# Patient Record
Sex: Female | Born: 1940
Health system: Southern US, Community
[De-identification: ages and names within clinical notes are randomized; demographics above are authoritative.]

## PROBLEM LIST (undated history)

## (undated) ENCOUNTER — Emergency Department (HOSPITAL_BASED_OUTPATIENT_CLINIC_OR_DEPARTMENT_OTHER): Admission: EM | Payer: Medicare Other | Source: Home / Self Care

## (undated) DIAGNOSIS — F419 Anxiety disorder, unspecified: Secondary | ICD-10-CM

## (undated) DIAGNOSIS — F329 Major depressive disorder, single episode, unspecified: Secondary | ICD-10-CM

## (undated) DIAGNOSIS — E785 Hyperlipidemia, unspecified: Secondary | ICD-10-CM

## (undated) DIAGNOSIS — R238 Other skin changes: Secondary | ICD-10-CM

## (undated) DIAGNOSIS — D510 Vitamin B12 deficiency anemia due to intrinsic factor deficiency: Secondary | ICD-10-CM

## (undated) DIAGNOSIS — F32A Depression, unspecified: Secondary | ICD-10-CM

## (undated) DIAGNOSIS — R011 Cardiac murmur, unspecified: Secondary | ICD-10-CM

## (undated) DIAGNOSIS — M8580 Other specified disorders of bone density and structure, unspecified site: Secondary | ICD-10-CM

## (undated) DIAGNOSIS — H409 Unspecified glaucoma: Secondary | ICD-10-CM

## (undated) DIAGNOSIS — L578 Other skin changes due to chronic exposure to nonionizing radiation: Secondary | ICD-10-CM

## (undated) DIAGNOSIS — C439 Malignant melanoma of skin, unspecified: Secondary | ICD-10-CM

## (undated) DIAGNOSIS — H18519 Endothelial corneal dystrophy, unspecified eye: Secondary | ICD-10-CM

## (undated) DIAGNOSIS — IMO0001 Reserved for inherently not codable concepts without codable children: Secondary | ICD-10-CM

## (undated) DIAGNOSIS — L301 Dyshidrosis [pompholyx]: Secondary | ICD-10-CM

## (undated) HISTORY — DX: Other skin changes due to chronic exposure to nonionizing radiation: L57.8

## (undated) HISTORY — PX: MASTECTOMY: SHX3

## (undated) HISTORY — DX: Other skin changes: R23.8

## (undated) HISTORY — PX: CORNEAL TRANSPLANT: SHX108

## (undated) HISTORY — DX: Malignant melanoma of skin, unspecified: C43.9

## (undated) HISTORY — DX: Anxiety disorder, unspecified: F41.9

## (undated) HISTORY — DX: Dyshidrosis (pompholyx): L30.1

## (undated) HISTORY — DX: Other specified disorders of bone density and structure, unspecified site: M85.80

## (undated) HISTORY — DX: Depression, unspecified: F32.A

## (undated) HISTORY — DX: Reserved for inherently not codable concepts without codable children: IMO0001

## (undated) HISTORY — DX: Hyperlipidemia, unspecified: E78.5

## (undated) HISTORY — DX: Endothelial corneal dystrophy, unspecified eye: H18.519

## (undated) HISTORY — DX: Vitamin B12 deficiency anemia due to intrinsic factor deficiency: D51.0

## (undated) HISTORY — DX: Cardiac murmur, unspecified: R01.1

## (undated) HISTORY — PX: PLACEMENT OF BREAST IMPLANTS: SHX6334

## (undated) HISTORY — DX: Unspecified glaucoma: H40.9

---
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
## 1997-10-24 ENCOUNTER — Other Ambulatory Visit: Admission: RE | Admit: 1997-10-24 | Discharge: 1997-10-24 | Payer: Self-pay | Admitting: Obstetrics and Gynecology

## 1999-07-02 ENCOUNTER — Other Ambulatory Visit: Admission: RE | Admit: 1999-07-02 | Discharge: 1999-07-02 | Payer: Self-pay | Admitting: Obstetrics and Gynecology

## 2000-08-15 ENCOUNTER — Other Ambulatory Visit: Admission: RE | Admit: 2000-08-15 | Discharge: 2000-08-15 | Payer: Self-pay | Admitting: Obstetrics and Gynecology

## 2005-01-11 ENCOUNTER — Ambulatory Visit: Payer: Self-pay | Admitting: Family Medicine

## 2005-01-14 ENCOUNTER — Ambulatory Visit: Payer: Self-pay | Admitting: Family Medicine

## 2006-01-05 ENCOUNTER — Ambulatory Visit: Payer: Self-pay | Admitting: Family Medicine

## 2006-01-05 ENCOUNTER — Encounter: Admission: RE | Admit: 2006-01-05 | Discharge: 2006-01-05 | Payer: Self-pay | Admitting: Family Medicine

## 2006-01-19 ENCOUNTER — Ambulatory Visit: Payer: Self-pay | Admitting: Family Medicine

## 2006-01-19 LAB — CONVERTED CEMR LAB
ALT: 12 units/L (ref 0–40)
AST: 21 units/L (ref 0–37)
Albumin: 4.2 g/dL (ref 3.5–5.2)
Alkaline Phosphatase: 58 units/L (ref 39–117)
BUN: 15 mg/dL (ref 6–23)
Basophils Absolute: 0 10*3/uL (ref 0.0–0.1)
Basophils Relative: 0.4 % (ref 0.0–1.0)
CO2: 30 meq/L (ref 19–32)
Calcium: 9.7 mg/dL (ref 8.4–10.5)
Chloride: 108 meq/L (ref 96–112)
Chol/HDL Ratio, serum: 3.6
Cholesterol: 220 mg/dL (ref 0–200)
Creatinine, Ser: 0.9 mg/dL (ref 0.4–1.2)
Eosinophil percent: 3.5 % (ref 0.0–5.0)
GFR calc non Af Amer: 67 mL/min
Glomerular Filtration Rate, Af Am: 81 mL/min/{1.73_m2}
Glucose, Bld: 96 mg/dL (ref 70–99)
HCT: 42.9 % (ref 36.0–46.0)
HDL: 60.7 mg/dL (ref >39.0)
Hemoglobin: 14.1 g/dL (ref 12.0–15.0)
LDL DIRECT: 140.3 mg/dL
Lymphocytes Relative: 29.5 % (ref 12.0–46.0)
MCHC: 32.9 g/dL (ref 30.0–36.0)
MCV: 87.3 fL (ref 78.0–100.0)
Monocytes Absolute: 0.5 10*3/uL (ref 0.2–0.7)
Monocytes Relative: 8.5 % (ref 3.0–11.0)
Neutro Abs: 3.6 10*3/uL (ref 1.4–7.7)
Neutrophils Relative %: 58.1 % (ref 43.0–77.0)
Platelets: 263 10*3/uL (ref 150–400)
Potassium: 4.8 meq/L (ref 3.5–5.1)
RBC: 4.92 M/uL (ref 3.87–5.11)
RDW: 12.7 % (ref 11.5–14.6)
Sodium: 143 meq/L (ref 135–145)
TSH: 1.08 microintl units/mL (ref 0.35–5.50)
Total Bilirubin: 0.7 mg/dL (ref 0.3–1.2)
Total Protein: 7 g/dL (ref 6.0–8.3)
Triglyceride fasting, serum: 113 mg/dL (ref 0–149)
VLDL: 23 mg/dL (ref 0–40)
WBC: 6.1 10*3/uL (ref 4.5–10.5)

## 2006-01-26 ENCOUNTER — Ambulatory Visit: Payer: Self-pay | Admitting: Family Medicine

## 2006-04-15 ENCOUNTER — Emergency Department (HOSPITAL_COMMUNITY): Admission: EM | Admit: 2006-04-15 | Discharge: 2006-04-15 | Payer: Self-pay | Admitting: Emergency Medicine

## 2006-04-25 ENCOUNTER — Ambulatory Visit: Payer: Self-pay | Admitting: Family Medicine

## 2006-05-10 ENCOUNTER — Ambulatory Visit: Payer: Self-pay | Admitting: Internal Medicine

## 2006-05-16 ENCOUNTER — Encounter: Payer: Self-pay | Admitting: Family Medicine

## 2006-05-20 ENCOUNTER — Ambulatory Visit: Payer: Self-pay | Admitting: Family Medicine

## 2006-05-24 ENCOUNTER — Encounter: Payer: Self-pay | Admitting: Family Medicine

## 2006-10-24 ENCOUNTER — Telehealth (INDEPENDENT_AMBULATORY_CARE_PROVIDER_SITE_OTHER): Payer: Self-pay | Admitting: *Deleted

## 2007-05-03 ENCOUNTER — Ambulatory Visit: Payer: Self-pay | Admitting: Family Medicine

## 2007-05-03 DIAGNOSIS — L301 Dyshidrosis [pompholyx]: Secondary | ICD-10-CM

## 2007-05-03 DIAGNOSIS — M949 Disorder of cartilage, unspecified: Secondary | ICD-10-CM

## 2007-05-03 DIAGNOSIS — L578 Other skin changes due to chronic exposure to nonionizing radiation: Secondary | ICD-10-CM | POA: Insufficient documentation

## 2007-05-03 DIAGNOSIS — M899 Disorder of bone, unspecified: Secondary | ICD-10-CM | POA: Insufficient documentation

## 2007-05-03 HISTORY — DX: Dyshidrosis (pompholyx): L30.1

## 2007-05-03 LAB — CONVERTED CEMR LAB
ALT: 12 units/L (ref 0–35)
AST: 23 units/L (ref 0–37)
Albumin: 4.4 g/dL (ref 3.5–5.2)
Alkaline Phosphatase: 65 units/L (ref 39–117)
BUN: 14 mg/dL (ref 6–23)
Basophils Absolute: 0.1 10*3/uL (ref 0.0–0.1)
Basophils Relative: 1.2 % — ABNORMAL HIGH (ref 0.0–1.0)
Bilirubin Urine: NEGATIVE
Bilirubin, Direct: 0.1 mg/dL (ref 0.0–0.3)
Blood in Urine, dipstick: NEGATIVE
CO2: 31 meq/L (ref 19–32)
Calcium: 9.6 mg/dL (ref 8.4–10.5)
Chloride: 109 meq/L (ref 96–112)
Cholesterol: 247 mg/dL (ref 0–200)
Creatinine, Ser: 0.8 mg/dL (ref 0.4–1.2)
Direct LDL: 161.7 mg/dL
Eosinophils Absolute: 0.2 10*3/uL (ref 0.0–0.6)
Eosinophils Relative: 3.5 % (ref 0.0–5.0)
GFR calc Af Amer: 93 mL/min
GFR calc non Af Amer: 77 mL/min
Glucose, Bld: 87 mg/dL (ref 70–99)
Glucose, Urine, Semiquant: NEGATIVE
HCT: 41.6 % (ref 36.0–46.0)
HDL: 62.6 mg/dL (ref >39.0)
Hemoglobin: 13.7 g/dL (ref 12.0–15.0)
Ketones, urine, test strip: NEGATIVE
Lymphocytes Relative: 27.8 % (ref 12.0–46.0)
MCHC: 33 g/dL (ref 30.0–36.0)
MCV: 86.4 fL (ref 78.0–100.0)
Monocytes Absolute: 0.4 10*3/uL (ref 0.2–0.7)
Monocytes Relative: 8.5 % (ref 3.0–11.0)
Neutro Abs: 2.8 10*3/uL (ref 1.4–7.7)
Neutrophils Relative %: 59 % (ref 43.0–77.0)
Nitrite: NEGATIVE
Platelets: 260 10*3/uL (ref 150–400)
Potassium: 5.7 meq/L — ABNORMAL HIGH (ref 3.5–5.1)
Protein, U semiquant: NEGATIVE
RBC: 4.81 M/uL (ref 3.87–5.11)
RDW: 12.8 % (ref 11.5–14.6)
Sodium: 144 meq/L (ref 135–145)
Specific Gravity, Urine: 1.02
TSH: 1.65 microintl units/mL (ref 0.35–5.50)
Total Bilirubin: 0.7 mg/dL (ref 0.3–1.2)
Total CHOL/HDL Ratio: 3.9
Total Protein: 6.9 g/dL (ref 6.0–8.3)
Triglycerides: 95 mg/dL (ref 0–149)
Urobilinogen, UA: 0.2
VLDL: 19 mg/dL (ref 0–40)
WBC Urine, dipstick: NEGATIVE
WBC: 4.9 10*3/uL (ref 4.5–10.5)
pH: 7

## 2007-05-04 ENCOUNTER — Encounter: Payer: Self-pay | Admitting: Family Medicine

## 2007-05-04 ENCOUNTER — Ambulatory Visit: Payer: Self-pay | Admitting: Internal Medicine

## 2007-06-07 ENCOUNTER — Encounter: Payer: Self-pay | Admitting: Family Medicine

## 2007-06-12 ENCOUNTER — Ambulatory Visit: Payer: Self-pay | Admitting: Internal Medicine

## 2007-06-25 ENCOUNTER — Telehealth: Payer: Self-pay | Admitting: *Deleted

## 2007-06-26 ENCOUNTER — Ambulatory Visit: Payer: Self-pay | Admitting: Internal Medicine

## 2007-08-16 ENCOUNTER — Telehealth: Payer: Self-pay | Admitting: Internal Medicine

## 2007-08-16 ENCOUNTER — Ambulatory Visit: Payer: Self-pay | Admitting: Internal Medicine

## 2007-08-16 DIAGNOSIS — M79609 Pain in unspecified limb: Secondary | ICD-10-CM | POA: Insufficient documentation

## 2007-08-19 LAB — HM COLONOSCOPY: HM Colonoscopy: NORMAL

## 2008-03-12 ENCOUNTER — Telehealth: Payer: Self-pay | Admitting: Internal Medicine

## 2008-03-20 ENCOUNTER — Telehealth: Payer: Self-pay | Admitting: Internal Medicine

## 2008-05-06 ENCOUNTER — Ambulatory Visit: Payer: Self-pay | Admitting: Family Medicine

## 2008-05-27 ENCOUNTER — Ambulatory Visit: Payer: Self-pay | Admitting: Family Medicine

## 2008-05-27 DIAGNOSIS — H6120 Impacted cerumen, unspecified ear: Secondary | ICD-10-CM | POA: Insufficient documentation

## 2008-08-07 ENCOUNTER — Encounter: Payer: Self-pay | Admitting: Family Medicine

## 2009-08-18 LAB — HM MAMMOGRAPHY: HM Mammogram: NORMAL

## 2009-08-27 ENCOUNTER — Encounter: Payer: Self-pay | Admitting: Family Medicine

## 2009-09-23 ENCOUNTER — Ambulatory Visit: Payer: Self-pay | Admitting: Family Medicine

## 2009-09-23 ENCOUNTER — Other Ambulatory Visit: Admission: RE | Admit: 2009-09-23 | Discharge: 2009-09-23 | Payer: Self-pay | Admitting: Family Medicine

## 2009-09-23 LAB — CONVERTED CEMR LAB
Bilirubin Urine: NEGATIVE
Blood in Urine, dipstick: NEGATIVE
Glucose, Urine, Semiquant: NEGATIVE
Ketones, urine, test strip: NEGATIVE
Nitrite: NEGATIVE
Pap Smear: NEGATIVE
Protein, U semiquant: NEGATIVE
Specific Gravity, Urine: 1.02
Urobilinogen, UA: 0.2
WBC Urine, dipstick: NEGATIVE
pH: 6.5

## 2009-09-29 LAB — CONVERTED CEMR LAB
ALT: 15 units/L (ref 0–35)
AST: 23 units/L (ref 0–37)
Albumin: 4.3 g/dL (ref 3.5–5.2)
Alkaline Phosphatase: 66 units/L (ref 39–117)
BUN: 19 mg/dL (ref 6–23)
Basophils Absolute: 0 10*3/uL (ref 0.0–0.1)
Basophils Relative: 0.6 % (ref 0.0–3.0)
Bilirubin, Direct: 0.1 mg/dL (ref 0.0–0.3)
CO2: 29 meq/L (ref 19–32)
Calcium: 9.2 mg/dL (ref 8.4–10.5)
Chloride: 104 meq/L (ref 96–112)
Cholesterol: 239 mg/dL — ABNORMAL HIGH (ref 0–200)
Creatinine, Ser: 0.7 mg/dL (ref 0.4–1.2)
Direct LDL: 159.9 mg/dL
Eosinophils Absolute: 0.1 10*3/uL (ref 0.0–0.7)
Eosinophils Relative: 2.3 % (ref 0.0–5.0)
GFR calc non Af Amer: 93.01 mL/min (ref >60)
Glucose, Bld: 85 mg/dL (ref 70–99)
HCT: 41.1 % (ref 36.0–46.0)
HDL: 67.7 mg/dL (ref >39.00)
Hemoglobin: 13.8 g/dL (ref 12.0–15.0)
Lymphocytes Relative: 22.8 % (ref 12.0–46.0)
Lymphs Abs: 1.2 10*3/uL (ref 0.7–4.0)
MCHC: 33.5 g/dL (ref 30.0–36.0)
MCV: 86.1 fL (ref 78.0–100.0)
Monocytes Absolute: 0.5 10*3/uL (ref 0.1–1.0)
Monocytes Relative: 9.4 % (ref 3.0–12.0)
Neutro Abs: 3.4 10*3/uL (ref 1.4–7.7)
Neutrophils Relative %: 64.9 % (ref 43.0–77.0)
Platelets: 251 10*3/uL (ref 150.0–400.0)
Potassium: 4.2 meq/L (ref 3.5–5.1)
RBC: 4.77 M/uL (ref 3.87–5.11)
RDW: 13.8 % (ref 11.5–14.6)
Sodium: 143 meq/L (ref 135–145)
TSH: 1.67 microintl units/mL (ref 0.35–5.50)
Total Bilirubin: 0.5 mg/dL (ref 0.3–1.2)
Total CHOL/HDL Ratio: 4
Total Protein: 6.9 g/dL (ref 6.0–8.3)
Triglycerides: 89 mg/dL (ref 0.0–149.0)
VLDL: 17.8 mg/dL (ref 0.0–40.0)
WBC: 5.3 10*3/uL (ref 4.5–10.5)

## 2009-10-19 LAB — HM PAP SMEAR

## 2009-11-28 ENCOUNTER — Encounter: Payer: Self-pay | Admitting: Family Medicine

## 2009-12-23 ENCOUNTER — Ambulatory Visit: Payer: Self-pay | Admitting: Family Medicine

## 2009-12-26 DIAGNOSIS — L820 Inflamed seborrheic keratosis: Secondary | ICD-10-CM | POA: Insufficient documentation

## 2010-03-08 LAB — CONVERTED CEMR LAB
ALT: 22 units/L (ref 0–35)
AST: 27 units/L (ref 0–37)
Albumin: 4.1 g/dL (ref 3.5–5.2)
Alkaline Phosphatase: 73 units/L (ref 39–117)
BUN: 17 mg/dL (ref 6–23)
Basophils Absolute: 0 10*3/uL (ref 0.0–0.1)
Basophils Relative: 0.4 % (ref 0.0–3.0)
Bilirubin, Direct: 0.1 mg/dL (ref 0.0–0.3)
CO2: 30 meq/L (ref 19–32)
Calcium: 10 mg/dL (ref 8.4–10.5)
Chloride: 107 meq/L (ref 96–112)
Cholesterol: 229 mg/dL — ABNORMAL HIGH (ref 0–200)
Creatinine, Ser: 0.6 mg/dL (ref 0.4–1.2)
Direct LDL: 157.2 mg/dL
Eosinophils Absolute: 0.1 10*3/uL (ref 0.0–0.7)
Eosinophils Relative: 2.4 % (ref 0.0–5.0)
GFR calc non Af Amer: 106.08 mL/min (ref >60)
Glucose, Bld: 80 mg/dL (ref 70–99)
HCT: 40.9 % (ref 36.0–46.0)
HDL: 52 mg/dL (ref >39.00)
Hemoglobin: 14.2 g/dL (ref 12.0–15.0)
Lymphocytes Relative: 27.4 % (ref 12.0–46.0)
Lymphs Abs: 1.3 10*3/uL (ref 0.7–4.0)
MCHC: 34.6 g/dL (ref 30.0–36.0)
MCV: 85.2 fL (ref 78.0–100.0)
Monocytes Absolute: 0.3 10*3/uL (ref 0.1–1.0)
Monocytes Relative: 7.2 % (ref 3.0–12.0)
Neutro Abs: 3 10*3/uL (ref 1.4–7.7)
Neutrophils Relative %: 62.6 % (ref 43.0–77.0)
Platelets: 243 10*3/uL (ref 150.0–400.0)
Potassium: 4.5 meq/L (ref 3.5–5.1)
RBC: 4.8 M/uL (ref 3.87–5.11)
RDW: 12.2 % (ref 11.5–14.6)
Sodium: 144 meq/L (ref 135–145)
TSH: 1.08 microintl units/mL (ref 0.35–5.50)
Total Bilirubin: 0.6 mg/dL (ref 0.3–1.2)
Total CHOL/HDL Ratio: 4
Total Protein: 7.2 g/dL (ref 6.0–8.3)
Triglycerides: 92 mg/dL (ref 0.0–149.0)
VLDL: 18.4 mg/dL (ref 0.0–40.0)
WBC: 4.7 10*3/uL (ref 4.5–10.5)

## 2010-03-12 NOTE — Assessment & Plan Note (Signed)
Summary: mole removal on chest/cjr   Vital Signs:  Patient profile:   70 year old female Menstrual status:  postmenopausal BP sitting:   130 / 90  (left arm) Cuff size:   regular  Vitals Entered By: Alfred Levins, [name] (December 23, 2009 2:30 PM)  Procedure Note Last Tetanus: Historical (02/09/2000)  Mole Biopsy/Removal: Indication: rule out cancer Consent signed: yes  Procedure # 1: elliptical incision with 2 mm margin    Size (in cm): 0.7 x 0.7    Region: anterior    Location: chest-right    Instrument used: #15 blade    Anesthesia: 1% lidocaine w/epinephrine    Closure: cautery  Cleaned and prepped with: alcohol Wound dressing: bandaid  CC: remove mole between breast   CC:  remove mole between breast.  History of Present Illness: Carmen [name] is a 70 year old female, who comes in today for removal of a lesion on her anterior chest wall.  She said the skin cancers removed in the past  Allergies: 1)  ! Sulfa   Complete Medication List: 1)  Calcitrate 950 Mg Tabs (Calcium citrate) 2)  Adult Aspirin Low Strength 81 Mg Tbdp (Aspirin) 3)  Multivitamins Tabs (Multiple vitamin) .... Take one tab by mouth once daily 4)  Lumigan 0.03 % Soln (Bimatoprost) .... Use as directed 5)  Triamcinolone Acetonide 0.1 % Oint (Triamcinolone acetonide) .... Apply two times a day as needed 6)  Premarin 0.625 Mg/gm Crea (Estrogens, conjugated) .... Apply  2 x week  Other Orders: Shave Skin Lesion 0.6-1.0 cm/trunk/arm/leg (11301)   Orders Added: 1)  Shave Skin Lesion 0.6-1.0 cm/trunk/arm/leg [11301]

## 2010-03-12 NOTE — Consult Note (Signed)
Summary: [facility]  [facility]   Imported By: Maryln Gottron 07/25/2009 14:46:49  _____________________________________________________________________  External Attachment:    Type:   Image     Comment:   External Document

## 2010-03-12 NOTE — Miscellaneous (Signed)
Summary: mammogram update  Clinical Lists Changes  Observations: Added new observation of MAMMOGRAM: normal (08/13/2009 8:41)      Preventive Care Screening  Mammogram:    Date:  08/13/2009    Results:  normal
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
## 2010-03-12 NOTE — Miscellaneous (Signed)
Summary: flu vaccine  Clinical Lists Changes  Observations: Added new observation of FLU VAX: Historical (11/15/2009 11:00)      Immunization History:  Influenza Immunization History:    Influenza:  historical (11/15/2009)

## 2010-09-28 ENCOUNTER — Encounter: Payer: Self-pay | Admitting: Family Medicine

## 2010-10-19 ENCOUNTER — Other Ambulatory Visit: Payer: Self-pay | Admitting: Dermatology

## 2010-10-20 ENCOUNTER — Encounter: Payer: Self-pay | Admitting: Family Medicine

## 2010-10-20 ENCOUNTER — Ambulatory Visit (INDEPENDENT_AMBULATORY_CARE_PROVIDER_SITE_OTHER): Payer: Medicare Other | Admitting: Family Medicine

## 2010-10-20 VITALS — BP 124/84 | Temp 98.1°F | Ht 61.75 in | Wt 126.0 lb

## 2010-10-20 DIAGNOSIS — Z0000 Encounter for general adult medical examination without abnormal findings: Secondary | ICD-10-CM

## 2010-10-20 DIAGNOSIS — L578 Other skin changes due to chronic exposure to nonionizing radiation: Secondary | ICD-10-CM

## 2010-10-20 DIAGNOSIS — M899 Disorder of bone, unspecified: Secondary | ICD-10-CM

## 2010-10-20 DIAGNOSIS — Z23 Encounter for immunization: Secondary | ICD-10-CM

## 2010-10-20 DIAGNOSIS — Z136 Encounter for screening for cardiovascular disorders: Secondary | ICD-10-CM

## 2010-10-20 DIAGNOSIS — M949 Disorder of cartilage, unspecified: Secondary | ICD-10-CM

## 2010-10-20 DIAGNOSIS — H409 Unspecified glaucoma: Secondary | ICD-10-CM

## 2010-10-20 LAB — POCT URINALYSIS DIPSTICK
Bilirubin, UA: NEGATIVE
Glucose, UA: NEGATIVE
Ketones, UA: NEGATIVE
Nitrite, UA: POSITIVE
Protein, UA: NEGATIVE
Spec Grav, UA: 1.02
Urobilinogen, UA: 0.2
pH, UA: 7

## 2010-10-20 LAB — CBC WITH DIFFERENTIAL/PLATELET
Basophils Absolute: 0 10*3/uL (ref 0.0–0.1)
Basophils Relative: 0.9 % (ref 0.0–3.0)
Eosinophils Absolute: 0.1 10*3/uL (ref 0.0–0.7)
Eosinophils Relative: 2.5 % (ref 0.0–5.0)
HCT: 42.8 % (ref 36.0–46.0)
Hemoglobin: 14 g/dL (ref 12.0–15.0)
Lymphocytes Relative: 25.9 % (ref 12.0–46.0)
Lymphs Abs: 1.4 10*3/uL (ref 0.7–4.0)
MCHC: 32.8 g/dL (ref 30.0–36.0)
MCV: 86 fl (ref 78.0–100.0)
Monocytes Absolute: 0.5 10*3/uL (ref 0.1–1.0)
Monocytes Relative: 8.2 % (ref 3.0–12.0)
Neutro Abs: 3.5 10*3/uL (ref 1.4–7.7)
Neutrophils Relative %: 62.5 % (ref 43.0–77.0)
Platelets: 253 10*3/uL (ref 150.0–400.0)
RBC: 4.98 Mil/uL (ref 3.87–5.11)
RDW: 13.8 % (ref 11.5–14.6)
WBC: 5.6 10*3/uL (ref 4.5–10.5)

## 2010-10-20 LAB — TSH: TSH: 1.41 u[IU]/mL (ref 0.35–5.50)

## 2010-10-20 MED ORDER — ESTROGENS, CONJUGATED 0.625 MG/GM VA CREA: 0.6250 g | TOPICAL_CREAM | Freq: Every day | VAGINAL | Status: DC

## 2010-10-20 NOTE — Progress Notes (Signed)
Subjective:    Patient ID: Carmen [name], female    DOB: Apr 30, 1940, 70 y.o.   MRN: [id number]  HPIBecky is a 70 year old, married female, nonsmoker retired Engineer, civil (consulting).  G3, P3, who comes in today for a Medicare wellness examination because of the history of chronic sun damage, postmenopausal vaginal dryness, eczema, and a new diagnosis of glaucoma.  She takes an 81-mg baby aspirin daily, calcium, vitamin D.  Her last bone density was 2000 and 10 at which time she had osteopenia.  She got off the Fosamax at that point for about 3 years.  She did take Fosamax for 3 years plus.  Prior  She was using the Premarin vaginal cream, but stopped taking it because of the concern about breast cancer.  She's had bilateral mastectomies with implants.  When she was in her 9s.  Because of positive family history of breast cancer, and all the women in her family.  Uses Kenalog cream p.r.n. For eczema.  Recently started on drops by her ophthalmologist for glaucoma.  She gets routine eye care, hearing normal, regular dental care, BSE monthly, recent mammogram normal, colonoscopy, normal, tetanus, 2002, booster today, flu shot today, information given on shingles, Pneumovax 2009,  Activities of daily living, normal.  She walks on a daily basis.  Cognitive function, normal.  On how safety reviewed.  No issues identified, no guns in the house, she does have a healthcare power of attorney and living will.  Recent skin exam two lesions removed by dermatologist path is pending.  We also discussed Pap smears every 3 years addition, she has been asymptomatic, and no history of any cervical or uterine or ovarian problems.    Review of Systems  Constitutional: Negative.   HENT: Negative.   Eyes: Negative.   Respiratory: Negative.   Cardiovascular: Negative.   Gastrointestinal: Negative.   Genitourinary: Negative.   Musculoskeletal: Negative.   Neurological: Negative.   Hematological: Negative.     Psychiatric/Behavioral: Negative.        Objective:   Physical Exam  Constitutional: She appears well-developed and well-nourished.  HENT:  Head: Normocephalic and atraumatic.  Right Ear: External ear normal.  Left Ear: External ear normal.  Nose: Nose normal.  Mouth/Throat: Oropharynx is clear and moist.  Eyes: EOM are normal. Pupils are equal, round, and reactive to light.  Neck: Normal range of motion. Neck supple. No thyromegaly present.  Cardiovascular: Normal rate, regular rhythm, normal heart sounds and intact distal pulses.  Exam reveals no gallop and no friction rub.   No murmur heard. Pulmonary/Chest: Effort normal and breath sounds normal.  Abdominal: Soft. Bowel sounds are normal. She exhibits no distension and no mass. There is no tenderness. There is no rebound.  Genitourinary: Vagina normal and uterus normal. Guaiac negative stool. No vaginal discharge found.       Bilateral breast exam shows the scars at the sixth clock positions bilaterally from previous implants implants are palpable.  No palpable masses  Musculoskeletal: Normal range of motion.  Lymphadenopathy:    She has no cervical adenopathy.  Neurological: She is alert. She has normal reflexes. No cranial nerve deficit. She exhibits normal muscle tone. Coordination normal.  Skin: Skin is warm and dry.       Total body skin exam shows no abnormal appearing lesions.  Psychiatric: She has a normal mood and affect. Her behavior is normal. Judgment and thought content normal.          Assessment & Plan:  Healthy female.  History of osteopenia.  Repeat bone density.  History of glaucoma followed by ophthalmologist.  Vaginal dryness uses Premarin vaginal cream.  History of eczema.  Triamcinolone cream p.r.n.  Return in one year, sooner if any problems

## 2010-10-21 LAB — LIPID PANEL
Cholesterol: 244 mg/dL — ABNORMAL HIGH (ref 0–200)
HDL: 72.9 mg/dL (ref >39.00)
Total CHOL/HDL Ratio: 3
Triglycerides: 57 mg/dL (ref 0.0–149.0)
VLDL: 11.4 mg/dL (ref 0.0–40.0)

## 2010-10-21 LAB — BASIC METABOLIC PANEL
BUN: 21 mg/dL (ref 6–23)
CO2: 26 mEq/L (ref 19–32)
Calcium: 9.1 mg/dL (ref 8.4–10.5)
Chloride: 106 mEq/L (ref 96–112)
Creatinine, Ser: 0.8 mg/dL (ref 0.4–1.2)
GFR: 81.4 mL/min (ref >60.00)
Glucose, Bld: 104 mg/dL — ABNORMAL HIGH (ref 70–99)
Potassium: 4.4 mEq/L (ref 3.5–5.1)
Sodium: 142 mEq/L (ref 135–145)

## 2010-10-21 LAB — HEPATIC FUNCTION PANEL
ALT: 12 U/L (ref 0–35)
AST: 23 U/L (ref 0–37)
Albumin: 4.5 g/dL (ref 3.5–5.2)
Alkaline Phosphatase: 70 U/L (ref 39–117)
Bilirubin, Direct: 0 mg/dL (ref 0.0–0.3)
Total Bilirubin: 0.6 mg/dL (ref 0.3–1.2)
Total Protein: 7.4 g/dL (ref 6.0–8.3)

## 2010-10-21 LAB — LDL CHOLESTEROL, DIRECT: Direct LDL: 170.4 mg/dL

## 2010-10-22 LAB — VITAMIN D 1,25 DIHYDROXY
Vitamin D 1, 25 (OH)2 Total: 63 pg/mL (ref 18–72)
Vitamin D2 1, 25 (OH)2: <8 pg/mL
Vitamin D3 1, 25 (OH)2: 63 pg/mL

## 2010-12-14 ENCOUNTER — Ambulatory Visit (INDEPENDENT_AMBULATORY_CARE_PROVIDER_SITE_OTHER)
Admission: RE | Admit: 2010-12-14 | Discharge: 2010-12-14 | Disposition: A | Discharge status: Home / Self Care | Payer: Medicare Other | Source: Ambulatory Visit | Attending: Family Medicine | Admitting: Family Medicine

## 2010-12-14 DIAGNOSIS — M899 Disorder of bone, unspecified: Secondary | ICD-10-CM

## 2010-12-14 DIAGNOSIS — M949 Disorder of cartilage, unspecified: Secondary | ICD-10-CM

## 2010-12-22 ENCOUNTER — Encounter: Payer: Self-pay | Admitting: Family Medicine

## 2011-03-09 DIAGNOSIS — Z09 Encounter for follow-up examination after completed treatment for conditions other than malignant neoplasm: Secondary | ICD-10-CM | POA: Diagnosis not present

## 2011-03-09 DIAGNOSIS — N63 Unspecified lump in unspecified breast: Secondary | ICD-10-CM | POA: Diagnosis not present

## 2011-03-09 DIAGNOSIS — Z803 Family history of malignant neoplasm of breast: Secondary | ICD-10-CM | POA: Diagnosis not present

## 2011-03-10 DIAGNOSIS — H18509 Unspecified hereditary corneal dystrophies, unspecified eye: Secondary | ICD-10-CM | POA: Diagnosis not present

## 2011-03-10 DIAGNOSIS — H4010X0 Unspecified open-angle glaucoma, stage unspecified: Secondary | ICD-10-CM | POA: Diagnosis not present

## 2011-08-09 ENCOUNTER — Other Ambulatory Visit: Payer: Self-pay | Admitting: Dermatology

## 2011-08-09 DIAGNOSIS — L821 Other seborrheic keratosis: Secondary | ICD-10-CM | POA: Diagnosis not present

## 2011-08-09 DIAGNOSIS — L570 Actinic keratosis: Secondary | ICD-10-CM | POA: Diagnosis not present

## 2011-08-09 DIAGNOSIS — C44721 Squamous cell carcinoma of skin of unspecified lower limb, including hip: Secondary | ICD-10-CM | POA: Diagnosis not present

## 2011-08-09 DIAGNOSIS — L578 Other skin changes due to chronic exposure to nonionizing radiation: Secondary | ICD-10-CM | POA: Diagnosis not present

## 2011-08-09 DIAGNOSIS — D0470 Carcinoma in situ of skin of unspecified lower limb, including hip: Secondary | ICD-10-CM | POA: Diagnosis not present

## 2011-08-09 DIAGNOSIS — D239 Other benign neoplasm of skin, unspecified: Secondary | ICD-10-CM | POA: Diagnosis not present

## 2011-08-09 DIAGNOSIS — D485 Neoplasm of uncertain behavior of skin: Secondary | ICD-10-CM | POA: Diagnosis not present

## 2011-09-13 DIAGNOSIS — Z09 Encounter for follow-up examination after completed treatment for conditions other than malignant neoplasm: Secondary | ICD-10-CM | POA: Diagnosis not present

## 2011-09-13 DIAGNOSIS — N6019 Diffuse cystic mastopathy of unspecified breast: Secondary | ICD-10-CM | POA: Diagnosis not present

## 2011-09-14 DIAGNOSIS — H52209 Unspecified astigmatism, unspecified eye: Secondary | ICD-10-CM | POA: Diagnosis not present

## 2011-09-14 DIAGNOSIS — H4011X0 Primary open-angle glaucoma, stage unspecified: Secondary | ICD-10-CM | POA: Diagnosis not present

## 2011-09-14 DIAGNOSIS — H18519 Endothelial corneal dystrophy, unspecified eye: Secondary | ICD-10-CM | POA: Diagnosis not present

## 2011-09-14 DIAGNOSIS — H409 Unspecified glaucoma: Secondary | ICD-10-CM | POA: Diagnosis not present

## 2011-10-25 ENCOUNTER — Ambulatory Visit (INDEPENDENT_AMBULATORY_CARE_PROVIDER_SITE_OTHER): Payer: Medicare Other | Admitting: Family Medicine

## 2011-10-25 ENCOUNTER — Encounter: Payer: Self-pay | Admitting: Family Medicine

## 2011-10-25 ENCOUNTER — Other Ambulatory Visit (HOSPITAL_COMMUNITY)
Admission: RE | Admit: 2011-10-25 | Discharge: 2011-10-25 | Disposition: A | Discharge status: Home / Self Care | Payer: Medicare Other | Source: Ambulatory Visit | Attending: Family Medicine | Admitting: Family Medicine

## 2011-10-25 ENCOUNTER — Other Ambulatory Visit: Payer: Self-pay | Admitting: Dermatology

## 2011-10-25 VITALS — BP 120/80 | Temp 98.1°F | Ht 61.75 in | Wt 130.0 lb

## 2011-10-25 DIAGNOSIS — Z23 Encounter for immunization: Secondary | ICD-10-CM | POA: Diagnosis not present

## 2011-10-25 DIAGNOSIS — Z0000 Encounter for general adult medical examination without abnormal findings: Secondary | ICD-10-CM | POA: Diagnosis not present

## 2011-10-25 DIAGNOSIS — L570 Actinic keratosis: Secondary | ICD-10-CM | POA: Diagnosis not present

## 2011-10-25 DIAGNOSIS — D0470 Carcinoma in situ of skin of unspecified lower limb, including hip: Secondary | ICD-10-CM | POA: Diagnosis not present

## 2011-10-25 DIAGNOSIS — L905 Scar conditions and fibrosis of skin: Secondary | ICD-10-CM | POA: Diagnosis not present

## 2011-10-25 DIAGNOSIS — D485 Neoplasm of uncertain behavior of skin: Secondary | ICD-10-CM | POA: Diagnosis not present

## 2011-10-25 DIAGNOSIS — L578 Other skin changes due to chronic exposure to nonionizing radiation: Secondary | ICD-10-CM

## 2011-10-25 DIAGNOSIS — L821 Other seborrheic keratosis: Secondary | ICD-10-CM | POA: Diagnosis not present

## 2011-10-25 DIAGNOSIS — H409 Unspecified glaucoma: Secondary | ICD-10-CM

## 2011-10-25 DIAGNOSIS — L819 Disorder of pigmentation, unspecified: Secondary | ICD-10-CM | POA: Diagnosis not present

## 2011-10-25 DIAGNOSIS — L301 Dyshidrosis [pompholyx]: Secondary | ICD-10-CM | POA: Diagnosis not present

## 2011-10-25 DIAGNOSIS — Z85828 Personal history of other malignant neoplasm of skin: Secondary | ICD-10-CM | POA: Diagnosis not present

## 2011-10-25 DIAGNOSIS — C4359 Malignant melanoma of other part of trunk: Secondary | ICD-10-CM | POA: Diagnosis not present

## 2011-10-25 DIAGNOSIS — D239 Other benign neoplasm of skin, unspecified: Secondary | ICD-10-CM | POA: Diagnosis not present

## 2011-10-25 DIAGNOSIS — Z124 Encounter for screening for malignant neoplasm of cervix: Secondary | ICD-10-CM | POA: Insufficient documentation

## 2011-10-25 LAB — BASIC METABOLIC PANEL
BUN: 22 mg/dL (ref 6–23)
CO2: 27 mEq/L (ref 19–32)
Calcium: 9.1 mg/dL (ref 8.4–10.5)
Chloride: 105 mEq/L (ref 96–112)
Creatinine, Ser: 0.8 mg/dL (ref 0.4–1.2)
GFR: 76.43 mL/min (ref >60.00)
Glucose, Bld: 96 mg/dL (ref 70–99)
Potassium: 4.3 mEq/L (ref 3.5–5.1)
Sodium: 140 mEq/L (ref 135–145)

## 2011-10-25 LAB — HEPATIC FUNCTION PANEL
ALT: 17 U/L (ref 0–35)
AST: 23 U/L (ref 0–37)
Albumin: 4.3 g/dL (ref 3.5–5.2)
Alkaline Phosphatase: 71 U/L (ref 39–117)
Bilirubin, Direct: 0 mg/dL (ref 0.0–0.3)
Total Bilirubin: 0.7 mg/dL (ref 0.3–1.2)
Total Protein: 7.3 g/dL (ref 6.0–8.3)

## 2011-10-25 LAB — POCT URINALYSIS DIPSTICK
Bilirubin, UA: NEGATIVE
Blood, UA: NEGATIVE
Glucose, UA: NEGATIVE
Ketones, UA: NEGATIVE
Nitrite, UA: POSITIVE
Protein, UA: NEGATIVE
Spec Grav, UA: 1.015
Urobilinogen, UA: 0.2
pH, UA: 7

## 2011-10-25 MED ORDER — TRIAMCINOLONE ACETONIDE 0.1 % EX CREA: 1.0000 "application " | TOPICAL_CREAM | Freq: Two times a day (BID) | CUTANEOUS | Status: DC

## 2011-10-25 NOTE — Patient Instructions (Addendum)
Continue your good health habits  Continue the SPF 50+ screenings  Return in one year sooner if any problems  I would recommend that you and your daughter and  go for genetic counseling because of the family history of breast cancer

## 2011-10-25 NOTE — Progress Notes (Signed)
  Subjective:    Patient ID: Carmen [name], female    DOB: 07/11/1940, 71 y.o.   MRN: [id number]  HPI Kriste Basque is a 71 year old married female nonsmoker retired Engineer, civil (consulting) who comes in today for a Medicare wellness examination  She has a history of mild glaucoma for which she uses eyedrops from her ophthalmologist  Is uses a steroid cream when necessary for some dyshidrotic eczema  And she gets a thorough skin exam by her dermatologist yearly because of chronic sun damage.  She gets routine eye care, dental care, BSE monthly, and you mammography, recent colonoscopy normal, tetanus 2012, Pneumovax 2009, seasonal flu shot today, information given on shingles cognitive function normal she walks on a regular basis home health safety reviewed no issues identified, no guns in the house, she does have a health care power of attorney and living well  She had a bilateral mastectomy with implants years ago because all the women in her family have breast cancer. We discussed her 2 daughters in terms of risk and the bracca gene . I recommend that she and her daughter go for genetic counseling   Review of Systems  Constitutional: Negative.   HENT: Negative.   Eyes: Negative.   Respiratory: Negative.   Cardiovascular: Negative.   Gastrointestinal: Negative.   Genitourinary: Negative.   Musculoskeletal: Negative.   Neurological: Negative.   Hematological: Negative.   Psychiatric/Behavioral: Negative.        Objective:   Physical Exam  Constitutional: She appears well-developed and well-nourished.  HENT:  Head: Normocephalic and atraumatic.  Right Ear: External ear normal.  Left Ear: External ear normal.  Nose: Nose normal.  Mouth/Throat: Oropharynx is clear and moist.  Eyes: EOM are normal. Pupils are equal, round, and reactive to light.  Neck: Normal range of motion. Neck supple. No thyromegaly present.  Cardiovascular: Normal rate, regular rhythm, normal heart sounds and intact distal pulses.   Exam reveals no gallop and no friction rub.   No murmur heard. Pulmonary/Chest: Effort normal and breath sounds normal.  Abdominal: Soft. Bowel sounds are normal. She exhibits no distension and no mass. There is no tenderness. There is no rebound.  Genitourinary: Vagina normal and uterus normal. Guaiac negative stool. No vaginal discharge found.       Bilateral breast exam shows scars from previous mastectomy and implants no palpable masses except the implants  Musculoskeletal: Normal range of motion.  Lymphadenopathy:    She has no cervical adenopathy.  Neurological: She is alert. She has normal reflexes. No cranial nerve deficit. She exhibits normal muscle tone. Coordination normal.  Skin: Skin is warm and dry.  Psychiatric: She has a normal mood and affect. Her behavior is normal. Judgment and thought content normal.          Assessment & Plan:  Healthy female  Chronic sun damage continue sunscreens and regular dermatologic followup  Glaucoma continue eyedrops  Status post mastectomy and implants because of family history of breast cancer recommend genetic counseling for she and her daughter

## 2011-11-03 ENCOUNTER — Telehealth: Payer: Self-pay | Admitting: Family Medicine

## 2011-11-03 NOTE — Telephone Encounter (Signed)
Pt would like pap and blood work results

## 2011-11-15 ENCOUNTER — Other Ambulatory Visit: Payer: Self-pay | Admitting: Dermatology

## 2011-11-15 DIAGNOSIS — D485 Neoplasm of uncertain behavior of skin: Secondary | ICD-10-CM | POA: Diagnosis not present

## 2011-11-15 DIAGNOSIS — C4359 Malignant melanoma of other part of trunk: Secondary | ICD-10-CM | POA: Diagnosis not present

## 2012-02-18 DIAGNOSIS — L259 Unspecified contact dermatitis, unspecified cause: Secondary | ICD-10-CM | POA: Diagnosis not present

## 2012-02-18 DIAGNOSIS — Z85828 Personal history of other malignant neoplasm of skin: Secondary | ICD-10-CM | POA: Diagnosis not present

## 2012-02-18 DIAGNOSIS — D235 Other benign neoplasm of skin of trunk: Secondary | ICD-10-CM | POA: Diagnosis not present

## 2012-02-18 DIAGNOSIS — L821 Other seborrheic keratosis: Secondary | ICD-10-CM | POA: Diagnosis not present

## 2012-02-18 DIAGNOSIS — L909 Atrophic disorder of skin, unspecified: Secondary | ICD-10-CM | POA: Diagnosis not present

## 2012-02-18 DIAGNOSIS — L919 Hypertrophic disorder of the skin, unspecified: Secondary | ICD-10-CM | POA: Diagnosis not present

## 2012-02-18 DIAGNOSIS — D239 Other benign neoplasm of skin, unspecified: Secondary | ICD-10-CM | POA: Diagnosis not present

## 2012-02-18 DIAGNOSIS — D1801 Hemangioma of skin and subcutaneous tissue: Secondary | ICD-10-CM | POA: Diagnosis not present

## 2012-02-18 DIAGNOSIS — L819 Disorder of pigmentation, unspecified: Secondary | ICD-10-CM | POA: Diagnosis not present

## 2012-03-14 DIAGNOSIS — H4011X0 Primary open-angle glaucoma, stage unspecified: Secondary | ICD-10-CM | POA: Diagnosis not present

## 2012-03-14 DIAGNOSIS — H18519 Endothelial corneal dystrophy, unspecified eye: Secondary | ICD-10-CM | POA: Diagnosis not present

## 2012-03-14 DIAGNOSIS — H409 Unspecified glaucoma: Secondary | ICD-10-CM | POA: Diagnosis not present

## 2012-05-11 ENCOUNTER — Other Ambulatory Visit: Payer: Self-pay | Admitting: Dermatology

## 2012-05-11 DIAGNOSIS — Z85828 Personal history of other malignant neoplasm of skin: Secondary | ICD-10-CM | POA: Diagnosis not present

## 2012-05-11 DIAGNOSIS — C4441 Basal cell carcinoma of skin of scalp and neck: Secondary | ICD-10-CM | POA: Diagnosis not present

## 2012-05-11 DIAGNOSIS — L819 Disorder of pigmentation, unspecified: Secondary | ICD-10-CM | POA: Diagnosis not present

## 2012-05-11 DIAGNOSIS — L821 Other seborrheic keratosis: Secondary | ICD-10-CM | POA: Diagnosis not present

## 2012-05-11 DIAGNOSIS — D235 Other benign neoplasm of skin of trunk: Secondary | ICD-10-CM | POA: Diagnosis not present

## 2012-06-14 ENCOUNTER — Other Ambulatory Visit: Payer: Self-pay | Admitting: Dermatology

## 2012-06-14 DIAGNOSIS — L821 Other seborrheic keratosis: Secondary | ICD-10-CM | POA: Diagnosis not present

## 2012-06-14 DIAGNOSIS — Z85828 Personal history of other malignant neoplasm of skin: Secondary | ICD-10-CM | POA: Diagnosis not present

## 2012-06-14 DIAGNOSIS — L819 Disorder of pigmentation, unspecified: Secondary | ICD-10-CM | POA: Diagnosis not present

## 2012-06-14 DIAGNOSIS — C4359 Malignant melanoma of other part of trunk: Secondary | ICD-10-CM | POA: Diagnosis not present

## 2012-06-14 DIAGNOSIS — L723 Sebaceous cyst: Secondary | ICD-10-CM | POA: Diagnosis not present

## 2012-06-14 DIAGNOSIS — D239 Other benign neoplasm of skin, unspecified: Secondary | ICD-10-CM | POA: Diagnosis not present

## 2012-06-14 DIAGNOSIS — D485 Neoplasm of uncertain behavior of skin: Secondary | ICD-10-CM | POA: Diagnosis not present

## 2012-06-14 DIAGNOSIS — Z85820 Personal history of malignant melanoma of skin: Secondary | ICD-10-CM | POA: Diagnosis not present

## 2012-06-14 DIAGNOSIS — D1801 Hemangioma of skin and subcutaneous tissue: Secondary | ICD-10-CM | POA: Diagnosis not present

## 2012-07-04 ENCOUNTER — Other Ambulatory Visit: Payer: Self-pay | Admitting: Dermatology

## 2012-07-04 DIAGNOSIS — D485 Neoplasm of uncertain behavior of skin: Secondary | ICD-10-CM | POA: Diagnosis not present

## 2012-07-04 DIAGNOSIS — C4359 Malignant melanoma of other part of trunk: Secondary | ICD-10-CM | POA: Diagnosis not present

## 2012-09-13 DIAGNOSIS — Z853 Personal history of malignant neoplasm of breast: Secondary | ICD-10-CM | POA: Diagnosis not present

## 2012-09-15 ENCOUNTER — Other Ambulatory Visit: Payer: Self-pay | Admitting: Dermatology

## 2012-09-15 DIAGNOSIS — L821 Other seborrheic keratosis: Secondary | ICD-10-CM | POA: Diagnosis not present

## 2012-09-15 DIAGNOSIS — D485 Neoplasm of uncertain behavior of skin: Secondary | ICD-10-CM | POA: Diagnosis not present

## 2012-09-15 DIAGNOSIS — Z85820 Personal history of malignant melanoma of skin: Secondary | ICD-10-CM | POA: Diagnosis not present

## 2012-09-15 DIAGNOSIS — L570 Actinic keratosis: Secondary | ICD-10-CM | POA: Diagnosis not present

## 2012-09-15 DIAGNOSIS — L723 Sebaceous cyst: Secondary | ICD-10-CM | POA: Diagnosis not present

## 2012-09-15 DIAGNOSIS — C44519 Basal cell carcinoma of skin of other part of trunk: Secondary | ICD-10-CM | POA: Diagnosis not present

## 2012-09-15 DIAGNOSIS — Z85828 Personal history of other malignant neoplasm of skin: Secondary | ICD-10-CM | POA: Diagnosis not present

## 2012-09-15 DIAGNOSIS — D239 Other benign neoplasm of skin, unspecified: Secondary | ICD-10-CM | POA: Diagnosis not present

## 2012-10-25 ENCOUNTER — Encounter: Payer: Medicare Other | Admitting: Family Medicine

## 2012-11-15 DIAGNOSIS — H18519 Endothelial corneal dystrophy, unspecified eye: Secondary | ICD-10-CM | POA: Diagnosis not present

## 2012-11-15 DIAGNOSIS — H409 Unspecified glaucoma: Secondary | ICD-10-CM | POA: Diagnosis not present

## 2012-11-15 DIAGNOSIS — H4011X0 Primary open-angle glaucoma, stage unspecified: Secondary | ICD-10-CM | POA: Diagnosis not present

## 2012-11-15 DIAGNOSIS — H2510 Age-related nuclear cataract, unspecified eye: Secondary | ICD-10-CM | POA: Diagnosis not present

## 2012-11-30 DIAGNOSIS — Z23 Encounter for immunization: Secondary | ICD-10-CM | POA: Diagnosis not present

## 2012-12-12 DIAGNOSIS — L988 Other specified disorders of the skin and subcutaneous tissue: Secondary | ICD-10-CM | POA: Insufficient documentation

## 2012-12-21 DIAGNOSIS — L570 Actinic keratosis: Secondary | ICD-10-CM | POA: Diagnosis not present

## 2012-12-21 DIAGNOSIS — D239 Other benign neoplasm of skin, unspecified: Secondary | ICD-10-CM | POA: Diagnosis not present

## 2012-12-21 DIAGNOSIS — Z85820 Personal history of malignant melanoma of skin: Secondary | ICD-10-CM | POA: Diagnosis not present

## 2012-12-21 DIAGNOSIS — L723 Sebaceous cyst: Secondary | ICD-10-CM | POA: Diagnosis not present

## 2012-12-21 DIAGNOSIS — Z85828 Personal history of other malignant neoplasm of skin: Secondary | ICD-10-CM | POA: Diagnosis not present

## 2012-12-21 DIAGNOSIS — L819 Disorder of pigmentation, unspecified: Secondary | ICD-10-CM | POA: Diagnosis not present

## 2012-12-28 ENCOUNTER — Encounter: Payer: Medicare Other | Admitting: Family Medicine

## 2013-03-08 ENCOUNTER — Ambulatory Visit (INDEPENDENT_AMBULATORY_CARE_PROVIDER_SITE_OTHER): Payer: Medicare Other | Admitting: Family Medicine

## 2013-03-08 ENCOUNTER — Encounter: Payer: Self-pay | Admitting: Family Medicine

## 2013-03-08 VITALS — BP 120/80 | Temp 98.5°F | Ht 63.5 in | Wt 130.0 lb

## 2013-03-08 DIAGNOSIS — L578 Other skin changes due to chronic exposure to nonionizing radiation: Secondary | ICD-10-CM | POA: Diagnosis not present

## 2013-03-08 DIAGNOSIS — H6120 Impacted cerumen, unspecified ear: Secondary | ICD-10-CM

## 2013-03-08 DIAGNOSIS — R079 Chest pain, unspecified: Secondary | ICD-10-CM

## 2013-03-08 DIAGNOSIS — Z23 Encounter for immunization: Secondary | ICD-10-CM | POA: Diagnosis not present

## 2013-03-08 DIAGNOSIS — R0789 Other chest pain: Secondary | ICD-10-CM

## 2013-03-08 DIAGNOSIS — H409 Unspecified glaucoma: Secondary | ICD-10-CM

## 2013-03-08 DIAGNOSIS — Z0000 Encounter for general adult medical examination without abnormal findings: Secondary | ICD-10-CM | POA: Diagnosis not present

## 2013-03-08 DIAGNOSIS — R071 Chest pain on breathing: Secondary | ICD-10-CM

## 2013-03-08 LAB — POCT URINALYSIS DIPSTICK
Bilirubin, UA: NEGATIVE
Blood, UA: NEGATIVE
Glucose, UA: NEGATIVE
Ketones, UA: NEGATIVE
Nitrite, UA: POSITIVE
Protein, UA: NEGATIVE
Spec Grav, UA: 1.02
Urobilinogen, UA: 0.2
pH, UA: 7

## 2013-03-08 LAB — CBC WITH DIFFERENTIAL/PLATELET
Basophils Absolute: 0 10*3/uL (ref 0.0–0.1)
Basophils Relative: 0.5 % (ref 0.0–3.0)
Eosinophils Absolute: 0.1 10*3/uL (ref 0.0–0.7)
Eosinophils Relative: 2.8 % (ref 0.0–5.0)
HCT: 44.7 % (ref 36.0–46.0)
Hemoglobin: 14.4 g/dL (ref 12.0–15.0)
Lymphocytes Relative: 30.8 % (ref 12.0–46.0)
Lymphs Abs: 1.6 10*3/uL (ref 0.7–4.0)
MCHC: 32.2 g/dL (ref 30.0–36.0)
MCV: 86.9 fl (ref 78.0–100.0)
Monocytes Absolute: 0.4 10*3/uL (ref 0.1–1.0)
Monocytes Relative: 7.8 % (ref 3.0–12.0)
Neutro Abs: 3 10*3/uL (ref 1.4–7.7)
Neutrophils Relative %: 58.1 % (ref 43.0–77.0)
Platelets: 255 10*3/uL (ref 150.0–400.0)
RBC: 5.15 Mil/uL — ABNORMAL HIGH (ref 3.87–5.11)
RDW: 13.3 % (ref 11.5–14.6)
WBC: 5.1 10*3/uL (ref 4.5–10.5)

## 2013-03-08 LAB — LDL CHOLESTEROL, DIRECT: Direct LDL: 165.1 mg/dL

## 2013-03-08 LAB — BASIC METABOLIC PANEL
BUN: 16 mg/dL (ref 6–23)
CO2: 27 mEq/L (ref 19–32)
Calcium: 9.1 mg/dL (ref 8.4–10.5)
Chloride: 106 mEq/L (ref 96–112)
Creatinine, Ser: 0.8 mg/dL (ref 0.4–1.2)
GFR: 70.93 mL/min (ref >60.00)
Glucose, Bld: 85 mg/dL (ref 70–99)
Potassium: 3.7 mEq/L (ref 3.5–5.1)
Sodium: 140 mEq/L (ref 135–145)

## 2013-03-08 LAB — LIPID PANEL
Cholesterol: 245 mg/dL — ABNORMAL HIGH (ref 0–200)
HDL: 67.8 mg/dL (ref >39.00)
Total CHOL/HDL Ratio: 4
Triglycerides: 77 mg/dL (ref 0.0–149.0)
VLDL: 15.4 mg/dL (ref 0.0–40.0)

## 2013-03-08 LAB — TSH: TSH: 0.69 u[IU]/mL (ref 0.35–5.50)

## 2013-03-08 NOTE — Progress Notes (Signed)
Pre visit review using our clinic review tool, if applicable. No additional management support is needed unless otherwise documented below in the visit note. 

## 2013-03-08 NOTE — Patient Instructions (Signed)
Continue current medications  I will set you up a time to see Dr. Loralie Champagne for cardiac evaluation. Your symptoms are most consistent with what we call chest wall pain however I want to get a cardiology consult to be sure

## 2013-03-08 NOTE — Progress Notes (Signed)
   Subjective:    Patient ID: Carmen [name], female    DOB: [birth year], 73 y.o.   MRN: [id number]  HPI Carmen [name] is a 73 year old married female nonsmoker who comes in today for a Medicare wellness examination  She's always been in excellent health he said no chronic health problems except glaucoma and skin cancer. She sees her ophthalmologist every 6 months and is on eyedrops daily.  She's had 2 melanomas and some recent squamous cell carcinomas removed by her dermatologist  She states about 3 days ago she was walking and had a burning sensation in her left upper anterior chest wall. She described it as a 5 on a scale of 1-10. It did not radiate she did not get short of breath or just what. Over that day she had 6 episodes over 2 hour period of time. Some of which were related to exertion summer which were not. She's had no history of any cardiac or pulmonary problems in the past. Her blood pressure blood sugar lipids have all been normal and she is a nonsmoker. One brother in good health mother had angina but she was a smoker.  Cognitive function normal she walks on a regular basis home health safety reviewed no issues identified, no guns in the house, she does have a health care power of attorney and living well  Vaccinations updated   Review of Systems  Constitutional: Negative.   HENT: Negative.   Eyes: Negative.   Respiratory: Negative.   Cardiovascular: Positive for chest pain.  Gastrointestinal: Negative.   Genitourinary: Negative.   Musculoskeletal: Negative.   Neurological: Negative.   Psychiatric/Behavioral: Negative.        Objective:   Physical Exam  Nursing note and vitals reviewed. Constitutional: She is oriented to person, place, and time. She appears well-developed and well-nourished.  HENT:  Head: Normocephalic and atraumatic.  Right Ear: External ear normal.  Left Ear: External ear normal.  Nose: Nose normal.  Mouth/Throat: Oropharynx is clear and moist.    Eyes: EOM are normal. Pupils are equal, round, and reactive to light.  Neck: Normal range of motion. Neck supple. No thyromegaly present.  Cardiovascular: Normal rate, regular rhythm, normal heart sounds and intact distal pulses.  Exam reveals no gallop and no friction rub.   No murmur heard. No carotid aortic bruits peripheral pulses 2+ and symmetrical  Pulmonary/Chest: Effort normal and breath sounds normal.  Abdominal: Soft. Bowel sounds are normal. She exhibits no distension and no mass. There is no tenderness. There is no rebound.  Genitourinary:  Bilateral breast exam normal bilateral implants recent mammogram normal  Musculoskeletal: Normal range of motion.  Lymphadenopathy:    She has no cervical adenopathy.  Neurological: She is alert and oriented to person, place, and time. She has normal reflexes. No cranial nerve deficit. She exhibits normal muscle tone. Coordination normal.  Skin: Skin is warm and dry.  Total body skin exam she has light skin and light eyes and to instruct her back from previous melanoma removal scars left lower extremity from recent squamous cell carcinoma removal  Psychiatric: She has a normal mood and affect. Her behavior is normal. Judgment and thought content normal.          Assessment & Plan:  Healthy female  Glaucoma continue eyedrops and followed by ophthalmology  History of melanoma in squamous cell carcinoma continue followup by dermatology  Atypical chest pain plan cardiac consult will for stress test

## 2013-03-10 ENCOUNTER — Emergency Department (HOSPITAL_COMMUNITY)
Admission: EM | Admit: 2013-03-10 | Discharge: 2013-03-11 | Disposition: A | Discharge status: Home / Self Care | Payer: Medicare Other | Attending: Emergency Medicine | Admitting: Emergency Medicine

## 2013-03-10 ENCOUNTER — Emergency Department (HOSPITAL_COMMUNITY): Payer: Medicare Other

## 2013-03-10 ENCOUNTER — Encounter (HOSPITAL_COMMUNITY): Payer: Self-pay | Admitting: Emergency Medicine

## 2013-03-10 DIAGNOSIS — Z8739 Personal history of other diseases of the musculoskeletal system and connective tissue: Secondary | ICD-10-CM | POA: Insufficient documentation

## 2013-03-10 DIAGNOSIS — R079 Chest pain, unspecified: Secondary | ICD-10-CM

## 2013-03-10 DIAGNOSIS — Z872 Personal history of diseases of the skin and subcutaneous tissue: Secondary | ICD-10-CM | POA: Diagnosis not present

## 2013-03-10 DIAGNOSIS — Z8669 Personal history of other diseases of the nervous system and sense organs: Secondary | ICD-10-CM | POA: Diagnosis not present

## 2013-03-10 DIAGNOSIS — IMO0002 Reserved for concepts with insufficient information to code with codable children: Secondary | ICD-10-CM | POA: Insufficient documentation

## 2013-03-10 DIAGNOSIS — R072 Precordial pain: Secondary | ICD-10-CM | POA: Insufficient documentation

## 2013-03-10 DIAGNOSIS — R002 Palpitations: Secondary | ICD-10-CM | POA: Diagnosis not present

## 2013-03-10 DIAGNOSIS — R011 Cardiac murmur, unspecified: Secondary | ICD-10-CM | POA: Insufficient documentation

## 2013-03-10 LAB — POCT I-STAT TROPONIN I
Troponin i, poc: 0 ng/mL (ref 0.00–0.08)
Troponin i, poc: 0.01 ng/mL (ref 0.00–0.08)

## 2013-03-10 LAB — CBC
HCT: 44.3 % (ref 36.0–46.0)
Hemoglobin: 14.9 g/dL (ref 12.0–15.0)
MCH: 29 pg (ref 26.0–34.0)
MCHC: 33.6 g/dL (ref 30.0–36.0)
MCV: 86.4 fL (ref 78.0–100.0)
Platelets: 239 10*3/uL (ref 150–400)
RBC: 5.13 MIL/uL — ABNORMAL HIGH (ref 3.87–5.11)
RDW: 13 % (ref 11.5–15.5)
WBC: 6.6 10*3/uL (ref 4.0–10.5)

## 2013-03-10 LAB — BASIC METABOLIC PANEL
BUN: 21 mg/dL (ref 6–23)
CO2: 24 mEq/L (ref 19–32)
Calcium: 9.7 mg/dL (ref 8.4–10.5)
Chloride: 103 mEq/L (ref 96–112)
Creatinine, Ser: 0.82 mg/dL (ref 0.50–1.10)
GFR calc Af Amer: 81 mL/min — ABNORMAL LOW (ref >90)
GFR calc non Af Amer: 70 mL/min — ABNORMAL LOW (ref >90)
Glucose, Bld: 87 mg/dL (ref 70–99)
Potassium: 4.1 mEq/L (ref 3.7–5.3)
Sodium: 144 mEq/L (ref 137–147)

## 2013-03-10 MED ORDER — ASPIRIN EC 325 MG PO TBEC
325.0000 mg | DELAYED_RELEASE_TABLET | Freq: Once | ORAL | Status: AC
Start: 2013-03-10 — End: 2013-03-10
  Administered 2013-03-10: 325 mg via ORAL
  Filled 2013-03-10: qty 1

## 2013-03-10 NOTE — ED Notes (Signed)
Patient states that today she felt discomfort in the back of her neck, checked her HR and felt that it was irregular, and also felt lightheaded

## 2013-03-10 NOTE — ED Notes (Addendum)
Pt reports she began to feel lightheaded this afternoon and she felt her PR was irregular. Then she began to have a tightness and discomfort in the back of her neck. Reports the discomfort has persisted since onset. She reports similar episodes 2 and 4 days ago. A&Ox4, breathing easily

## 2013-03-10 NOTE — ED Provider Notes (Signed)
I saw and evaluated the patient, reviewed the resident's note and I agree with the findings and plan.  EKG Interpretation    Date/Time:  Saturday March 10 2013 18:06:45 EST Ventricular Rate:  94 PR Interval:  156 QRS Duration: 84 QT Interval:  372 QTC Calculation: 465 R Axis:   56 Text Interpretation:  Normal sinus rhythm Septal infarct , age undetermined Abnormal ECG No old for comparison Confirmed by Deshone Lyssy  DO, Mykle Pascua ([id number]) on 03/10/2013 7:55:59 PM            Pt is a 73 y.o. female with no significant past medical history who presents emergency department with an episode of palpitations, jaw discomfort and lightheadedness that started at 5:30 PM while at rest and lasted for approximately 1 hour. She states that 4 days ago she did have an episode of chest pain that spontaneously resolved after several minutes. She denies any chest pain tonight. No shortness of breath, nausea or vomiting. She states she did feel like she was going to pass out. 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 She does not have a history of arrhythmia. She is on anticoagulation. Currently she is asymptomatic. On exam, patient is hemodynamically stable, in normal sinus rhythm, normal heart and lung sounds, abdomen soft nontender. Patient has no risk factors for ACS or pulmonary embolus other than age. She denies a history of heavy alcohol use. Patient's initial labs are unremarkable including troponin. We'll repeat a second troponin 6 hours after the onset of symptoms. Have offered admission but patient reports she would like to be discharged home. She states she was seen by her primary care physician Dr. Sherren Mocha at 5 and annual checkup several days ago and is scheduled to have a stress test as an outpatient.   Repeat troponin is negative. She has been hemodynamically stable without arrhythmia on the monitor. She would prefer discharge home. She has an outpatient stress test scheduled next  week. We'll have her discuss with her PCP as well as she may need an outpatient echocardiogram. Have given strict return precautions.  [place], DO 03/11/13 0207

## 2013-03-10 NOTE — ED Provider Notes (Signed)
CSN: [id number]     Arrival date & time 03/10/13  1803 History   First MD Initiated Contact with Patient 03/10/13 1823     Chief Complaint  Patient presents with  . Chest Pain   (Consider location/radiation/quality/duration/timing/severity/associated sxs/prior Treatment) Patient is a 73 y.o. female presenting with chest pain. The history is provided by the patient and the spouse.  Chest Pain Pain location:  Substernal area Pain quality: aching and dull   Radiates to: left and right neck. Pain radiates to the back: no   Pain severity:  Mild Onset quality:  Gradual Duration:  4 days Timing:  Intermittent Progression:  Waxing and waning Chronicity:  Recurrent Context: not breathing, no intercourse and not raising an arm   Relieved by:  Nothing Worsened by:  Nothing tried Ineffective treatments:  None tried Associated symptoms: palpitations   Associated symptoms: no abdominal pain, no diaphoresis, no dizziness, no fatigue, no fever, no headache, no lower extremity edema, no nausea, no shortness of breath and not vomiting   Risk factors: no birth control, no coronary artery disease, no diabetes mellitus, no high cholesterol, no hypertension, not obese and no prior DVT/PE     73 year old female with the chief complaint of chest discomfort. Patient said she felt was the first time about 4 days ago. Feels like it's at the center of her chest and radiates up to the bilateral sides of her neck. Pain mostly stays up in her neck. Patient denies any shortness of breath patient denies any cough fever or chills. Patient denies any abdominal pain patient denies any association with food.  Patient denies any trauma. Patient denies any worsening or relieving factors. Patient was seen by her PCP 2 days ago for similar pain. 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 Patient having some PVCs. Patient denies any history of hypertension, diabetes, smoking, hyperlipidemia, family  history. Patient exercises 3 times a week. Patient has not noted any chest discomfort while exercising doing cardiovascular workout.  Patient's PCP recommended doing an outpatient stress test.  Past Medical History  Diagnosis Date  . GLAUCOMA NOS 09/23/2009  . CERUMEN IMPACTION 05/27/2008  . DERMATITIS DUE TO SOLAR RADIATION 05/03/2007  . Inflamed seborrheic keratosis 12/26/2009  . Dyshidrosis 05/03/2007  . FOOT PAIN, LEFT 08/16/2007  . OSTEOPENIA 05/03/2007  . Tinnitus   . Heart murmur   . Bleb     X 4   Past Surgical History  Procedure Laterality Date  . Breast surgery      mastectomy  . Breast surgery      implants   Family History  Problem Relation Age of Onset  . Cancer Other     breast  . Stroke Other     stroke  . Heart disease Other    History  Substance Use Topics  . Smoking status: Never Smoker   . Smokeless tobacco: Not on file  . Alcohol Use:    OB History   Grav Para Term Preterm Abortions TAB SAB Ect Mult Living                 Review of Systems  Constitutional: Negative for fever, chills, diaphoresis and fatigue.  HENT: Negative for congestion and rhinorrhea.   Eyes: Negative for redness and visual disturbance.  Respiratory: Negative for shortness of breath and wheezing.   Cardiovascular: Positive for chest pain and palpitations.  Gastrointestinal: Negative for nausea, vomiting and abdominal pain.  Genitourinary: Negative for dysuria  and urgency.  Musculoskeletal: Negative for arthralgias and myalgias.  Skin: Negative for pallor and wound.  Neurological: Negative for dizziness and headaches.    Allergies  Sulfonamide derivatives  Home Medications   Current Outpatient Rx  Name  Route  Sig  Dispense  Refill  . LUMIGAN 0.01 % SOLN               . Multiple Vitamins-Minerals (MULTIVITAL PO)   Oral   Take by mouth.           . triamcinolone cream (KENALOG) 0.1 %   Topical   Apply 1 application topically 2 (two) times daily.   30 g   3     BP 126/79  Pulse 81  Temp(Src) 98.2 F (36.8 C) (Oral)  Resp 20  Ht 5\' 3"  (1.6 m)  Wt 128 lb 14.4 oz (58.469 kg)  BMI 22.84 kg/m2  SpO2 97% Physical Exam  Nursing note and vitals reviewed. Constitutional: She is oriented to person, place, and time. She appears well-developed and well-nourished. No distress.  HENT:  Head: Normocephalic and atraumatic.  Her to palpation about PE attachment of the sternocleidomastoid bilaterally to the occiput  Eyes: EOM are normal. Pupils are equal, round, and reactive to light.  Neck: Normal range of motion. Neck supple.  Cardiovascular: Normal rate and regular rhythm.  Exam reveals no gallop and no friction rub.   No murmur heard. Pulmonary/Chest: Effort normal. She has no wheezes. She has no rales.  Abdominal: Soft. She exhibits no distension. There is no tenderness. There is no rebound and no guarding.  Musculoskeletal: She exhibits no edema and no tenderness.  Neurological: She is alert and oriented to person, place, and time.  Skin: Skin is warm and dry. She is not diaphoretic.  Psychiatric: She has a normal mood and affect. Her behavior is normal.    ED Course  Procedures (including critical care time) Labs Review Labs Reviewed  CBC - Abnormal; Notable for the following:    RBC 5.13 (*)    All other components within normal limits  BASIC METABOLIC PANEL - Abnormal; Notable for the following:    GFR calc non Af Amer 70 (*)    GFR calc Af Amer 81 (*)    All other components within normal limits  POCT I-STAT TROPONIN I  POCT I-STAT TROPONIN I   Imaging Review Dg Chest 2 View  03/10/2013   CLINICAL DATA:  Posterior neck pain, chest pain  EXAM: CHEST  2 VIEW  COMPARISON:  DG RIBS UNILATERAL W/CHEST*R* dated 04/15/2006; DG CHEST 2 VIEW dated 01/05/2006  FINDINGS: The heart size and mediastinal contours are within normal limits. Both lungs are clear. The visualized skeletal structures are unremarkable.  IMPRESSION: No active cardiopulmonary  disease.   Electronically Signed   By: Kathreen Devoid   On: 03/10/2013 19:41    EKG Interpretation    Date/Time:  Saturday March 10 2013 18:06:45 EST Ventricular Rate:  94 PR Interval:  156 QRS Duration: 84 QT Interval:  372 QTC Calculation: 465 R Axis:   56 Text Interpretation:  Normal sinus rhythm Septal infarct , age undetermined Abnormal ECG No old for comparison Confirmed by WARD  DO, KRISTEN ([id number]) on 03/10/2013 7:55:59 PM            MDM   1. Chest pain, unspecified      73 year old female very low risk for ACS. Doubt PE by history and physical exam, we'll do serial troponins here.  First  troponin negative patient with no chest pain.   Repeat 3 hour troponin also negative patient still endorsing the chest pain appears well nontoxic. We'll discharge the patient home she will follow up with her PCP. Visual return for sudden worsening chest pain.  11:58 PM:  I have discussed the diagnosis/risks/treatment options with the patient and family and believe the pt to be eligible for discharge home to follow-up with PCP. We also discussed returning to the ED immediately if new or worsening sx occur. We discussed the sx which are most concerning (e.g., worsening chest pain, syncope) that necessitate immediate return. Medications administered to the patient during their visit and any new prescriptions provided to the patient are listed below.  Medications given during this visit Medications  aspirin EC tablet 325 mg (325 mg Oral Given 03/10/13 1946)    New Prescriptions   No medications on file     Deno Etienne, MD 03/10/13 2358

## 2013-03-10 NOTE — ED Notes (Signed)
Dr. Floyd at bedside. 

## 2013-03-10 NOTE — Discharge Instructions (Signed)
Follow up with your PCP, return for sudden worsening pain.   Chest Pain (Nonspecific) It is often hard to give a specific diagnosis for the cause of chest pain. There is always a chance that your pain could be related to something serious, such as a heart attack or a blood clot in the lungs. You need to follow up with your caregiver for further evaluation. CAUSES   Heartburn.  Pneumonia or bronchitis.  Anxiety or stress.  Inflammation around your heart (pericarditis) or lung (pleuritis or pleurisy).  A blood clot in the lung.  A collapsed lung (pneumothorax). It can develop suddenly on its own (spontaneous pneumothorax) or from injury (trauma) to the chest.  Shingles infection (herpes zoster virus). The chest wall is composed of bones, muscles, and cartilage. Any of these can be the source of the pain.  The bones can be bruised by injury.  The muscles or cartilage can be strained by coughing or overwork.  The cartilage can be affected by inflammation and become sore (costochondritis). DIAGNOSIS  Lab tests or other studies, such as X-rays, electrocardiography, stress testing, or cardiac imaging, may be needed to find the cause of your pain.  TREATMENT   Treatment depends on what may be causing your chest pain. Treatment may include:  Acid blockers for heartburn.  Anti-inflammatory medicine.  Pain medicine for inflammatory conditions.  Antibiotics if an infection is present.  You may be advised to change lifestyle habits. This includes stopping smoking and avoiding alcohol, caffeine, and chocolate.  You may be advised to keep your head raised (elevated) when sleeping. This reduces the chance of acid going backward from your stomach into your esophagus.  Most of the time, nonspecific chest pain will improve within 2 to 3 days with rest and mild pain medicine. HOME CARE INSTRUCTIONS   If antibiotics were prescribed, take your antibiotics as directed. Finish them even if you  start to feel better.  For the next few days, avoid physical activities that bring on chest pain. Continue physical activities as directed.  Do not smoke.  Avoid drinking alcohol.  Only take over-the-counter or prescription medicine for pain, discomfort, or fever as directed by your caregiver.  Follow your caregiver's suggestions for further testing if your chest pain does not go away.  Keep any follow-up appointments you made. If you do not go to an appointment, you could develop lasting (chronic) problems with pain. If there is any problem keeping an appointment, you must call to reschedule. SEEK MEDICAL CARE IF:   You think you are having problems from the medicine you are taking. Read your medicine instructions carefully.  Your chest pain does not go away, even after treatment.  You develop a rash with blisters on your chest. SEEK IMMEDIATE MEDICAL CARE IF:   You have increased chest pain or pain that spreads to your arm, neck, jaw, back, or abdomen.  You develop shortness of breath, an increasing cough, or you are coughing up blood.  You have severe back or abdominal pain, feel nauseous, or vomit.  You develop severe weakness, fainting, or chills.  You have a fever. THIS IS AN EMERGENCY. Do not wait to see if the pain will go away. Get medical help at once. Call your local emergency services (911 in [place] Do not drive yourself to the hospital. MAKE SURE YOU:   Understand these instructions.  Will watch your condition.  Will get help right away if you are not doing well or get worse. Document Released:  11/04/2004 Document Revised: 04/19/2011 Document Reviewed: 08/31/2007 [facility] Patient Information 2014 [place].

## 2013-03-11 NOTE — ED Notes (Signed)
Pt ambulatory at discharge, A&Ox4, verbalizing no complaints at this time and thanking staff for her care.

## 2013-03-13 ENCOUNTER — Telehealth: Payer: Self-pay | Admitting: *Deleted

## 2013-03-13 NOTE — Telephone Encounter (Signed)
Patient was seen in the ED over the weekend, and wants to know if seeing a cardiologist is still the best avenue to take. She states that they completed cardiac "test" and they where fine. Please advise.

## 2013-03-14 NOTE — Telephone Encounter (Signed)
Patient is aware that she should go to Cardiology as instructed

## 2013-03-20 ENCOUNTER — Ambulatory Visit (INDEPENDENT_AMBULATORY_CARE_PROVIDER_SITE_OTHER): Payer: Medicare Other | Admitting: Cardiology

## 2013-03-20 ENCOUNTER — Encounter: Payer: Self-pay | Admitting: *Deleted

## 2013-03-20 ENCOUNTER — Encounter: Payer: Self-pay | Admitting: Cardiology

## 2013-03-20 VITALS — BP 116/64 | HR 87 | Ht 63.0 in | Wt 128.0 lb

## 2013-03-20 DIAGNOSIS — E785 Hyperlipidemia, unspecified: Secondary | ICD-10-CM | POA: Diagnosis not present

## 2013-03-20 DIAGNOSIS — R079 Chest pain, unspecified: Secondary | ICD-10-CM | POA: Diagnosis not present

## 2013-03-20 NOTE — Patient Instructions (Signed)
Your physician has requested that you have en exercise stress myoview. For further information please visit [email or web address]. Please follow instruction sheet, as given.  Your physician recommends that you schedule a follow-up appointment as needed with Dr McLean.      

## 2013-03-21 DIAGNOSIS — E785 Hyperlipidemia, unspecified: Secondary | ICD-10-CM | POA: Insufficient documentation

## 2013-03-21 DIAGNOSIS — E782 Mixed hyperlipidemia: Secondary | ICD-10-CM | POA: Insufficient documentation

## 2013-03-21 NOTE — Progress Notes (Signed)
Patient ID: Carmen [name], female   DOB: Aug 19, 1940, 73 y.o.   MRN: [id number] PCP: Dr. Sherren Mocha  73 yo with histoy rof hyperlipidemia presents for evaluation of chest pain.  Back in 1/15, patient had 5-6 episodes of a burning in her chest while walking outside.  Episodes would last a few seconds then resolve.  She saw her PCP soon after this for a physical, and it was recommended that she have a stress test.  2 days later, she was getting dressed to go to a party and developed mild lightheadedness + burning in her chest.  She went to the ER. She was seen there and was sent home after troponin was negative x 2 and ECG was unremarkable.  Since then, she has had no further symptoms.  She had had no chest pain before these 2 episodes either.  She works out 2-3 times a week and denies chest pain or dyspnea.  No tachypalpitations.  No prior cardiac history.  She is not a smoker.  No diabetes or HTN.    ECG: NSR, Qs in V1 and V2.   Labs (1/15): K 4.1, creatinine 0.82, LDL 165, HDL 68  PMH: 1. Hyperlipidemia 2. Bilateral mastectomies for family history of breast cancer.   SH: Married, nonsmoker, lives in [place]  FH: Father with TIAs.  No history of CAD.   ROS: All systems reviewed and negative except as per HPI.   Current Outpatient Prescriptions  Medication Sig Dispense Refill  . LUMIGAN 0.01 % SOLN       . Multiple Vitamins-Minerals (MULTIVITAL PO) Take by mouth.        . triamcinolone cream (KENALOG) 0.1 % Apply 1 application topically 2 (two) times daily.  30 g  3   No current facility-administered medications for this visit.    BP 116/64  Pulse 87  Ht 5\' 3"  (1.6 m)  Wt 58.06 kg (128 lb)  BMI 22.68 kg/m2 General: NAD Neck: No JVD, no thyromegaly or thyroid nodule.  Lungs: Clear to auscultation bilaterally with normal respiratory effort. CV: Nondisplaced PMI.  Heart regular S1/S2, no S3/S4, no murmur.  No peripheral edema.  No carotid bruit.  Normal pedal pulses.  Abdomen: Soft,  nontender, no hepatosplenomegaly, no distention.  Skin: Intact without lesions or rashes.  Neurologic: Alert and oriented x 3.  Psych: Normal affect. Extremities: No clubbing or cyanosis.  HEENT: Normal.   Assessment/Plan: 1. Chest pain: Atypical chest pain.  Only risk factors are age and hyperlipidemia.  I will arrange for ETT-Cardiolite to risk stratify.  2. Hyperlipidemia: LDL is high.  If she has any evidence for CAD on stress test, she should start statin.   Loralie Champagne 03/21/2013

## 2013-03-22 ENCOUNTER — Ambulatory Visit (INDEPENDENT_AMBULATORY_CARE_PROVIDER_SITE_OTHER): Payer: Medicare Other | Admitting: Family Medicine

## 2013-03-22 ENCOUNTER — Encounter: Payer: Self-pay | Admitting: Family Medicine

## 2013-03-22 VITALS — BP 120/80

## 2013-03-22 DIAGNOSIS — H6120 Impacted cerumen, unspecified ear: Secondary | ICD-10-CM

## 2013-03-22 NOTE — Progress Notes (Signed)
Pre visit review using our clinic review tool, if applicable. No additional management support is needed unless otherwise documented below in the visit note. 

## 2013-03-22 NOTE — Progress Notes (Signed)
   Subjective:    Patient ID: Carmen [name], female    DOB: [birth year], 73 y.o.   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 I think you were leave her fear  Review of Systems    negative Objective:   Physical Exam  Bilateral cerumen impactions relieve by suction and irrigation      Assessment & Plan:  Ear wax removed no charge

## 2013-03-22 NOTE — Patient Instructions (Signed)
Return when necessary 

## 2013-03-23 ENCOUNTER — Other Ambulatory Visit: Payer: Self-pay | Admitting: Dermatology

## 2013-03-23 DIAGNOSIS — L259 Unspecified contact dermatitis, unspecified cause: Secondary | ICD-10-CM | POA: Diagnosis not present

## 2013-03-23 DIAGNOSIS — Z85820 Personal history of malignant melanoma of skin: Secondary | ICD-10-CM | POA: Diagnosis not present

## 2013-03-23 DIAGNOSIS — L819 Disorder of pigmentation, unspecified: Secondary | ICD-10-CM | POA: Diagnosis not present

## 2013-03-23 DIAGNOSIS — D485 Neoplasm of uncertain behavior of skin: Secondary | ICD-10-CM | POA: Diagnosis not present

## 2013-03-23 DIAGNOSIS — D239 Other benign neoplasm of skin, unspecified: Secondary | ICD-10-CM | POA: Diagnosis not present

## 2013-03-23 DIAGNOSIS — L821 Other seborrheic keratosis: Secondary | ICD-10-CM | POA: Diagnosis not present

## 2013-03-23 DIAGNOSIS — Z85828 Personal history of other malignant neoplasm of skin: Secondary | ICD-10-CM | POA: Diagnosis not present

## 2013-03-23 DIAGNOSIS — L723 Sebaceous cyst: Secondary | ICD-10-CM | POA: Diagnosis not present

## 2013-04-04 ENCOUNTER — Encounter (HOSPITAL_COMMUNITY): Payer: Medicare Other

## 2013-04-06 ENCOUNTER — Encounter: Payer: Self-pay | Admitting: Cardiovascular Disease

## 2013-04-06 ENCOUNTER — Ambulatory Visit (HOSPITAL_COMMUNITY): Payer: Medicare Other | Attending: Cardiovascular Disease | Admitting: Radiology

## 2013-04-06 VITALS — BP 141/75 | Ht 63.0 in | Wt 126.0 lb

## 2013-04-06 DIAGNOSIS — R079 Chest pain, unspecified: Secondary | ICD-10-CM | POA: Diagnosis not present

## 2013-04-06 DIAGNOSIS — E785 Hyperlipidemia, unspecified: Secondary | ICD-10-CM | POA: Insufficient documentation

## 2013-04-06 DIAGNOSIS — R002 Palpitations: Secondary | ICD-10-CM | POA: Insufficient documentation

## 2013-04-06 MED ORDER — TECHNETIUM TC 99M SESTAMIBI GENERIC - CARDIOLITE
30.0000 | Freq: Once | INTRAVENOUS | Status: AC | PRN
  Administered 2013-04-06: 30 via INTRAVENOUS

## 2013-04-06 MED ORDER — TECHNETIUM TC 99M SESTAMIBI GENERIC - CARDIOLITE
10.0000 | Freq: Once | INTRAVENOUS | Status: AC | PRN
  Administered 2013-04-06: 10 via INTRAVENOUS

## 2013-04-06 NOTE — Progress Notes (Addendum)
[facility] [address] [place], [place] [address] [phone]    Cardiology Nuclear Med Study  Carmen [name] is a 73 y.o. female     MRN : [id number]     DOB: Jan 14, 1941  Procedure Date: 04/06/2013  Nuclear Med Background Indication for Stress Test:  Evaluation for Ischemia; Post hospital 2/15-chest pain with neg. Enzymes/EKG History:  no prior cardiac history or testing Cardiac Risk Factors: Lipids  Symptoms:  Chest Pain and Palpitations   Nuclear Pre-Procedure Caffeine/Decaff Intake:  None NPO After: 7:00pm   Lungs:  clear O2 Sat: 95% on room air. IV 0.9% NS with Angio Cath:  22g  IV Site: R Hand  IV Started by:  Annye Rusk, CNMT  Chest Size (in):  36 Cup Size: C  Height: 5\' 3"  (1.6 m)  Weight:  126 lb (57.153 kg)  BMI:  Body mass index is 22.33 kg/(m^2). Tech Comments:  n/a    Nuclear Med Study 1 or 2 day study: 1 day  Stress Test Type:  Stress  Reading MD: Kirk Ruths, MD  Order Authorizing Provider:  Einar Crow, MD  Resting Radionuclide: Technetium 37m Sestamibi  Resting Radionuclide Dose: 11.0 mCi   Stress Radionuclide:  Technetium 84m Sestamibi  Stress Radionuclide Dose: 33.0 mCi           Stress Protocol Rest HR: 82 Stress HR: 139  Rest BP: 141/75 Stress BP: 177/110  Exercise Time (min): 7:17 METS: 9.1   Predicted Max HR: 149 bpm % Max HR: 93.29 bpm Rate Pressure Product: [phone]   Dose of Adenosine (mg):  n/a Dose of Lexiscan: n/a mg  Dose of Atropine (mg): n/a Dose of Dobutamine: n/a mcg/kg/min (at max HR)  Stress Test Technologist: Crissie Figures, RN  Nuclear Technologist:  Vedia Pereyra, CNMT     Rest Procedure:  Myocardial perfusion imaging was performed at rest 45 minutes following the intravenous administration of Technetium 58m Sestamibi. Rest ECG: NSR with non-specific ST-T wave changes; CRO prior septal MI.  Stress Procedure:  The patient exercised on the treadmill utilizing the Bruce Protocol for 7:17  minutes. The patient stopped due to fatigue and denied any chest pain.  Technetium 68m Sestamibi was injected at peak exercise and myocardial perfusion imaging was performed after a brief delay. Stress ECG: No significant ST segment change suggestive of ischemia.  QPS Raw Data Images:  There is interference from nuclear activity from structures below the diaphragm. This does not affect the ability to read the study. Stress Images:  Normal homogeneous uptake in all areas of the myocardium. Rest Images:  Normal homogeneous uptake in all areas of the myocardium. Subtraction (SDS):  No evidence of ischemia. Transient Ischemic Dilatation (Normal <1.22):  0.87 Lung/Heart Ratio (Normal <0.45):  0.42  Quantitative Gated Spect Images QGS EDV:  45 ml QGS ESV:  7 ml  Impression Exercise Capacity:  Fair exercise capacity. BP Response:  Normal blood pressure response. Clinical Symptoms:  No chest pain or dyspnea ECG Impression:  No significant ST segment change suggestive of ischemia. Comparison with Prior Nuclear Study: No images to compare  Overall Impression:  Normal stress nuclear study.  LV Ejection Fraction: 85%.  LV Wall Motion:  NL LV Function; NL Wall Motion   Kirk Ruths  Normal study, please tell patient.   Loralie Champagne 04/08/2013

## 2013-04-09 ENCOUNTER — Telehealth: Payer: Self-pay | Admitting: Cardiology

## 2013-04-09 NOTE — Progress Notes (Signed)
Pt.notified

## 2013-04-09 NOTE — Progress Notes (Signed)
LMTCB

## 2013-04-09 NOTE — Telephone Encounter (Signed)
Follow up     Pt returned you call wither test results.   Please give her a call back .

## 2013-04-09 NOTE — Telephone Encounter (Signed)
Spoke with patient about recent myoview results. 

## 2013-04-25 DIAGNOSIS — H409 Unspecified glaucoma: Secondary | ICD-10-CM | POA: Diagnosis not present

## 2013-04-25 DIAGNOSIS — H18519 Endothelial corneal dystrophy, unspecified eye: Secondary | ICD-10-CM | POA: Diagnosis not present

## 2013-04-25 DIAGNOSIS — H4011X0 Primary open-angle glaucoma, stage unspecified: Secondary | ICD-10-CM | POA: Diagnosis not present

## 2013-05-22 DIAGNOSIS — H52209 Unspecified astigmatism, unspecified eye: Secondary | ICD-10-CM | POA: Diagnosis not present

## 2013-05-22 DIAGNOSIS — H18519 Endothelial corneal dystrophy, unspecified eye: Secondary | ICD-10-CM | POA: Diagnosis not present

## 2013-05-22 DIAGNOSIS — H2510 Age-related nuclear cataract, unspecified eye: Secondary | ICD-10-CM | POA: Diagnosis not present

## 2013-06-29 DIAGNOSIS — Z85828 Personal history of other malignant neoplasm of skin: Secondary | ICD-10-CM | POA: Diagnosis not present

## 2013-06-29 DIAGNOSIS — D1801 Hemangioma of skin and subcutaneous tissue: Secondary | ICD-10-CM | POA: Diagnosis not present

## 2013-06-29 DIAGNOSIS — D239 Other benign neoplasm of skin, unspecified: Secondary | ICD-10-CM | POA: Diagnosis not present

## 2013-06-29 DIAGNOSIS — Z85820 Personal history of malignant melanoma of skin: Secondary | ICD-10-CM | POA: Diagnosis not present

## 2013-06-29 DIAGNOSIS — L819 Disorder of pigmentation, unspecified: Secondary | ICD-10-CM | POA: Diagnosis not present

## 2013-06-29 DIAGNOSIS — L821 Other seborrheic keratosis: Secondary | ICD-10-CM | POA: Diagnosis not present

## 2013-07-09 ENCOUNTER — Other Ambulatory Visit: Payer: Self-pay | Admitting: Dermatology

## 2013-07-09 DIAGNOSIS — Z85828 Personal history of other malignant neoplasm of skin: Secondary | ICD-10-CM | POA: Diagnosis not present

## 2013-07-09 DIAGNOSIS — D485 Neoplasm of uncertain behavior of skin: Secondary | ICD-10-CM | POA: Diagnosis not present

## 2013-07-09 DIAGNOSIS — L570 Actinic keratosis: Secondary | ICD-10-CM | POA: Diagnosis not present

## 2013-08-29 DIAGNOSIS — Z85828 Personal history of other malignant neoplasm of skin: Secondary | ICD-10-CM | POA: Diagnosis not present

## 2013-08-29 DIAGNOSIS — L821 Other seborrheic keratosis: Secondary | ICD-10-CM | POA: Diagnosis not present

## 2013-09-14 DIAGNOSIS — Z1231 Encounter for screening mammogram for malignant neoplasm of breast: Secondary | ICD-10-CM | POA: Diagnosis not present

## 2013-09-14 DIAGNOSIS — Z803 Family history of malignant neoplasm of breast: Secondary | ICD-10-CM | POA: Diagnosis not present

## 2013-10-02 DIAGNOSIS — L821 Other seborrheic keratosis: Secondary | ICD-10-CM | POA: Diagnosis not present

## 2013-10-02 DIAGNOSIS — Z85820 Personal history of malignant melanoma of skin: Secondary | ICD-10-CM | POA: Diagnosis not present

## 2013-10-02 DIAGNOSIS — L570 Actinic keratosis: Secondary | ICD-10-CM | POA: Diagnosis not present

## 2013-10-02 DIAGNOSIS — L723 Sebaceous cyst: Secondary | ICD-10-CM | POA: Diagnosis not present

## 2013-10-02 DIAGNOSIS — L819 Disorder of pigmentation, unspecified: Secondary | ICD-10-CM | POA: Diagnosis not present

## 2013-10-02 DIAGNOSIS — Z85828 Personal history of other malignant neoplasm of skin: Secondary | ICD-10-CM | POA: Diagnosis not present

## 2013-10-02 DIAGNOSIS — D1801 Hemangioma of skin and subcutaneous tissue: Secondary | ICD-10-CM | POA: Diagnosis not present

## 2013-10-02 DIAGNOSIS — D239 Other benign neoplasm of skin, unspecified: Secondary | ICD-10-CM | POA: Diagnosis not present

## 2013-10-23 DIAGNOSIS — H409 Unspecified glaucoma: Secondary | ICD-10-CM | POA: Insufficient documentation

## 2013-10-23 DIAGNOSIS — H18519 Endothelial corneal dystrophy, unspecified eye: Secondary | ICD-10-CM | POA: Insufficient documentation

## 2013-10-23 DIAGNOSIS — H1851 Endothelial corneal dystrophy: Secondary | ICD-10-CM

## 2013-10-23 DIAGNOSIS — H269 Unspecified cataract: Secondary | ICD-10-CM | POA: Insufficient documentation

## 2013-10-23 DIAGNOSIS — Z803 Family history of malignant neoplasm of breast: Secondary | ICD-10-CM | POA: Insufficient documentation

## 2013-10-23 DIAGNOSIS — H2510 Age-related nuclear cataract, unspecified eye: Secondary | ICD-10-CM | POA: Diagnosis not present

## 2013-10-30 DIAGNOSIS — H18519 Endothelial corneal dystrophy, unspecified eye: Secondary | ICD-10-CM | POA: Diagnosis not present

## 2013-10-30 DIAGNOSIS — H2510 Age-related nuclear cataract, unspecified eye: Secondary | ICD-10-CM | POA: Diagnosis not present

## 2013-10-30 DIAGNOSIS — H4011X0 Primary open-angle glaucoma, stage unspecified: Secondary | ICD-10-CM | POA: Diagnosis not present

## 2013-10-30 DIAGNOSIS — H409 Unspecified glaucoma: Secondary | ICD-10-CM | POA: Diagnosis not present

## 2013-11-01 DIAGNOSIS — Z79899 Other long term (current) drug therapy: Secondary | ICD-10-CM | POA: Diagnosis not present

## 2013-11-01 DIAGNOSIS — H18519 Endothelial corneal dystrophy, unspecified eye: Secondary | ICD-10-CM | POA: Diagnosis not present

## 2013-11-01 DIAGNOSIS — H2510 Age-related nuclear cataract, unspecified eye: Secondary | ICD-10-CM | POA: Diagnosis not present

## 2013-11-22 DIAGNOSIS — H1851 Endothelial corneal dystrophy: Secondary | ICD-10-CM | POA: Diagnosis not present

## 2013-11-22 DIAGNOSIS — H2512 Age-related nuclear cataract, left eye: Secondary | ICD-10-CM | POA: Diagnosis not present

## 2013-11-29 ENCOUNTER — Encounter: Payer: Self-pay | Admitting: Family Medicine

## 2013-11-29 ENCOUNTER — Ambulatory Visit (INDEPENDENT_AMBULATORY_CARE_PROVIDER_SITE_OTHER): Payer: Medicare Other | Admitting: Family Medicine

## 2013-11-29 VITALS — BP 130/90 | Temp 97.9°F | Wt 126.0 lb

## 2013-11-29 DIAGNOSIS — L729 Follicular cyst of the skin and subcutaneous tissue, unspecified: Secondary | ICD-10-CM | POA: Diagnosis not present

## 2013-11-29 NOTE — Progress Notes (Signed)
   Subjective:    Patient ID: Carmen [name], female    DOB: 1940/03/13, 73 y.o.   MRN: [id number]  HPI Carmen [name] is a 73 year old married female nonsmoker who comes in today for evaluation of a lump on her posterior left scalp  She notices 4 days ago and has gotten smaller since that time. She underwent a bilateral corneal transplant at [facility] and is on a number of eyedrops. Also because of her light skin and light eyes and sun exposure she's had some skin cancers in the past and is concerned whenever she finds a lump which is totally appropriate   Review of Systems Review of systems negative    Objective:   Physical Exam  Well-developed well-nourished female no acute distress examination scalp shows a BB sized lesion with no exit to the skin. It appears to be a hair follicle that's gotten inflamed      Assessment & Plan:  Inflamed hair follicle decreasing in size spontaneously........ Observe..........Marland Kitchen return when necessary.

## 2013-11-29 NOTE — Patient Instructions (Signed)
Return when necessary 

## 2014-01-08 DIAGNOSIS — L821 Other seborrheic keratosis: Secondary | ICD-10-CM | POA: Diagnosis not present

## 2014-01-08 DIAGNOSIS — L308 Other specified dermatitis: Secondary | ICD-10-CM | POA: Diagnosis not present

## 2014-01-08 DIAGNOSIS — D2261 Melanocytic nevi of right upper limb, including shoulder: Secondary | ICD-10-CM | POA: Diagnosis not present

## 2014-01-08 DIAGNOSIS — L814 Other melanin hyperpigmentation: Secondary | ICD-10-CM | POA: Diagnosis not present

## 2014-01-08 DIAGNOSIS — Z85828 Personal history of other malignant neoplasm of skin: Secondary | ICD-10-CM | POA: Diagnosis not present

## 2014-02-12 ENCOUNTER — Ambulatory Visit (INDEPENDENT_AMBULATORY_CARE_PROVIDER_SITE_OTHER): Payer: Medicare Other | Admitting: Family Medicine

## 2014-02-12 ENCOUNTER — Other Ambulatory Visit (HOSPITAL_COMMUNITY)
Admission: RE | Admit: 2014-02-12 | Discharge: 2014-02-12 | Disposition: A | Discharge status: Home / Self Care | Payer: Medicare Other | Source: Ambulatory Visit | Attending: Family Medicine | Admitting: Family Medicine

## 2014-02-12 ENCOUNTER — Encounter: Payer: Self-pay | Admitting: Family Medicine

## 2014-02-12 DIAGNOSIS — Z1151 Encounter for screening for human papillomavirus (HPV): Secondary | ICD-10-CM | POA: Diagnosis not present

## 2014-02-12 DIAGNOSIS — Z0000 Encounter for general adult medical examination without abnormal findings: Secondary | ICD-10-CM

## 2014-02-12 DIAGNOSIS — Z01419 Encounter for gynecological examination (general) (routine) without abnormal findings: Secondary | ICD-10-CM | POA: Diagnosis not present

## 2014-02-12 DIAGNOSIS — Z79899 Other long term (current) drug therapy: Secondary | ICD-10-CM | POA: Diagnosis not present

## 2014-02-12 DIAGNOSIS — M949 Disorder of cartilage, unspecified: Secondary | ICD-10-CM

## 2014-02-12 DIAGNOSIS — Z124 Encounter for screening for malignant neoplasm of cervix: Secondary | ICD-10-CM | POA: Insufficient documentation

## 2014-02-12 DIAGNOSIS — M899 Disorder of bone, unspecified: Secondary | ICD-10-CM

## 2014-02-12 DIAGNOSIS — Z23 Encounter for immunization: Secondary | ICD-10-CM

## 2014-02-12 DIAGNOSIS — Z7952 Long term (current) use of systemic steroids: Secondary | ICD-10-CM

## 2014-02-12 DIAGNOSIS — E785 Hyperlipidemia, unspecified: Secondary | ICD-10-CM | POA: Diagnosis not present

## 2014-02-12 DIAGNOSIS — Z85820 Personal history of malignant melanoma of skin: Secondary | ICD-10-CM | POA: Insufficient documentation

## 2014-02-12 LAB — CBC WITH DIFFERENTIAL/PLATELET
Basophils Absolute: 0 10*3/uL (ref 0.0–0.1)
Basophils Relative: 0.4 % (ref 0.0–3.0)
Eosinophils Absolute: 0.1 10*3/uL (ref 0.0–0.7)
Eosinophils Relative: 1.1 % (ref 0.0–5.0)
HCT: 43.7 % (ref 36.0–46.0)
Hemoglobin: 14.3 g/dL (ref 12.0–15.0)
Lymphocytes Relative: 24.4 % (ref 12.0–46.0)
Lymphs Abs: 1.4 10*3/uL (ref 0.7–4.0)
MCHC: 32.7 g/dL (ref 30.0–36.0)
MCV: 86.3 fl (ref 78.0–100.0)
Monocytes Absolute: 0.6 10*3/uL (ref 0.1–1.0)
Monocytes Relative: 9.7 % (ref 3.0–12.0)
Neutro Abs: 3.7 10*3/uL (ref 1.4–7.7)
Neutrophils Relative %: 64.4 % (ref 43.0–77.0)
Platelets: 290 10*3/uL (ref 150.0–400.0)
RBC: 5.06 Mil/uL (ref 3.87–5.11)
RDW: 13.3 % (ref 11.5–15.5)
WBC: 5.8 10*3/uL (ref 4.0–10.5)

## 2014-02-12 LAB — POCT URINALYSIS DIPSTICK
Bilirubin, UA: NEGATIVE
Blood, UA: NEGATIVE
Glucose, UA: NEGATIVE
Ketones, UA: NEGATIVE
Leukocytes, UA: NEGATIVE
Nitrite, UA: NEGATIVE
Protein, UA: NEGATIVE
Spec Grav, UA: 1.025
Urobilinogen, UA: 0.2
pH, UA: 5.5

## 2014-02-12 LAB — HEPATIC FUNCTION PANEL
ALT: 16 U/L (ref 0–35)
AST: 23 U/L (ref 0–37)
Albumin: 4.5 g/dL (ref 3.5–5.2)
Alkaline Phosphatase: 75 U/L (ref 39–117)
Bilirubin, Direct: 0.1 mg/dL (ref 0.0–0.3)
Total Bilirubin: 0.8 mg/dL (ref 0.2–1.2)
Total Protein: 7.5 g/dL (ref 6.0–8.3)

## 2014-02-12 LAB — BASIC METABOLIC PANEL
BUN: 21 mg/dL (ref 6–23)
CO2: 26 mEq/L (ref 19–32)
Calcium: 9.4 mg/dL (ref 8.4–10.5)
Chloride: 107 mEq/L (ref 96–112)
Creatinine, Ser: 0.7 mg/dL (ref 0.4–1.2)
GFR: 85.89 mL/min (ref >60.00)
Glucose, Bld: 99 mg/dL (ref 70–99)
Potassium: 4.1 mEq/L (ref 3.5–5.1)
Sodium: 142 mEq/L (ref 135–145)

## 2014-02-12 LAB — TSH: TSH: 1.7 u[IU]/mL (ref 0.35–4.50)

## 2014-02-12 NOTE — Progress Notes (Signed)
   Subjective:    Patient ID: Carmen [name], female    DOB: [birth year], 74 y.o.   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 She's due for follow-up. She does not get any sun exposure now because she's had 2 melanomas. We'll also check a vitamin D level. In September she had a right corneal transplant followed by a left corneal transplant in October [facility]. She's done well still on the steroid eyedrops. She's been followed every 3 months in dermatology because of melanomas.  She gets routine eye care, dental care, BSE monthly, and you mammography, colonoscopy 2009 normal.  She's due a flu shot.  Cognitive function normal she does not exercise on a regular basis. Home health safety reviewed no issues identified, no guns in the house, she does have a healthcare power of attorney and living well   Review of Systems  Constitutional: Negative.   HENT: Negative.   Eyes: Negative.   Respiratory: Negative.   Cardiovascular: Negative.   Gastrointestinal: Negative.   Endocrine: Negative.   Genitourinary: Negative.   Musculoskeletal: Negative.   Skin: Negative.   Allergic/Immunologic: Negative.   Neurological: Negative.   Hematological: Negative.   Psychiatric/Behavioral: Negative.        Objective:   Physical Exam  Constitutional: She is oriented to person, place, and time. She appears well-developed and well-nourished.  HENT:  Head: Normocephalic and atraumatic.  Right Ear: External ear normal.  Left Ear: External ear normal.  Nose: Nose normal.  Mouth/Throat: Oropharynx is clear and moist.  Eyes: EOM are normal. Pupils are equal, round, and reactive to light.  Neck: Normal range of motion. Neck supple. No JVD present. No tracheal deviation present. No thyromegaly present.  Cardiovascular:  Normal rate, regular rhythm, normal heart sounds and intact distal pulses.  Exam reveals no gallop and no friction rub.   No murmur heard. Pulmonary/Chest: Effort normal and breath sounds normal. No stridor. No respiratory distress. She has no wheezes. She has no rales. She exhibits no tenderness.  Abdominal: Soft. Bowel sounds are normal. She exhibits no distension and no mass. There is no tenderness. There is no rebound and no guarding.  Genitourinary: Vagina normal and uterus normal. Guaiac negative stool. No vaginal discharge found.  Bilateral breast implants no palpable masses  Musculoskeletal: Normal range of motion.  Lymphadenopathy:    She has no cervical adenopathy.  Neurological: She is alert and oriented to person, place, and time. She has normal reflexes. No cranial nerve deficit. She exhibits normal muscle tone. Coordination normal.  Skin: Skin is warm and dry. No rash noted. No erythema. No pallor.  Total body skin exam normal except for scars from previous melanoma removal  Psychiatric: She has a normal mood and affect. Her behavior is normal. Judgment and thought content normal.          Assessment & Plan:  Healthy female  History of melanomas 2........ continue dermatologic follow-up every 3-4 months as outlined by them. Again avoids any sun exposure  Status post bilateral corneal transplants fall 2015......... continue eyedrops as outlined by ophthalmology  History of osteopenia......... last bone density 2012.......Marland Kitchen bone density this year.

## 2014-02-12 NOTE — Progress Notes (Signed)
Pre visit review using our clinic review tool, if applicable. No additional management support is needed unless otherwise documented below in the visit note. 

## 2014-02-12 NOTE — Patient Instructions (Signed)
Walk 30 minutes daily  We will set up a follow bone density  Return in one year sooner if any problem

## 2014-02-13 LAB — CYTOLOGY - PAP

## 2014-02-14 LAB — VITAMIN D 1,25 DIHYDROXY
Vitamin D 1, 25 (OH)2 Total: 66 pg/mL (ref 18–72)
Vitamin D2 1, 25 (OH)2: <8 pg/mL
Vitamin D3 1, 25 (OH)2: 66 pg/mL

## 2014-03-04 ENCOUNTER — Ambulatory Visit (INDEPENDENT_AMBULATORY_CARE_PROVIDER_SITE_OTHER)
Admission: RE | Admit: 2014-03-04 | Discharge: 2014-03-04 | Disposition: A | Discharge status: Home / Self Care | Payer: Medicare Other | Source: Ambulatory Visit | Attending: Family Medicine | Admitting: Family Medicine

## 2014-03-04 DIAGNOSIS — Z7952 Long term (current) use of systemic steroids: Secondary | ICD-10-CM

## 2014-03-04 DIAGNOSIS — H4011X2 Primary open-angle glaucoma, moderate stage: Secondary | ICD-10-CM | POA: Diagnosis not present

## 2014-03-06 ENCOUNTER — Other Ambulatory Visit: Payer: Medicare Other

## 2014-03-18 ENCOUNTER — Ambulatory Visit (INDEPENDENT_AMBULATORY_CARE_PROVIDER_SITE_OTHER): Payer: Medicare Other | Admitting: Family Medicine

## 2014-03-18 VITALS — BP 120/80

## 2014-03-18 DIAGNOSIS — M720 Palmar fascial fibromatosis [Dupuytren]: Secondary | ICD-10-CM | POA: Diagnosis not present

## 2014-03-18 NOTE — Progress Notes (Signed)
Pre visit review using our clinic review tool, if applicable. No additional management support is needed unless otherwise documented below in the visit note. 

## 2014-03-18 NOTE — Patient Instructions (Signed)
Exercise program outlined  Hand surgery when necessary

## 2014-03-18 NOTE — Progress Notes (Signed)
   Subjective:    Patient ID: Carmen [name], female    DOB: 1940/12/17, 74 y.o.   MRN: [id number]  HPI Carmen [name] is a 74 year old married female who comes in today for evaluation of a knot on her left hand  She's notices over the cast couple months is seen to gun bigger. She has full range of motion. It's her left ring finger   Review of Systems Review of systems otherwise negative    Objective:   Physical Exam  Well-developed well-nourished female no acute distress vital signs stable she's afebrile examination left hand shows a knot on the flexor tendon classic Dupuytren's      Assessment & Plan:  Dupuytren's contracture left ring finger,,,,,,,,,,,,, exercise program,,,,, hand surgery when necessary

## 2014-03-29 ENCOUNTER — Telehealth: Payer: Self-pay | Admitting: Family Medicine

## 2014-03-29 ENCOUNTER — Telehealth: Payer: Self-pay | Admitting: *Deleted

## 2014-03-29 NOTE — Telephone Encounter (Signed)
See below

## 2014-03-29 NOTE — Telephone Encounter (Signed)
Comments User: Marcy Salvo, RN Date/Time Eilene Ghazi Time): 03/29/2014 10:30:53 AM Patient is scheduled for appointment on Monday and advised to seek help at [facility] to [place]. She is going to be seen if they have a clinic. Benadryl or motion sickness medication would be something over the counter to look into. [facility] Referrals REFERRED [facility] - Client TELEPHONE [facility]  Patient Name: Carmen [name]  Gender: Female  DOB: Jul 19, 1940   Age: 74 Y 7 M 2 D  Return Phone Number: [phone] (Primary)  [place]   [place] [address]   Client [facility] - Client  Client Site [facility] - Day  Physician Todd, [place] Type Call  Call Type Triage / Clinical  Caller Name [name]  Relationship To Patient Spouse     Return Phone Number [phone] (Primary)  Chief Complaint Prescription Refill or Medication Request (non symptomatic)  Initial Comment Caller states his wife is needing a RX for zynex               Nurse Assessment  Nurse: Shawn Stall, RN, Trish Date/Time (Eastern Time): 03/29/2014 11:17:29 AM  Confirm and document reason for call. If symptomatic, describe symptoms. ---called back to ask for xanax per pharmacist in [place] that is what they need patient informed that this type of medication needed a hand written script and evaluation. Patient states pharmacist states hand written script not needed.  Has the patient traveled out of the country within the last 30 days? ---Not Applicable  Does the patient require triage? ---Declined Triage  Please document clinical information provided and list any resource used. ---this RN called office RN she will take over this call     Guidelines      Guideline Title Affirmed Question Affirmed Notes Nurse Date/Time (Eastern Time)         Disp. Time Eilene Ghazi Time) Disposition Final User    03/29/2014 10:58:01 AM Send To RN Personal  Bobby Rumpf          After Care Instructions Given     Call Event Type User Date / Time

## 2014-03-29 NOTE — Telephone Encounter (Signed)
Spoke with husband and explained that Dr Sherren Mocha does not prescribe Xanax and he was out of the office.  Patient will try benadryl and go to the Saturday clinic.

## 2014-03-29 NOTE — Telephone Encounter (Signed)
[facility] - Client [facility] Call Center  Patient Name: Carmen [name]  Gender: Female  DOB: 05/18/1940   Age: 74 Y 33 M 2 D  Return Phone Number: [phone] (Primary)  Address:   [place] [address]   Client [facility] - Client  Client Site [facility] - Day  Physician Christie Nottingham   Contact Type Call  Call Type Triage / Clinical  Caller Name [name]  Relationship To Patient Spouse     Return Phone Number [phone] (Primary)  Chief Complaint Diarrhea  Initial Comment Caller states his wife is not eating, nervous, and has diarrhea. Caller states they are currently vacationing in [facility] and will be flying back later today. Caller wants to know what he can get for her over the counter.     PreDisposition Call Doctor         Nurse Assessment  Nurse: Shawn Stall, RN, Trish Date/Time Eilene Ghazi Time): 03/29/2014 9:50:34 AM  Confirm and document reason for call. If symptomatic, describe symptoms. ---Husband is calling for wife. Caller states his wife is not eating, nervous, and has diarrhea. Wife is having confusion last 2-3 days. Caller states they are currently vacationing in [facility] and will be flying back later today. Caller wants to know what he can get for her over the counter.  Has the patient traveled out of the country within the last 30 days? ---No  Does the patient require triage? ---Yes  Related visit to physician within the last 2 weeks? ---Yes  Does the PT have any chronic conditions? (i.e. diabetes, asthma, etc.) ---Yes  List chronic conditions. ---had surgery in September corneal transplant. Patient has been feeling weird since has been having a lot of anxiety since surgery. She can't eat. Her eyes are fine. She just does not feel like herself.     Guidelines      Guideline Title Affirmed Question Affirmed Notes Nurse Date/Time (Eastern Time)   Anxiety and Panic Attack Symptoms interfere with work or school  [place], [place], Wannetta Sender 03/29/2014 9:56:33 AM   Disp. Time Eilene Ghazi Time) Disposition Final User          03/29/2014 9:59:27 AM See Physician within 24 Hours Yes Shawn Stall, RN, Trish        Caller Understands: Yes  Disagree/Comply: Comply     Care Advice Given Per Guideline      SEE PHYSICIAN WITHIN 24 HOURS: * Sleep - Try to get sufficient amount of sleep. Most people need 7-8 hours of sleep each night. HEALTHY LIFESTYLE BASICS: * Regular exercise will improve your overall health, improve your mood, and is a simple method to reduce stress. * Eat a balanced healthy diet. * Drink adequate liquids - 6-8 glasses of water daily. * You feel like harming yourself * You become worse. CARE ADVICE given per Anxiety and Panic Attack (Adult) guideline. * Talk to your doctor before taking any herbal supplements (Reason: some have side effects) * Avoid diet pills (Reason: they act as stimulants.) AVOID TRIGGERS OF ANXIETY:   After Care Instructions Given     Call Event Type User Date / Time Description        Referrals  REFERRED TO PCP OFFICE

## 2014-03-30 ENCOUNTER — Encounter: Payer: Self-pay | Admitting: Internal Medicine

## 2014-03-30 ENCOUNTER — Ambulatory Visit (INDEPENDENT_AMBULATORY_CARE_PROVIDER_SITE_OTHER): Payer: Medicare Other | Admitting: Internal Medicine

## 2014-03-30 VITALS — BP 138/80 | HR 89 | Temp 98.1°F | Ht 62.75 in | Wt 122.5 lb

## 2014-03-30 DIAGNOSIS — F410 Panic disorder [episodic paroxysmal anxiety] without agoraphobia: Secondary | ICD-10-CM | POA: Diagnosis not present

## 2014-03-30 DIAGNOSIS — F4322 Adjustment disorder with anxiety: Secondary | ICD-10-CM

## 2014-03-30 DIAGNOSIS — R110 Nausea: Secondary | ICD-10-CM

## 2014-03-30 NOTE — Patient Instructions (Signed)
This acts like escalating panic and anxiety .  Poss ptsd type . Trigger. Keep appt with dr Sherren Mocha I would advise  counseling and  Medication  Meant for panic disorder .  Such as  Ssri . Low dose   Have to get out of the catastrophic  thinking.

## 2014-03-30 NOTE — Progress Notes (Signed)
Chief Complaint  Patient presents with  . Diarrhea    Had N/V on Wednesday while in [facility] & had loss of appetite & diarrhea. Came home last night. Had to take Benadryl to calm down anxiety just to fly home yesterday. Has appt with Dr. Sherren Mocha on Monday  . unable to eat    HPI: Patient comes in today for [facility] Saturday clinic for   problem evaluation. See note  pcp dr Sherren Mocha.   Here wqith husband  Just got in from [place] . Ever since eye surgery in fall has had exterme anxiety about most things ans has had  disoriented and panic  And nervousness and anxiety . since surgery .  [place] and  Was very anxious about getting on plain to sued  Benadryl  . Her gi sx are gone and she feels was anxiety .  Had corneal transplants and since then not sleeping as well  And not eating as well.  September  24 15  And October  15 . Controlled glaucoma  . balloon transplant  Had post anesthesia  Headache .     Local dr Eben Burow  And hard to get over  The anxiety  Severe panic disorder .  Panic   Has never had this before .   No sig etoh caffiene ROS: See pertinent positives and negatives per HPI. No cp sob syncope fever other sx as above.  Past Medical History  Diagnosis Date  . GLAUCOMA NOS 09/23/2009  . CERUMEN IMPACTION 05/27/2008  . DERMATITIS DUE TO SOLAR RADIATION 05/03/2007  . Inflamed seborrheic keratosis 12/26/2009  . Dyshidrosis 05/03/2007  . FOOT PAIN, LEFT 08/16/2007  . OSTEOPENIA 05/03/2007  . Tinnitus   . Heart murmur   . Bleb     X 4    Family History  Problem Relation Age of Onset  . Cancer Other     breast  . Stroke Other     stroke  . Heart disease Other     History   Social History  . Marital Status: Married    Spouse Name: N/A  . Number of Children: N/A  . Years of Education: N/A   Social History Main Topics  . Smoking status: Never Smoker   . Smokeless tobacco: Not on file  . Alcohol Use: Not on file  . Drug Use: Not on file  . Sexual Activity: Not on  file   Other Topics Concern  . None   Social History Narrative    Outpatient Encounter Prescriptions as of 03/30/2014  Medication Sig  . calcium-vitamin D 250-100 MG-UNIT per tablet Take 1 tablet by mouth 2 (two) times daily.  Marland Kitchen LUMIGAN 0.01 % SOLN   . Omega-3 Fatty Acids (OMEGA 3 PO) Take 2 capsules by mouth daily.  . prednisoLONE acetate (PRED FORTE) 1 % ophthalmic suspension 1 drop 4 (four) times daily.    EXAM:  BP 138/80 mmHg  Pulse 89  Temp(Src) 98.1 F (36.7 C) (Oral)  Ht 5' 2.75" (1.594 m)  Wt 122 lb 8 oz (55.566 kg)  BMI 21.87 kg/m2  SpO2 97%  Body mass index is 21.87 kg/(m^2).  GENERAL: vitals reviewed and listed above, alert, oriented, appears well hydrated and in no acute distress HEENT: atraumatic, conjunctiva  clear, no obvious abnormalities on inspection of external nose and ears  NECK: no obvious masses on inspection palpation  LUNGS: clear to auscultation bilaterally, no wheezes, rales or rhonchi, good air movement Abdomen:  Sof,t  normal bowel sounds without hepatosplenomegaly, no guarding rebound or masses no CVA tenderness CV: HRRR, no clubbing cyanosis or  peripheral edema nl cap refill  MS: moves all extremities without noticeable focal  abnormality PSYCH:    oriented  Nl speech  Mild anxiety  No tremor Wt Readings from Last 3 Encounters:  03/30/14 122 lb 8 oz (55.566 kg)  02/12/14 125 lb (56.7 kg)  11/29/13 126 lb (57.153 kg)    ASSESSMENT AND PLAN:  Discussed the following assessment and plan:  Adjustment disorder with anxious mood - triggered by her eye condition surgery  escalating  and causing physical sx at this time   Panic attacks  Nausea Keep appt with dr Sherren Mocha  Consider med and counseling to halt the progression of her panic anxiety.  -Patient advised to return or notify health care team  if symptoms worsen ,persist or new concerns arise. No meds today   Best managed but dr Sherren Mocha.  Total visit 11mins > 50% spent counseling and  coordinating care   Patient Instructions  This acts like escalating panic and anxiety .  Poss ptsd type . Trigger. Keep appt with dr Sherren Mocha I would advise  counseling and  Medication  Meant for panic disorder .  Such as  Ssri . Low dose   Have to get out of the catastrophic  thinking.       Standley Brooking. Lamark Schue M.D.

## 2014-03-30 NOTE — Progress Notes (Signed)
Pre visit review using our clinic review tool, if applicable. No additional management support is needed unless otherwise documented below in the visit note. 

## 2014-04-01 ENCOUNTER — Ambulatory Visit (INDEPENDENT_AMBULATORY_CARE_PROVIDER_SITE_OTHER): Payer: Medicare Other | Admitting: Family Medicine

## 2014-04-01 ENCOUNTER — Ambulatory Visit: Payer: Self-pay | Admitting: Family Medicine

## 2014-04-01 VITALS — BP 130/88 | Temp 98.4°F | Wt 122.0 lb

## 2014-04-01 DIAGNOSIS — F411 Generalized anxiety disorder: Secondary | ICD-10-CM | POA: Insufficient documentation

## 2014-04-01 MED ORDER — CITALOPRAM HYDROBROMIDE 10 MG PO TABS: 10.0000 mg | ORAL_TABLET | Freq: Every day | ORAL | Status: DC

## 2014-04-01 MED ORDER — LORAZEPAM 0.5 MG PO TABS
0.5000 mg | ORAL_TABLET | Freq: Two times a day (BID) | ORAL | Status: DC | PRN
Start: 2014-04-01 — End: 2016-04-01

## 2014-04-01 NOTE — Progress Notes (Signed)
   Subjective:    Patient ID: Carmen [name], female    DOB: 06/15/1940, 74 y.o.   MRN: [id number]  HPI Carmen [name] is a 74 year old married female who comes in today coming by her husband and daughter for evaluation of anxiety  Her husband initially did most of the talking. He tells me then after first eye exam she developed some secondary glaucoma her I had to be drained and since that time she's had anxiety. Little things become big things and she can't let him go. She's also have an difficulty sleeping.  No history of depression  Family history positive for mother had the same syndrome   Review of Systems Review of systems otherwise negative    Objective:   Physical Exam  Well-developed well-nourished female no acute distress vital signs stable she's afebrile she's oriented 3 alert not depressed      Assessment & Plan:  Gen. anxiety disorder.........Marland Kitchen begin Celexa and low-dose Ativan

## 2014-04-01 NOTE — Progress Notes (Signed)
Pre visit review using our clinic review tool, if applicable. No additional management support is needed unless otherwise documented below in the visit note. 

## 2014-04-01 NOTE — Patient Instructions (Signed)
Celexa 10 mg........ one daily at bedtime  Ativan 0.5 mg.......Marland Kitchen 1 at bedtime....... one during the day when necessary  Dr. Letta Moynahan in [facility].......... phone number [phone]  [place].... [phone]

## 2014-04-23 DIAGNOSIS — F430 Acute stress reaction: Secondary | ICD-10-CM | POA: Diagnosis not present

## 2014-04-24 ENCOUNTER — Telehealth: Payer: Self-pay | Admitting: *Deleted

## 2014-04-24 NOTE — Telephone Encounter (Signed)
Patient would like to know if any of her medications should be adjusted?

## 2014-04-25 NOTE — Telephone Encounter (Signed)
Left message on machine for patient to continue her medication unless changed by Dr Redmond Pulling per Dr Sherren Mocha

## 2014-05-07 DIAGNOSIS — F430 Acute stress reaction: Secondary | ICD-10-CM | POA: Diagnosis not present

## 2014-05-17 DIAGNOSIS — L570 Actinic keratosis: Secondary | ICD-10-CM | POA: Diagnosis not present

## 2014-05-17 DIAGNOSIS — L918 Other hypertrophic disorders of the skin: Secondary | ICD-10-CM | POA: Diagnosis not present

## 2014-05-17 DIAGNOSIS — L720 Epidermal cyst: Secondary | ICD-10-CM | POA: Diagnosis not present

## 2014-05-17 DIAGNOSIS — L821 Other seborrheic keratosis: Secondary | ICD-10-CM | POA: Diagnosis not present

## 2014-05-17 DIAGNOSIS — L814 Other melanin hyperpigmentation: Secondary | ICD-10-CM | POA: Diagnosis not present

## 2014-05-17 DIAGNOSIS — Z85828 Personal history of other malignant neoplasm of skin: Secondary | ICD-10-CM | POA: Diagnosis not present

## 2014-05-17 DIAGNOSIS — Z85820 Personal history of malignant melanoma of skin: Secondary | ICD-10-CM | POA: Diagnosis not present

## 2014-05-17 DIAGNOSIS — L820 Inflamed seborrheic keratosis: Secondary | ICD-10-CM | POA: Diagnosis not present

## 2014-05-22 DIAGNOSIS — F430 Acute stress reaction: Secondary | ICD-10-CM | POA: Diagnosis not present

## 2014-06-18 ENCOUNTER — Telehealth: Payer: Self-pay | Admitting: Family Medicine

## 2014-06-18 NOTE — Telephone Encounter (Signed)
Pt would like to speak to rachel concerning celexa med

## 2014-06-19 NOTE — Telephone Encounter (Signed)
Patient has stopped taking the Ativan.  She would like to stop the Celexa in 2 weeks.  She would like to know if she should taper the celexa or just stop?

## 2014-06-21 NOTE — Telephone Encounter (Signed)
Patient should taper per Dr Sherren Mocha.  Left detailed message on machine for patient.

## 2014-07-30 DIAGNOSIS — F430 Acute stress reaction: Secondary | ICD-10-CM | POA: Diagnosis not present

## 2014-08-02 DIAGNOSIS — M720 Palmar fascial fibromatosis [Dupuytren]: Secondary | ICD-10-CM | POA: Diagnosis not present

## 2014-09-11 DIAGNOSIS — Z961 Presence of intraocular lens: Secondary | ICD-10-CM | POA: Diagnosis not present

## 2014-09-11 DIAGNOSIS — H4011X1 Primary open-angle glaucoma, mild stage: Secondary | ICD-10-CM | POA: Diagnosis not present

## 2014-09-11 DIAGNOSIS — H1851 Endothelial corneal dystrophy: Secondary | ICD-10-CM | POA: Diagnosis not present

## 2014-09-11 DIAGNOSIS — H52203 Unspecified astigmatism, bilateral: Secondary | ICD-10-CM | POA: Diagnosis not present

## 2014-09-17 DIAGNOSIS — Z1231 Encounter for screening mammogram for malignant neoplasm of breast: Secondary | ICD-10-CM | POA: Diagnosis not present

## 2014-09-17 LAB — HM MAMMOGRAPHY: HM Mammogram: NORMAL

## 2014-09-19 ENCOUNTER — Encounter: Payer: Self-pay | Admitting: Family Medicine

## 2014-09-27 DIAGNOSIS — Z85828 Personal history of other malignant neoplasm of skin: Secondary | ICD-10-CM | POA: Diagnosis not present

## 2014-09-27 DIAGNOSIS — L570 Actinic keratosis: Secondary | ICD-10-CM | POA: Diagnosis not present

## 2014-09-27 DIAGNOSIS — L259 Unspecified contact dermatitis, unspecified cause: Secondary | ICD-10-CM | POA: Diagnosis not present

## 2014-11-18 DIAGNOSIS — Z85820 Personal history of malignant melanoma of skin: Secondary | ICD-10-CM | POA: Diagnosis not present

## 2014-11-18 DIAGNOSIS — D2239 Melanocytic nevi of other parts of face: Secondary | ICD-10-CM | POA: Diagnosis not present

## 2014-11-18 DIAGNOSIS — Z85828 Personal history of other malignant neoplasm of skin: Secondary | ICD-10-CM | POA: Diagnosis not present

## 2014-11-18 DIAGNOSIS — L821 Other seborrheic keratosis: Secondary | ICD-10-CM | POA: Diagnosis not present

## 2014-11-18 DIAGNOSIS — L814 Other melanin hyperpigmentation: Secondary | ICD-10-CM | POA: Diagnosis not present

## 2014-11-28 DIAGNOSIS — Z23 Encounter for immunization: Secondary | ICD-10-CM | POA: Diagnosis not present

## 2014-12-31 ENCOUNTER — Encounter: Payer: Self-pay | Admitting: Internal Medicine

## 2015-02-17 ENCOUNTER — Encounter: Payer: Medicare Other | Admitting: Family Medicine

## 2015-02-20 ENCOUNTER — Encounter: Payer: Self-pay | Admitting: Family Medicine

## 2015-02-20 ENCOUNTER — Ambulatory Visit (INDEPENDENT_AMBULATORY_CARE_PROVIDER_SITE_OTHER): Payer: Medicare Other | Admitting: Family Medicine

## 2015-02-20 VITALS — BP 110/70 | Temp 99.2°F | Ht 63.0 in | Wt 121.0 lb

## 2015-02-20 DIAGNOSIS — Z0000 Encounter for general adult medical examination without abnormal findings: Secondary | ICD-10-CM | POA: Diagnosis not present

## 2015-02-20 DIAGNOSIS — Z85820 Personal history of malignant melanoma of skin: Secondary | ICD-10-CM | POA: Diagnosis not present

## 2015-02-20 LAB — CBC WITH DIFFERENTIAL/PLATELET
Basophils Absolute: 0 10*3/uL (ref 0.0–0.1)
Basophils Relative: 0.3 % (ref 0.0–3.0)
Eosinophils Absolute: 0.1 10*3/uL (ref 0.0–0.7)
Eosinophils Relative: 0.7 % (ref 0.0–5.0)
HCT: 43.8 % (ref 36.0–46.0)
Hemoglobin: 14.3 g/dL (ref 12.0–15.0)
Lymphocytes Relative: 13.2 % (ref 12.0–46.0)
Lymphs Abs: 1.4 10*3/uL (ref 0.7–4.0)
MCHC: 32.7 g/dL (ref 30.0–36.0)
MCV: 86 fl (ref 78.0–100.0)
Monocytes Absolute: 0.8 10*3/uL (ref 0.1–1.0)
Monocytes Relative: 7.1 % (ref 3.0–12.0)
Neutro Abs: 8.5 10*3/uL — ABNORMAL HIGH (ref 1.4–7.7)
Neutrophils Relative %: 78.7 % — ABNORMAL HIGH (ref 43.0–77.0)
Platelets: 273 10*3/uL (ref 150.0–400.0)
RBC: 5.1 Mil/uL (ref 3.87–5.11)
RDW: 13.2 % (ref 11.5–15.5)
WBC: 10.8 10*3/uL — ABNORMAL HIGH (ref 4.0–10.5)

## 2015-02-20 LAB — BASIC METABOLIC PANEL
BUN: 24 mg/dL — ABNORMAL HIGH (ref 6–23)
CO2: 29 mEq/L (ref 19–32)
Calcium: 9.7 mg/dL (ref 8.4–10.5)
Chloride: 105 mEq/L (ref 96–112)
Creatinine, Ser: 0.8 mg/dL (ref 0.40–1.20)
GFR: 74.63 mL/min (ref >60.00)
Glucose, Bld: 104 mg/dL — ABNORMAL HIGH (ref 70–99)
Potassium: 4.8 mEq/L (ref 3.5–5.1)
Sodium: 143 mEq/L (ref 135–145)

## 2015-02-20 LAB — POCT URINALYSIS DIPSTICK
Blood, UA: NEGATIVE
Glucose, UA: NEGATIVE
Leukocytes, UA: NEGATIVE
Nitrite, UA: NEGATIVE
Protein, UA: NEGATIVE
Spec Grav, UA: 1.025
Urobilinogen, UA: 0.2
pH, UA: 5.5

## 2015-02-20 LAB — TSH: TSH: 1.79 u[IU]/mL (ref 0.35–4.50)

## 2015-02-20 MED ORDER — HYDROCODONE-HOMATROPINE 5-1.5 MG/5ML PO SYRP: 5.0000 mL | ORAL_SOLUTION | Freq: Three times a day (TID) | ORAL | Status: DC | PRN

## 2015-02-20 NOTE — Progress Notes (Signed)
   Subjective:    Patient ID: Carmen [name], female    DOB: [birth year], 75 y.o.   MRN: [id number]  HPI Carmen [name] is a 75 year old married female nonsmoker who comes in today for general physical examination  She uses eyedrops from her ophthalmologist because of a history of glaucoma  Which she had her last procedure last year she developed acute anxiety after the procedure. In hindsight she had anesthesia 4 over couple weeks which he thinks triggered the anxiety. She was on Celexa 10 mg daily along with Ativan when necessary. She stopped her medication she feels well.  She gets routine eye care, dental care, BSE monthly, and you mammography, colonoscopy 2009 was normal. Vaccinations up-to-date except she's doing shingles vaccine  Last Pap was last year normal therefore not repeated she is asymptomatic  She sees her dermatologist Dr. Martinique on a regular basis. She's had 2 melanomas 100 back and one on her chest. She does have light skin and light eyes.  Cognitive function normal she exercises daily home health safety reviewed no issues identified, no guns in the house, she does have a healthcare power of attorney and living well   Review of Systems  Constitutional: Negative.   HENT: Negative.   Eyes: Negative.   Respiratory: Negative.   Cardiovascular: Negative.   Gastrointestinal: Negative.   Endocrine: Negative.   Genitourinary: Negative.   Musculoskeletal: Negative.   Skin: Negative.   Allergic/Immunologic: Negative.   Neurological: Negative.   Hematological: Negative.   Psychiatric/Behavioral: Negative.        Objective:   Physical Exam  Constitutional: She is oriented to person, place, and time. She appears well-developed and well-nourished.  HENT:  Head: Normocephalic and atraumatic.  Right Ear: External ear normal.  Left Ear: External ear normal.  Nose: Nose normal.  Mouth/Throat: Oropharynx is clear and moist.  Eyes: EOM are normal. Pupils are equal, round, and  reactive to light.  Neck: Normal range of motion. Neck supple. No JVD present. No tracheal deviation present. No thyromegaly present.  Cardiovascular: Normal rate, regular rhythm, normal heart sounds and intact distal pulses.  Exam reveals no gallop and no friction rub.   No murmur heard. Pulmonary/Chest: Effort normal and breath sounds normal. No stridor. No respiratory distress. She has no wheezes. She has no rales. She exhibits no tenderness.  Abdominal: Soft. Bowel sounds are normal. She exhibits no distension and no mass. There is no tenderness. There is no rebound and no guarding.  Genitourinary:  No palpable masses bilateral breast implants she had a reduction because of a family history of breast cancer years ago.  Musculoskeletal: Normal range of motion.  Lymphadenopathy:    She has no cervical adenopathy.  Neurological: She is alert and oriented to person, place, and time. She has normal reflexes. No cranial nerve deficit. She exhibits normal muscle tone. Coordination normal.  Skin: Skin is warm and dry. No rash noted. No erythema. No pallor.  Total body skin exam normal except for scars from previous lesions removed  Psychiatric: She has a normal mood and affect. Her behavior is normal. Judgment and thought content normal.  Nursing note and vitals reviewed.         Assessment & Plan:  Healthy female  History melanoma,,,,,,,, normal skin exam  History of anxiety reaction probably secondary to repeated anesthesia.  History of glaucoma,,,,,,,, continue eyedrops follow-up by ophthalmologist.

## 2015-02-20 NOTE — Progress Notes (Signed)
Pre visit review using our clinic review tool, if applicable. No additional management support is needed unless otherwise documented below in the visit note. 

## 2015-02-20 NOTE — Patient Instructions (Signed)
Continue current diet exercise calcium vitamin D  Return in one year for general physical exam sooner if any problem

## 2015-03-12 DIAGNOSIS — Z961 Presence of intraocular lens: Secondary | ICD-10-CM | POA: Diagnosis not present

## 2015-03-12 DIAGNOSIS — H401131 Primary open-angle glaucoma, bilateral, mild stage: Secondary | ICD-10-CM | POA: Diagnosis not present

## 2015-03-12 DIAGNOSIS — Z0100 Encounter for examination of eyes and vision without abnormal findings: Secondary | ICD-10-CM | POA: Diagnosis not present

## 2015-04-23 DIAGNOSIS — H1851 Endothelial corneal dystrophy: Secondary | ICD-10-CM | POA: Diagnosis not present

## 2015-04-23 DIAGNOSIS — H401122 Primary open-angle glaucoma, left eye, moderate stage: Secondary | ICD-10-CM | POA: Diagnosis not present

## 2015-04-23 DIAGNOSIS — H401111 Primary open-angle glaucoma, right eye, mild stage: Secondary | ICD-10-CM | POA: Diagnosis not present

## 2015-05-20 DIAGNOSIS — D225 Melanocytic nevi of trunk: Secondary | ICD-10-CM | POA: Diagnosis not present

## 2015-05-20 DIAGNOSIS — D1801 Hemangioma of skin and subcutaneous tissue: Secondary | ICD-10-CM | POA: Diagnosis not present

## 2015-05-20 DIAGNOSIS — Z85820 Personal history of malignant melanoma of skin: Secondary | ICD-10-CM | POA: Diagnosis not present

## 2015-05-20 DIAGNOSIS — Z85828 Personal history of other malignant neoplasm of skin: Secondary | ICD-10-CM | POA: Diagnosis not present

## 2015-05-20 DIAGNOSIS — L570 Actinic keratosis: Secondary | ICD-10-CM | POA: Diagnosis not present

## 2015-05-20 DIAGNOSIS — L720 Epidermal cyst: Secondary | ICD-10-CM | POA: Diagnosis not present

## 2015-05-20 DIAGNOSIS — C44529 Squamous cell carcinoma of skin of other part of trunk: Secondary | ICD-10-CM | POA: Diagnosis not present

## 2015-05-20 DIAGNOSIS — L821 Other seborrheic keratosis: Secondary | ICD-10-CM | POA: Diagnosis not present

## 2015-05-28 DIAGNOSIS — H0011 Chalazion right upper eyelid: Secondary | ICD-10-CM | POA: Diagnosis not present

## 2015-06-06 DIAGNOSIS — H0011 Chalazion right upper eyelid: Secondary | ICD-10-CM | POA: Diagnosis not present

## 2015-07-01 DIAGNOSIS — L905 Scar conditions and fibrosis of skin: Secondary | ICD-10-CM | POA: Diagnosis not present

## 2015-07-01 DIAGNOSIS — Z85828 Personal history of other malignant neoplasm of skin: Secondary | ICD-10-CM | POA: Diagnosis not present

## 2015-07-10 DIAGNOSIS — H16101 Unspecified superficial keratitis, right eye: Secondary | ICD-10-CM | POA: Diagnosis not present

## 2015-07-15 DIAGNOSIS — H04123 Dry eye syndrome of bilateral lacrimal glands: Secondary | ICD-10-CM | POA: Diagnosis not present

## 2015-07-23 DIAGNOSIS — M720 Palmar fascial fibromatosis [Dupuytren]: Secondary | ICD-10-CM | POA: Insufficient documentation

## 2015-07-31 DIAGNOSIS — Z9882 Breast implant status: Secondary | ICD-10-CM | POA: Diagnosis not present

## 2015-07-31 DIAGNOSIS — T8541XA Breakdown (mechanical) of breast prosthesis and implant, initial encounter: Secondary | ICD-10-CM | POA: Diagnosis not present

## 2015-07-31 DIAGNOSIS — Z882 Allergy status to sulfonamides status: Secondary | ICD-10-CM | POA: Diagnosis not present

## 2015-07-31 DIAGNOSIS — Z79899 Other long term (current) drug therapy: Secondary | ICD-10-CM | POA: Diagnosis not present

## 2015-07-31 DIAGNOSIS — H409 Unspecified glaucoma: Secondary | ICD-10-CM | POA: Diagnosis not present

## 2015-08-18 DIAGNOSIS — T8549XA Other mechanical complication of breast prosthesis and implant, initial encounter: Secondary | ICD-10-CM | POA: Diagnosis not present

## 2015-08-18 DIAGNOSIS — Z9882 Breast implant status: Secondary | ICD-10-CM | POA: Diagnosis not present

## 2015-09-16 DIAGNOSIS — H401111 Primary open-angle glaucoma, right eye, mild stage: Secondary | ICD-10-CM | POA: Diagnosis not present

## 2015-09-16 DIAGNOSIS — H1851 Endothelial corneal dystrophy: Secondary | ICD-10-CM | POA: Diagnosis not present

## 2015-09-16 DIAGNOSIS — H401122 Primary open-angle glaucoma, left eye, moderate stage: Secondary | ICD-10-CM | POA: Diagnosis not present

## 2015-09-17 DIAGNOSIS — H04123 Dry eye syndrome of bilateral lacrimal glands: Secondary | ICD-10-CM | POA: Diagnosis not present

## 2015-09-18 DIAGNOSIS — Z1231 Encounter for screening mammogram for malignant neoplasm of breast: Secondary | ICD-10-CM | POA: Diagnosis not present

## 2015-09-18 DIAGNOSIS — Z803 Family history of malignant neoplasm of breast: Secondary | ICD-10-CM | POA: Diagnosis not present

## 2015-09-18 LAB — HM MAMMOGRAPHY

## 2015-10-03 ENCOUNTER — Other Ambulatory Visit: Payer: Self-pay

## 2015-10-16 ENCOUNTER — Encounter: Payer: Self-pay | Admitting: Family Medicine

## 2015-11-03 DIAGNOSIS — L821 Other seborrheic keratosis: Secondary | ICD-10-CM | POA: Diagnosis not present

## 2015-11-03 DIAGNOSIS — Z85828 Personal history of other malignant neoplasm of skin: Secondary | ICD-10-CM | POA: Diagnosis not present

## 2015-11-03 DIAGNOSIS — D2239 Melanocytic nevi of other parts of face: Secondary | ICD-10-CM | POA: Diagnosis not present

## 2015-11-03 DIAGNOSIS — B029 Zoster without complications: Secondary | ICD-10-CM | POA: Diagnosis not present

## 2015-11-19 DIAGNOSIS — L650 Telogen effluvium: Secondary | ICD-10-CM | POA: Diagnosis not present

## 2015-11-19 DIAGNOSIS — B029 Zoster without complications: Secondary | ICD-10-CM | POA: Diagnosis not present

## 2015-11-19 DIAGNOSIS — Z85820 Personal history of malignant melanoma of skin: Secondary | ICD-10-CM | POA: Diagnosis not present

## 2015-11-19 DIAGNOSIS — D692 Other nonthrombocytopenic purpura: Secondary | ICD-10-CM | POA: Diagnosis not present

## 2015-11-19 DIAGNOSIS — Z85828 Personal history of other malignant neoplasm of skin: Secondary | ICD-10-CM | POA: Diagnosis not present

## 2015-11-19 DIAGNOSIS — L821 Other seborrheic keratosis: Secondary | ICD-10-CM | POA: Diagnosis not present

## 2015-11-19 DIAGNOSIS — L720 Epidermal cyst: Secondary | ICD-10-CM | POA: Diagnosis not present

## 2015-11-19 DIAGNOSIS — L814 Other melanin hyperpigmentation: Secondary | ICD-10-CM | POA: Diagnosis not present

## 2015-11-19 DIAGNOSIS — L570 Actinic keratosis: Secondary | ICD-10-CM | POA: Diagnosis not present

## 2015-12-19 ENCOUNTER — Ambulatory Visit (INDEPENDENT_AMBULATORY_CARE_PROVIDER_SITE_OTHER): Payer: Medicare Other | Admitting: *Deleted

## 2015-12-19 DIAGNOSIS — Z23 Encounter for immunization: Secondary | ICD-10-CM

## 2016-01-16 DIAGNOSIS — L570 Actinic keratosis: Secondary | ICD-10-CM | POA: Diagnosis not present

## 2016-01-16 DIAGNOSIS — C44529 Squamous cell carcinoma of skin of other part of trunk: Secondary | ICD-10-CM | POA: Diagnosis not present

## 2016-01-16 DIAGNOSIS — L603 Nail dystrophy: Secondary | ICD-10-CM | POA: Diagnosis not present

## 2016-01-16 DIAGNOSIS — C44521 Squamous cell carcinoma of skin of breast: Secondary | ICD-10-CM | POA: Diagnosis not present

## 2016-01-16 DIAGNOSIS — Z85828 Personal history of other malignant neoplasm of skin: Secondary | ICD-10-CM | POA: Diagnosis not present

## 2016-03-19 DIAGNOSIS — M720 Palmar fascial fibromatosis [Dupuytren]: Secondary | ICD-10-CM | POA: Diagnosis not present

## 2016-03-23 DIAGNOSIS — Q159 Congenital malformation of eye, unspecified: Secondary | ICD-10-CM | POA: Diagnosis not present

## 2016-03-23 DIAGNOSIS — H4053X2 Glaucoma secondary to other eye disorders, bilateral, moderate stage: Secondary | ICD-10-CM | POA: Diagnosis not present

## 2016-04-01 ENCOUNTER — Other Ambulatory Visit: Payer: Self-pay | Admitting: Emergency Medicine

## 2016-04-01 ENCOUNTER — Telehealth: Payer: Self-pay | Admitting: Emergency Medicine

## 2016-04-01 MED ORDER — LORAZEPAM 0.5 MG PO TABS: 0.5000 mg | ORAL_TABLET | Freq: Two times a day (BID) | ORAL | 1 refills | Status: DC | PRN

## 2016-04-01 NOTE — Telephone Encounter (Signed)
Spoke with pt daughter to inform her that a rx was sent off to pharmacy

## 2016-04-02 ENCOUNTER — Other Ambulatory Visit: Payer: Self-pay | Admitting: Emergency Medicine

## 2016-04-06 ENCOUNTER — Ambulatory Visit: Payer: Medicare Other

## 2016-04-07 DIAGNOSIS — H0014 Chalazion left upper eyelid: Secondary | ICD-10-CM | POA: Diagnosis not present

## 2016-04-14 ENCOUNTER — Telehealth: Payer: Self-pay | Admitting: Family Medicine

## 2016-04-14 NOTE — Telephone Encounter (Signed)
Scheduled the pt to come on 04/27/16 @ 3:45.  Todd requested extra time.

## 2016-04-14 NOTE — Telephone Encounter (Signed)
NO DPR on file.  Left a message for the pt to return my call.

## 2016-04-14 NOTE — Telephone Encounter (Signed)
Pts daughter would like to see if Dr. Sherren Mocha would see pt this week opposed to coming in on 04/26/16 at 10:45?  Pt anxiety is worse.

## 2016-04-14 NOTE — Telephone Encounter (Signed)
Pt needs to be notified.

## 2016-04-15 NOTE — Telephone Encounter (Signed)
Spoke to the pt.  Informed her of appointment on 04/27/16 @ 3:45.  She currently does not need a refill of medication.  Taking 1/2 tab of lorazepam and doing well.  Advised she call if she needed more ok.  Received a verbal from Dr. Sherren Mocha to fill until he returns if needed.

## 2016-04-21 ENCOUNTER — Ambulatory Visit: Payer: Medicare Other

## 2016-04-26 ENCOUNTER — Ambulatory Visit: Payer: Medicare Other | Admitting: Family Medicine

## 2016-04-27 ENCOUNTER — Encounter: Payer: Self-pay | Admitting: Family Medicine

## 2016-04-27 ENCOUNTER — Ambulatory Visit (INDEPENDENT_AMBULATORY_CARE_PROVIDER_SITE_OTHER): Payer: Medicare Other | Admitting: Family Medicine

## 2016-04-27 VITALS — BP 140/80 | Temp 98.3°F | Ht 63.0 in | Wt 123.0 lb

## 2016-04-27 DIAGNOSIS — Z85820 Personal history of malignant melanoma of skin: Secondary | ICD-10-CM

## 2016-04-27 DIAGNOSIS — F4321 Adjustment disorder with depressed mood: Secondary | ICD-10-CM

## 2016-04-27 DIAGNOSIS — H43811 Vitreous degeneration, right eye: Secondary | ICD-10-CM | POA: Diagnosis not present

## 2016-04-27 DIAGNOSIS — H5310 Unspecified subjective visual disturbances: Secondary | ICD-10-CM | POA: Diagnosis not present

## 2016-04-27 LAB — POCT URINALYSIS DIPSTICK
Bilirubin, UA: NEGATIVE
Blood, UA: NEGATIVE
Glucose, UA: NEGATIVE
Ketones, UA: NEGATIVE
Leukocytes, UA: NEGATIVE
Nitrite, UA: NEGATIVE
Protein, UA: NEGATIVE
Spec Grav, UA: 1.01 (ref 1.030–1.035)
Urobilinogen, UA: 0.2 (ref ?–2.0)
pH, UA: 6 (ref 5.0–8.0)

## 2016-04-27 LAB — BASIC METABOLIC PANEL
BUN: 16 mg/dL (ref 6–23)
CO2: 30 mEq/L (ref 19–32)
Calcium: 10.4 mg/dL (ref 8.4–10.5)
Chloride: 103 mEq/L (ref 96–112)
Creatinine, Ser: 0.74 mg/dL (ref 0.40–1.20)
GFR: 81.39 mL/min (ref >60.00)
Glucose, Bld: 98 mg/dL (ref 70–99)
Potassium: 4.3 mEq/L (ref 3.5–5.1)
Sodium: 142 mEq/L (ref 135–145)

## 2016-04-27 LAB — HEPATIC FUNCTION PANEL
ALT: 11 U/L (ref 0–35)
AST: 17 U/L (ref 0–37)
Albumin: 4.6 g/dL (ref 3.5–5.2)
Alkaline Phosphatase: 61 U/L (ref 39–117)
Bilirubin, Direct: 0.1 mg/dL (ref 0.0–0.3)
Total Bilirubin: 0.4 mg/dL (ref 0.2–1.2)
Total Protein: 6.8 g/dL (ref 6.0–8.3)

## 2016-04-27 LAB — CBC WITH DIFFERENTIAL/PLATELET
Basophils Absolute: 0 10*3/uL (ref 0.0–0.1)
Basophils Relative: 0.9 % (ref 0.0–3.0)
Eosinophils Absolute: 0.1 10*3/uL (ref 0.0–0.7)
Eosinophils Relative: 1.6 % (ref 0.0–5.0)
HCT: 42.6 % (ref 36.0–46.0)
Hemoglobin: 13.8 g/dL (ref 12.0–15.0)
Lymphocytes Relative: 25.4 % (ref 12.0–46.0)
Lymphs Abs: 1.3 10*3/uL (ref 0.7–4.0)
MCHC: 32.4 g/dL (ref 30.0–36.0)
MCV: 86.4 fl (ref 78.0–100.0)
Monocytes Absolute: 0.4 10*3/uL (ref 0.1–1.0)
Monocytes Relative: 7.9 % (ref 3.0–12.0)
Neutro Abs: 3.4 10*3/uL (ref 1.4–7.7)
Neutrophils Relative %: 64.2 % (ref 43.0–77.0)
Platelets: 252 10*3/uL (ref 150.0–400.0)
RBC: 4.93 Mil/uL (ref 3.87–5.11)
RDW: 13.4 % (ref 11.5–15.5)
WBC: 5.2 10*3/uL (ref 4.0–10.5)

## 2016-04-27 LAB — LIPID PANEL
Cholesterol: 237 mg/dL — ABNORMAL HIGH (ref 0–200)
HDL: 75.2 mg/dL (ref >39.00)
LDL Cholesterol: 148 mg/dL — ABNORMAL HIGH (ref 0–99)
NonHDL: 162.26
Total CHOL/HDL Ratio: 3
Triglycerides: 69 mg/dL (ref 0.0–149.0)
VLDL: 13.8 mg/dL (ref 0.0–40.0)

## 2016-04-27 LAB — TSH: TSH: 1.85 u[IU]/mL (ref 0.35–4.50)

## 2016-04-27 MED ORDER — TRAZODONE HCL 50 MG PO TABS: ORAL_TABLET | ORAL | 4 refills | Status: DC

## 2016-04-27 MED ORDER — LORAZEPAM 0.5 MG PO TABS: ORAL_TABLET | ORAL | 4 refills | Status: DC

## 2016-04-27 NOTE — Progress Notes (Signed)
Carmen [name] is a 76 year old recently widowed female nonsmoker........ husband Carmen [name] drop dead 4 weeks ago from acute MI..... Who comes in today for evaluation depression  She gets regular eye care. She's had surgery at [facility]. She is on drops for glaucoma and a steroid drop.  After husband's death she began taking Ativan 0.5 mg dose one half tab twice a day when necessary.  She's having difficulty sleeping and she is depressed because of the death of her husband. She and her daughter Carmen [name] a going to grief counseling at hospice. She would like to discuss other medications to help with depression and sleep.  She gets routine eye care, dental care, colonoscopy up-to-date, vaccinations all up-to-date  14 point review of systems reviewed and otherwise negative  Pap and pelvic done last year were normal therefore recommended every 3 years. She had her left breast reconstructed. Showed implants in the past because she had both breast removed because of the positive family history of breast cancer. The left implant collapsed after trauma with a dog. She had the left breast reconstructed in 9629 without complications at [facility].  She also has history of skin cancer. She's followed by dermatology on a regular basis.BP 140/80   Temp 98.3 F (36.8 C) (Oral)   Ht 5\' 3"  (1.6 m)   Wt 123 lb (55.8 kg)   BMI 21.79 kg/m   General she is well-developed well-nourished female tearful because of her's house and sudden death. Examination HEENT were negative except except for cerumen impaction left ear which removed by rotation. Neck was supple thyroid not enlarged cardiopulmonary exam normal abdominal exam normal extremities normal skin normal peripheral pulses normal. Pelvic and rectal not indicated therefore deferred.  She does have one lesion on right upper quadrant abdomen looks like an actinic keratosis. She seen a dermatologist in 3 weeks.  #1 depression secondary to the sudden death of her husband 4 weeks  ago,,,,,,,,,,,,,,, continue the low-dose Ativan add trazodone 50 mg daily at bedtime follow-up in 4 weeks. Advised to continue the hospice grief counseling program.  History of eye disease,,,,,,,,,, status post Fuchs dystrophy with bilateral corneal implants and glaucoma,,,,,,,, continue eyedrops follow-up at [facility]  #3 history of skin cancer,,,,,,,, followed by Dr. Martinique  #4 status post bilateral breast implants.......Marland Kitchen prophylactic mastectomy because a family history of breast cancer,,,,,,, recent redo left breast because the grand dog caused the sac to rupture

## 2016-04-27 NOTE — Patient Instructions (Signed)
Continue Ativan 0.5,,,,,,,,,,,, one half tab twice daily as needed  Trazodone 50 mg,,,,,,,,,, 1 tablet at bedtime  Follow-up office visit with me in 4 weeks  Continue the hospice grief counseling program with jill  Walk 30 minutes daily

## 2016-04-27 NOTE — Progress Notes (Signed)
Pre visit review using our clinic review tool, if applicable. No additional management support is needed unless otherwise documented below in the visit note. 

## 2016-05-03 ENCOUNTER — Ambulatory Visit: Payer: Medicare Other | Admitting: Family Medicine

## 2016-05-05 DIAGNOSIS — H401131 Primary open-angle glaucoma, bilateral, mild stage: Secondary | ICD-10-CM | POA: Diagnosis not present

## 2016-05-19 DIAGNOSIS — D225 Melanocytic nevi of trunk: Secondary | ICD-10-CM | POA: Diagnosis not present

## 2016-05-19 DIAGNOSIS — L821 Other seborrheic keratosis: Secondary | ICD-10-CM | POA: Diagnosis not present

## 2016-05-19 DIAGNOSIS — Z85820 Personal history of malignant melanoma of skin: Secondary | ICD-10-CM | POA: Diagnosis not present

## 2016-05-19 DIAGNOSIS — Z85828 Personal history of other malignant neoplasm of skin: Secondary | ICD-10-CM | POA: Diagnosis not present

## 2016-05-19 DIAGNOSIS — L814 Other melanin hyperpigmentation: Secondary | ICD-10-CM | POA: Diagnosis not present

## 2016-05-19 DIAGNOSIS — L820 Inflamed seborrheic keratosis: Secondary | ICD-10-CM | POA: Diagnosis not present

## 2016-06-08 ENCOUNTER — Ambulatory Visit: Payer: Medicare Other | Admitting: Adult Health

## 2016-06-12 DIAGNOSIS — R05 Cough: Secondary | ICD-10-CM | POA: Diagnosis not present

## 2016-06-12 DIAGNOSIS — J0100 Acute maxillary sinusitis, unspecified: Secondary | ICD-10-CM | POA: Diagnosis not present

## 2016-06-16 ENCOUNTER — Telehealth: Payer: Self-pay | Admitting: Family Medicine

## 2016-06-16 NOTE — Telephone Encounter (Signed)
Patient's daughter is calling in and wants to talk to Dr. Sherren Mocha between now and Monday if possible to discuss some concerns she has for her mother before they come in for the appointmentt and her mother is present.

## 2016-06-21 ENCOUNTER — Ambulatory Visit (INDEPENDENT_AMBULATORY_CARE_PROVIDER_SITE_OTHER): Payer: Medicare Other | Admitting: Family Medicine

## 2016-06-21 ENCOUNTER — Encounter: Payer: Self-pay | Admitting: Family Medicine

## 2016-06-21 VITALS — BP 124/76 | Temp 98.4°F | Wt 119.0 lb

## 2016-06-21 DIAGNOSIS — F4321 Adjustment disorder with depressed mood: Secondary | ICD-10-CM | POA: Diagnosis not present

## 2016-06-21 NOTE — Patient Instructions (Signed)
Trazodone 50 mg.........Marland Kitchen 1 tablet at bedtime  Ativan............ one half tablet at 2:00 in the afternoon  First thing in the morning aft year morning breakfast.........Marland Kitchen go for a 45 minute walk

## 2016-06-21 NOTE — Progress Notes (Signed)
Carmen [name] is a 76 year old recently widowed female....... her husband [name] died suddenly 6 weeks ago.... Who comes in today He by her daughter gel for follow-up  We started on trazodone 50 mg daily at bedtime for sleep and Ativan 0.5 twice a day when necessary. It seems to be around 4 in the afternoon she becomes extremely agitated. We discussed various options. We'll try to take the trazodone as we are since she seems to be sleeping okay, and a half of an Ativan around 2 in the afternoon to help her get through the afternoon.  She also has an appointment to see a psychiatrist in June.  She's also continue work with the hospice grief counseling program  BP 124/76 (BP Location: Left Arm)   Temp 98.4 F (36.9 C) (Oral)   Wt 119 lb (54 kg)   BMI 21.08 kg/m  General she is well-developed well-nourished female no acute distress  Depression secondary to the death of her husband......... continue trazodone 50 mg daily at bedtime, Ativan 0.5 at 2 in the afternoon. Follow-up by psychiatrist ASAP

## 2016-08-16 ENCOUNTER — Ambulatory Visit: Payer: Medicare Other | Admitting: Psychology

## 2016-09-21 DIAGNOSIS — Z1231 Encounter for screening mammogram for malignant neoplasm of breast: Secondary | ICD-10-CM | POA: Diagnosis not present

## 2016-09-21 DIAGNOSIS — Z803 Family history of malignant neoplasm of breast: Secondary | ICD-10-CM | POA: Diagnosis not present

## 2016-09-21 LAB — HM MAMMOGRAPHY

## 2016-09-22 DIAGNOSIS — M720 Palmar fascial fibromatosis [Dupuytren]: Secondary | ICD-10-CM | POA: Diagnosis not present

## 2016-09-23 ENCOUNTER — Other Ambulatory Visit: Payer: Self-pay

## 2016-10-19 ENCOUNTER — Encounter: Payer: Self-pay | Admitting: Family Medicine

## 2016-10-19 ENCOUNTER — Ambulatory Visit (INDEPENDENT_AMBULATORY_CARE_PROVIDER_SITE_OTHER): Payer: Medicare Other | Admitting: Family Medicine

## 2016-10-19 VITALS — BP 108/82 | HR 74 | Temp 98.4°F | Wt 115.0 lb

## 2016-10-19 DIAGNOSIS — F4321 Adjustment disorder with depressed mood: Secondary | ICD-10-CM

## 2016-10-19 MED ORDER — TRAZODONE HCL 50 MG PO TABS: ORAL_TABLET | ORAL | 4 refills | Status: DC

## 2016-10-19 NOTE — Patient Instructions (Signed)
Trazodone 50 mg.........Marland Kitchen 1 Monday Wednesday Friday for 3 weeks......... at the end of 3 weeks if you're feeling okay then decrease the dose to 1 on Tuesday and Friday for 3 weeks and then stop it completely. If however during this tapering program he began having symptoms that I would recommend you go back and take it nightly and then when we see you in the spring for your checkup we'll talk about tapering at that juncture.

## 2016-10-19 NOTE — Progress Notes (Signed)
Carmen [name] is a 76 year old recently widowed female........ her husband [name] died last 2022-05-21....... who comes in today for follow-up  She's been going to the hospice grief counseling program on a regular basis. She feels like this is helped a lot. She's not taken the Ativan anymore. She is taking the trazodone 50 mg at bedtime and wonders if she could taper that off.  BP 108/82 (BP Location: Left Arm, Patient Position: Sitting, Cuff Size: Normal)   Pulse 74   Temp 98.4 F (36.9 C) (Oral)   Wt 115 lb (52.2 kg)   BMI 20.37 kg/m  General she is well-developed well-nourished female no acute distress. She is appropriate alert oriented does not feeding depressed or anxious.  #1 depression secondary to the death of her husband.....Marland Kitchen continue hospice counseling and taper prednisone as outlined

## 2016-11-09 DIAGNOSIS — H52203 Unspecified astigmatism, bilateral: Secondary | ICD-10-CM | POA: Diagnosis not present

## 2016-11-09 DIAGNOSIS — H401131 Primary open-angle glaucoma, bilateral, mild stage: Secondary | ICD-10-CM | POA: Diagnosis not present

## 2016-11-09 DIAGNOSIS — Z961 Presence of intraocular lens: Secondary | ICD-10-CM | POA: Diagnosis not present

## 2016-11-09 DIAGNOSIS — H1851 Endothelial corneal dystrophy: Secondary | ICD-10-CM | POA: Diagnosis not present

## 2016-11-22 DIAGNOSIS — L821 Other seborrheic keratosis: Secondary | ICD-10-CM | POA: Diagnosis not present

## 2016-11-22 DIAGNOSIS — Z85828 Personal history of other malignant neoplasm of skin: Secondary | ICD-10-CM | POA: Diagnosis not present

## 2016-11-22 DIAGNOSIS — Z85820 Personal history of malignant melanoma of skin: Secondary | ICD-10-CM | POA: Diagnosis not present

## 2016-11-22 DIAGNOSIS — D1801 Hemangioma of skin and subcutaneous tissue: Secondary | ICD-10-CM | POA: Diagnosis not present

## 2016-12-12 DIAGNOSIS — Z23 Encounter for immunization: Secondary | ICD-10-CM | POA: Diagnosis not present

## 2017-02-09 DIAGNOSIS — H1132 Conjunctival hemorrhage, left eye: Secondary | ICD-10-CM | POA: Diagnosis not present

## 2017-03-15 DIAGNOSIS — H401132 Primary open-angle glaucoma, bilateral, moderate stage: Secondary | ICD-10-CM | POA: Diagnosis not present

## 2017-03-15 DIAGNOSIS — H5340 Unspecified visual field defects: Secondary | ICD-10-CM | POA: Diagnosis not present

## 2017-03-17 ENCOUNTER — Other Ambulatory Visit: Payer: Self-pay | Admitting: Ophthalmology

## 2017-03-17 DIAGNOSIS — H401132 Primary open-angle glaucoma, bilateral, moderate stage: Secondary | ICD-10-CM | POA: Diagnosis not present

## 2017-03-17 DIAGNOSIS — H53462 Homonymous bilateral field defects, left side: Secondary | ICD-10-CM

## 2017-03-17 DIAGNOSIS — H5340 Unspecified visual field defects: Secondary | ICD-10-CM | POA: Diagnosis not present

## 2017-03-18 ENCOUNTER — Ambulatory Visit
Admission: RE | Admit: 2017-03-18 | Discharge: 2017-03-18 | Disposition: A | Discharge status: Home / Self Care | Payer: Medicare Other | Source: Ambulatory Visit | Attending: Ophthalmology | Admitting: Ophthalmology

## 2017-03-18 DIAGNOSIS — H53462 Homonymous bilateral field defects, left side: Secondary | ICD-10-CM

## 2017-03-18 DIAGNOSIS — H53459 Other localized visual field defect, unspecified eye: Secondary | ICD-10-CM | POA: Diagnosis not present

## 2017-03-18 MED ORDER — GADOBENATE DIMEGLUMINE 529 MG/ML IV SOLN
10.0000 mL | Freq: Once | INTRAVENOUS | Status: AC | PRN
  Administered 2017-03-18: 10 mL via INTRAVENOUS

## 2017-03-28 ENCOUNTER — Encounter: Payer: Self-pay | Admitting: Neurology

## 2017-03-28 ENCOUNTER — Ambulatory Visit (INDEPENDENT_AMBULATORY_CARE_PROVIDER_SITE_OTHER): Payer: Medicare Other | Admitting: Neurology

## 2017-03-28 VITALS — BP 144/86 | HR 99 | Ht 63.0 in | Wt 108.0 lb

## 2017-03-28 DIAGNOSIS — R0683 Snoring: Secondary | ICD-10-CM | POA: Diagnosis not present

## 2017-03-28 DIAGNOSIS — I680 Cerebral amyloid angiopathy: Secondary | ICD-10-CM

## 2017-03-28 DIAGNOSIS — G479 Sleep disorder, unspecified: Secondary | ICD-10-CM

## 2017-03-28 DIAGNOSIS — H5340 Unspecified visual field defects: Secondary | ICD-10-CM | POA: Diagnosis not present

## 2017-03-28 DIAGNOSIS — R9089 Other abnormal findings on diagnostic imaging of central nervous system: Secondary | ICD-10-CM | POA: Diagnosis not present

## 2017-03-28 NOTE — Patient Instructions (Addendum)
Thankfully, your neurological exam is benign. Please monitor your weight, you do not need to lose anymore weight. Please talk to your primary care physician about stress management.   Continue exercising regularly and take your medications as directed. As discussed, secondary prevention is key for you. This means: taking care of blood sugar values or diabetes management (A1c goal of less than 7.0), good blood pressure (hypertension with BP below 130/90) control and optimizing cholesterol management (with LDL goal of less than 70), exercising daily or regularly within your own mobility limitations of course (and weather permitting), and overall cardiovascular risk factor reduction, which includes screening for and treatment of obstructive sleep apnea (OSA) and weight management.  As discussed, I would like to order a sleep study to rule out obstructive sleep apnea in your case.  If you have more than mild OSA, I want you to consider treatment with CPAP. Please remember, the risks and ramifications of moderate to severe obstructive sleep apnea or OSA are: Cardiovascular disease, including congestive heart failure, stroke, difficult to control hypertension, arrhythmias, and even type 2 diabetes has been linked to untreated OSA. Sleep apnea causes disruption of sleep and sleep deprivation in most cases, which, in turn, can cause recurrent headaches, problems with memory, mood, concentration, focus, and vigilance. Most people with untreated sleep apnea report excessive daytime sleepiness, which can affect their ability to drive. Please do not drive if you feel sleepy.   I will see you back after your sleep study to go over the test results and where to go from there. We will call you after your sleep study to advise about the results (most likely, you will hear from Isanti, my nurse) and to set up an appointment at the time, as necessary.    Our sleep lab administrative assistant will call you to schedule your  sleep study. If you don't hear back from her by about 2 weeks from now, please feel free to call her at [phone]. You can leave a message with your phone number and concerns, if you get the voicemail box. She will call back as soon as possible.

## 2017-03-28 NOTE — Progress Notes (Signed)
Subjective:    Patient ID: Carmen [name] is a 77 y.o. female.  HPI     Carmen Age, MD, PhD [facility] [address], [address] Carmen [name], [name] [address]  Dear Dr. Ellie [name],   I saw your patient, Carmen [name], upon your kind request in my neurologic clinic today for initial consultation of her abnormal brain MRI, concern for underlying cerebral amyloid angiopathy. The patient is accompanied by her son and her daughter today. 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 She had a brain MRI with and without contrast on 03/18/2017 and I reviewed the results through the PACS system but also in report: Impression: No acute intracranial abnormality. Innumerable peripheral predominant chronic microhemorrhages throughout the brain, consistent with severe cerebral amyloid angiopathy. Confluent white matter disease is probably a sequela of this disease and a component of chronic small vessel ischemia. I reviewed your office records from 03/17/2017, which you kindly included. She reports that she went for her yearly routine eye exam at the time. She reports no actual visual symptoms at the time. She has no history of daytime somnolence but she has been told that she snores, particularly when she shares a bedroom on a trip with friends, she has been told that she snores significantly. Epworth sleepiness score is 0 out of 24, fatigue score is 9 out of 63. She is widowed and lives alone, she has 3 children, nonsmoker, drinks alcohol occasionally, drinks caffeine in limitation. She does admit that she does not  sleep very well. Her son is concerned that she does not sleep very much. She tries to exercise in the form of walking. She is a retired [employer] scientist. She currently wakes up around 5 or 5:30, sometimes she goes back to sleep. She has had occasional mild her morning headaches recently. She has nocturia about once per average night. She has another daughter in [place] and a total of 11 grandchildren, she has good support through her family. Of note, she did have a tonsillectomy as a child. She has lost a little over 10 pounds over the past year. She does admit that she was not eating right after her husband (of over 84 years) died last year.  Her Past Medical History Is Significant For: Past Medical History:  Diagnosis Date  . Bleb    X 4  . CERUMEN IMPACTION 05/27/2008  . DERMATITIS DUE TO SOLAR RADIATION 05/03/2007  . Dyshidrosis 05/03/2007  . FOOT PAIN, LEFT 08/16/2007  . GLAUCOMA NOS 09/23/2009  . Heart murmur   . Inflamed seborrheic keratosis 12/26/2009  . OSTEOPENIA 05/03/2007  . Tinnitus     Her Past Surgical History Is Significant For: Past Surgical History:  Procedure Laterality Date  . BREAST SURGERY     mastectomy  . BREAST SURGERY     implants    Her Family History Is Significant For: Family History  Problem Relation [name] of Onset  . Cancer Other        breast  . Stroke Other        stroke  . Heart disease Other     Her Social History Is Significant For:  Social History   Socioeconomic History  . Marital status: Married    Spouse name: None  . Number of children: None  . Years of education: None  . Highest education level: None  Social Needs  . Financial resource strain: None  . Food insecurity - worry: None  . Food insecurity - inability: None  . Transportation needs - medical: None  . Transportation needs - non-medical: None  Occupational History  . None  Tobacco Use  . Smoking status: Never Smoker  . Smokeless tobacco: Never Used  Substance and Sexual Activity  .  Alcohol use: None  . Drug use: None  . Sexual activity: None  Other Topics Concern  . None  Social History Narrative  . None    Her Allergies Are:  Allergies  Allergen Reactions  . Sulfonamide Derivatives Other (See Comments)    Doesn't remember  :   Her Current Medications Are:  Outpatient Encounter Medications as of 03/28/2017  Medication Sig  . Calcium Carbonate-Vit D-Min (CALCIUM 1200 PO) Take 2,400 mg by mouth daily.  . Carboxymethylcellul-Glycerin (REFRESH OPTIVE OP) Apply to eye.  Marland Kitchen LUMIGAN 0.01 % SOLN Place 1 drop into both eyes daily.   . Omega-3 Fatty Acids (OMEGA 3 PO) Take 2 capsules by mouth daily.  . prednisoLONE acetate (PRED FORTE) 1 % ophthalmic suspension 1 drop 4 (four) times daily.  . [DISCONTINUED] LORazepam (ATIVAN) 0.5 MG tablet One half tab twice a day when necessary (Patient not taking: Reported on 10/19/2016)  . [DISCONTINUED] traZODone (DESYREL) 50 MG tablet 1 tab daily at bedtime   No facility-administered encounter medications on file as of 03/28/2017.   :  Review of Systems:  Out of a complete 14 point review of systems, all are reviewed and negative with the exception of these symptoms as listed below: Review of Systems  Neurological:       Pt presents today to discuss her MRI. Her eye doctor noticed 2 abnormal visual field tests. Pt has not noticed any vision problems. Pt denies memory problems unless she is under significant stress. Pt has an upcoming appt with her PCP, Dr. Sherren Mocha. Pt denies any significant sleep problems.  Epworth Sleepiness Scale 0= would never doze 1= slight chance of dozing 2= moderate chance of dozing 3= high chance of dozing  Sitting and reading: 0 Watching TV: 0 Sitting inactive in a public place (ex. Theater or meeting): 0 As a passenger in a car for an hour without a break: 0 Lying down to rest in the afternoon: 0 Sitting and talking to someone: 0 Sitting quietly after [name] (no alcohol): 0 In a car, while stopped  in traffic: 0 Total: 0     Objective:  Neurological Exam  Physical Exam Physical Examination:   Vitals:   03/28/17 1019  BP: (!) 144/86  Pulse: 99    General Examination: The patient is a very pleasant 77 y.o. female in no acute distress. She appears thin appearing, mildly anxious, well groomed.   HEENT: Normocephalic, atraumatic, pupils are equal, round and reactive to light and accommodation. Extraocular tracking is good without limitation to gaze excursion or nystagmus noted. Normal smooth pursuit is noted. Hearing is grossly intact. Face is symmetric with normal facial animation and normal facial sensation. Speech is clear with no dysarthria noted. There is no hypophonia. There is no lip, neck/head, jaw or voice tremor. Neck is supple with full range of passive and active motion. There are no carotid bruits on auscultation. Oropharynx exam  reveals: mild mouth dryness, adequate dental hygiene and mild airway crowding, due to smaller airway entry and redundant soft palate, floppy appearing uvula, tonsils are absent. She has a slender neck circumference.   Chest: Clear to auscultation without wheezing, rhonchi or crackles noted.  Heart: S1+S2+0, regular and normal without murmurs, rubs or gallops noted.   Abdomen: Soft, non-tender and non-distended with normal bowel sounds appreciated on auscultation.  Extremities: There is no pitting edema in the distal lower extremities bilaterally. Pedal pulses are intact.  Skin: Warm and dry without trophic changes noted.  Musculoskeletal: exam reveals no obvious joint deformities, tenderness or joint swelling or erythema.   Neurologically:  Mental status: The patient is awake, alert and oriented in all 4 spheres. Her immediate and remote memory, attention, language skills and fund of knowledge are appropriate. There is no evidence of aphasia, agnosia, apraxia or anomia. Speech is clear with normal prosody and enunciation. Thought process is  linear. Mood is normal and affect is normal.  Cranial nerves II - XII are as described above under HEENT exam. In addition: shoulder shrug is normal with equal shoulder height noted. Motor exam: Normal bulk, strength and tone is noted. There is no drift, tremor or rebound. Romberg is negative. Reflexes are 2+ throughout. Fine motor skills and coordination: intact with normal finger taps, normal hand movements, normal rapid alternating patting, normal foot taps and normal foot agility.  Cerebellar testing: No dysmetria or intention tremor on finger to nose testing. Heel to shin is unremarkable bilaterally. There is no truncal or gait ataxia.  Sensory exam: intact to light touch in the upper and lower extremities.  Gait, station and balance: She stands easily. No veering to one side is noted. No leaning to one side is noted. Posture is [name]-appropriate and stance is narrow based. Gait shows normal stride length and normal pace. No problem turning, tandem walk is challenging for her.   Assessment and Plan:   In summary, Carmen [name] is a very pleasant 77 y.o.-year old female with an underlying medical history of osteopenia, tinnitus, glaucoma, Fuchs corneal dystrophy, sleep disturbance in the recent past secondary to loss of her husband in March 2018, who presents for neurologic consultation of a recent abnormal brain MRI in keeping with cerebral amyloid angiopathy microhemorrhages. Generalized white matter changes were noted as well.  Thankfully, her neurological exam is largely nonfocal. She does not have any cognitive complaints. I reassured the patient in that regard.  I had a long chat with the patient and her children about my findings and the diagnosis of cerebral amyloid angiopathy and microhemorrhages, vascular risk factors, and prognosis and treatment options. Unfortunately, there is no specific treatment available for cerebral amyloid angiopathy. We talked about secondary prevention quite a bit  today, including taking care of blood sugar values or diabetes management (A1c goal of less than 7.0), good blood pressure (hypertension with BP below 130/90) control and optimizing cholesterol management (with LDL goal of less than 70), exercising daily or regularly and screening for and treatment of obstructive sleep apnea (OSA) and weight management. He does not need to lose any weight. She has lost about 10 pounds since her husband died. She is encouraged to continue to take good care of herself, talking about stress reduction and management mood related issues. She has an upcoming physical appointment, at which time she will likely have necessary blood work as she is supposed to go and fasting. I did not do any blood work today for  that reason. From my end of things I suggested pursuing a sleep study as she does snore and sleep apnea should be excluded as an independent risk factor for vascular disease. She would be hesitant but willing to try CPAP therapy the need arises, of note, her son has sleep apnea and uses a CPAP successfully. We may pursue a repeat brain MRI down the [place]. She is encouraged to follow-up with you for ongoing tracking of her visual field and routine eye exam as well.  Thank you very much for allowing me to participate in the care of this nice patient. If I can be of any further assistance to you please do not hesitate to call me at [phone].  Sincerely,   Carmen Age, MD, PhD

## 2017-04-11 ENCOUNTER — Ambulatory Visit (INDEPENDENT_AMBULATORY_CARE_PROVIDER_SITE_OTHER): Payer: Medicare Other | Admitting: Family Medicine

## 2017-04-11 ENCOUNTER — Encounter: Payer: Self-pay | Admitting: Family Medicine

## 2017-04-11 VITALS — BP 118/78 | HR 84 | Temp 97.9°F | Ht 63.0 in | Wt 110.0 lb

## 2017-04-11 DIAGNOSIS — F0390 Unspecified dementia without behavioral disturbance: Secondary | ICD-10-CM

## 2017-04-11 DIAGNOSIS — E785 Hyperlipidemia, unspecified: Secondary | ICD-10-CM | POA: Diagnosis not present

## 2017-04-11 DIAGNOSIS — F4321 Adjustment disorder with depressed mood: Secondary | ICD-10-CM | POA: Diagnosis not present

## 2017-04-11 DIAGNOSIS — Z85820 Personal history of malignant melanoma of skin: Secondary | ICD-10-CM

## 2017-04-11 LAB — POCT URINALYSIS DIPSTICK
Bilirubin, UA: NEGATIVE
Blood, UA: NEGATIVE
Glucose, UA: NEGATIVE
Ketones, UA: NEGATIVE
Leukocytes, UA: NEGATIVE
Nitrite, UA: NEGATIVE
Odor: NEGATIVE
Protein, UA: NEGATIVE
Spec Grav, UA: >=1.03 — AB (ref 1.010–1.025)
Urobilinogen, UA: 0.2 E.U./dL
pH, UA: 6 (ref 5.0–8.0)

## 2017-04-11 LAB — LIPID PANEL
Cholesterol: 235 mg/dL — ABNORMAL HIGH (ref 0–200)
HDL: 80.9 mg/dL (ref >39.00)
LDL Cholesterol: 139 mg/dL — ABNORMAL HIGH (ref 0–99)
NonHDL: 153.85
Total CHOL/HDL Ratio: 3
Triglycerides: 72 mg/dL (ref 0.0–149.0)
VLDL: 14.4 mg/dL (ref 0.0–40.0)

## 2017-04-11 LAB — BASIC METABOLIC PANEL
BUN: 19 mg/dL (ref 6–23)
CO2: 30 mEq/L (ref 19–32)
Calcium: 9.7 mg/dL (ref 8.4–10.5)
Chloride: 104 mEq/L (ref 96–112)
Creatinine, Ser: 0.73 mg/dL (ref 0.40–1.20)
GFR: 82.46 mL/min (ref >60.00)
Glucose, Bld: 93 mg/dL (ref 70–99)
Potassium: 3.8 mEq/L (ref 3.5–5.1)
Sodium: 143 mEq/L (ref 135–145)

## 2017-04-11 LAB — CBC WITH DIFFERENTIAL/PLATELET
Basophils Absolute: 0 10*3/uL (ref 0.0–0.1)
Basophils Relative: 0.7 % (ref 0.0–3.0)
Eosinophils Absolute: 0 10*3/uL (ref 0.0–0.7)
Eosinophils Relative: 0.9 % (ref 0.0–5.0)
HCT: 41.9 % (ref 36.0–46.0)
Hemoglobin: 13.9 g/dL (ref 12.0–15.0)
Lymphocytes Relative: 20.2 % (ref 12.0–46.0)
Lymphs Abs: 1 10*3/uL (ref 0.7–4.0)
MCHC: 33.3 g/dL (ref 30.0–36.0)
MCV: 86.9 fl (ref 78.0–100.0)
Monocytes Absolute: 0.4 10*3/uL (ref 0.1–1.0)
Monocytes Relative: 7.8 % (ref 3.0–12.0)
Neutro Abs: 3.6 10*3/uL (ref 1.4–7.7)
Neutrophils Relative %: 70.4 % (ref 43.0–77.0)
Platelets: 237 10*3/uL (ref 150.0–400.0)
RBC: 4.83 Mil/uL (ref 3.87–5.11)
RDW: 13.6 % (ref 11.5–15.5)
WBC: 5.1 10*3/uL (ref 4.0–10.5)

## 2017-04-11 LAB — HEPATIC FUNCTION PANEL
ALT: 16 U/L (ref 0–35)
AST: 19 U/L (ref 0–37)
Albumin: 4.1 g/dL (ref 3.5–5.2)
Alkaline Phosphatase: 67 U/L (ref 39–117)
Bilirubin, Direct: 0.1 mg/dL (ref 0.0–0.3)
Total Bilirubin: 0.5 mg/dL (ref 0.2–1.2)
Total Protein: 6.8 g/dL (ref 6.0–8.3)

## 2017-04-11 LAB — VITAMIN D 25 HYDROXY (VIT D DEFICIENCY, FRACTURES): VITD: 29.16 ng/mL — ABNORMAL LOW (ref 30.00–100.00)

## 2017-04-11 LAB — VITAMIN B12: Vitamin B-12: 218 pg/mL (ref 211–911)

## 2017-04-11 LAB — TSH: TSH: 1.77 u[IU]/mL (ref 0.35–4.50)

## 2017-04-11 MED ORDER — LORAZEPAM 0.5 MG PO TABS: ORAL_TABLET | ORAL | 1 refills | Status: DC

## 2017-04-11 MED ORDER — PRAVASTATIN SODIUM 10 MG PO TABS: 10.0000 mg | ORAL_TABLET | Freq: Every day | ORAL | 4 refills | Status: DC

## 2017-04-11 NOTE — Progress Notes (Signed)
Carmen [name] is a 77 year old widowed female....... her husband [name] died 2 years ago... Who comes in today accompanied by her daughter [name] for evaluation of depression, dementia  She recently took her mother to see the folks at [facility] neurologic because she's noticed changes intermittent memory and mental status. Neurologic workup shows some changes on her MRI etiology unknown but they don't think it's Alzheimer's type changes. She's due to have a sleep apnea study and they want extensive lab work to see if there is anything correctable.  She gets routine eye care by Dr. Ellie Lunch......... had corneal transplants 6 years ago at [facility]... Regular dental care..... And you mammography.  Colonoscopy 10 years ago was normal. No family history of colon cancer polyps...Marland KitchenMarland Kitchen. don't recommend any screening colonoscopies in the future  She started her uterus and ovaries intact. No history of any GYN problems or Pap smears been normal....... I think we can hold off on those 2.  Vaccinations......... all up-to-date  Her daughter [name] brings in a list of concerns......... one is her anxiety level. [name] feels like her anxiety level is worse. At one point we gave her low-dose Ativan that helped. We talked about a trial of an antidepressant versus a low-dose Ativan. We will try the low-dose Ativan point 5 in the morning to begin with...... give that a 4-6 week trial and then see her for follow-up.  Carmen [name] is still labored to maintain her ADLs and drive.  She states her hearing is normal but I question that.  Cognitive functions..... Not normal. She does not recall the date she got married. Details of her neurologic exam were reviewed  Home health safety reviewed no issues identified, no guns in the house, she does have a healthcare power of attorney and living well  She exercises daily  BP 118/78 (BP Location: Left Arm, Patient Position: Sitting, Cuff Size: Normal)   Pulse 84   Temp 97.9 F (36.6 C) (Oral)   Ht 5'  3" (1.6 m)   Wt 110 lb (49.9 kg)   BMI 19.49 kg/m  Well-developed well-nourished female no acute distress vital signs stable she's afebrile HEENT were pertinent she has evidence of bilateral corneal transplants. Neck was supple thyroid not enlarged no carotid bruits. Cardiopulmonary exam normal breast exam is normal......... status post implants many years ago.... Abdominal exam normal extremities normal skin normal peripheral pulses normal except for multiple scars were she said skin lesions removed including melanomas on her back. But I can appreciate no abnormality.  #1 anxiety........ trial of low-dose Ativan point 5 in the morning  #2 declining mental status etiology unknown......Marland Kitchen pending workup with [facility]  #3 history of bilateral corneal transplants........ follow-up by Dr. Ellie Lunch

## 2017-04-11 NOTE — Patient Instructions (Signed)
Labs today. I will call you if his anything abnormal  Continue daily exercise  Pravachol 10 mg....... and a baby aspirin at bedtime  Return in 4 weeks for follow-up  Ativan 0.5 .......... one every morning  Schedule an audiogram ........ check with [facility].........Marland Kitchen

## 2017-04-12 ENCOUNTER — Telehealth: Payer: Self-pay | Admitting: Family Medicine

## 2017-04-12 NOTE — Telephone Encounter (Signed)
Pt's daughter would like to be called back as soon as possible, see note below

## 2017-04-12 NOTE — Telephone Encounter (Signed)
Copied from [place] [id number]. Topic: General - Other >> Apr 12, 2017  9:46 AM Darl Householder, RMA wrote: Reason for CRM: patient's daughter Dennie Maizes is requesting a call back concerning diagnosis on office visit yesterday 04/11/17 with Dr. Sherren Mocha

## 2017-04-12 NOTE — Telephone Encounter (Signed)
Please Advise

## 2017-04-13 ENCOUNTER — Telehealth: Payer: Self-pay | Admitting: Family Medicine

## 2017-04-13 NOTE — Telephone Encounter (Signed)
Spoke with pt stated that the daughter spoke with our office regarding her concerns with her mother's  Diagnosis from her previous visit. Pt stated there was nothing  further needed.

## 2017-04-13 NOTE — Telephone Encounter (Signed)
Copied from [place]. Topic: General - Other >> Apr 13, 2017  3:13 PM Yvette Rack wrote: Reason for CRM: pt daughter [name] is calling for lab results please call at [phone]

## 2017-04-14 ENCOUNTER — Telehealth: Payer: Self-pay | Admitting: Family Medicine

## 2017-04-14 NOTE — Telephone Encounter (Signed)
Spoke with pt daughter, voiced understanding of her mothers lab results.Explained to the daughter that there was no Diagnosis of dementia on pt chart, Explained to pt daughter that she can discuss that with Dr Sherren Mocha at the [employer] Visit scheduled on 04/19/2017 at 9.30 am.

## 2017-04-14 NOTE — Telephone Encounter (Signed)
Patient's daughter [name]' calling for lab results 04/11/17 not released to [facility]. Also has questions re: dx of dementia.  States has called since Tuesday and would like a call back today please.  [phone]

## 2017-04-19 ENCOUNTER — Encounter: Payer: Self-pay | Admitting: Family Medicine

## 2017-04-19 ENCOUNTER — Ambulatory Visit (INDEPENDENT_AMBULATORY_CARE_PROVIDER_SITE_OTHER): Payer: Medicare Other | Admitting: Family Medicine

## 2017-04-19 VITALS — BP 126/78 | HR 70 | Temp 97.8°F | Wt 111.5 lb

## 2017-04-19 DIAGNOSIS — D510 Vitamin B12 deficiency anemia due to intrinsic factor deficiency: Secondary | ICD-10-CM | POA: Insufficient documentation

## 2017-04-19 MED ORDER — CYANOCOBALAMIN 1000 MCG/ML IJ SOLN: INTRAMUSCULAR | 1 refills | Status: DC

## 2017-04-19 MED ORDER — "NEEDLE (DISP) 27G X 1/2"" MISC": 1.0000 mL | 1 refills | Status: DC

## 2017-04-19 NOTE — Progress Notes (Signed)
Carmen [name] is a 77 year old widowed female nonsmoker comes in today accompanied by her daughter [name] for evaluation of an abnormal B12 level. Her vitamin B12 was markedly low at 218...........Marland KitchenMarland KitchenThe cholesterol is elevated. She should follow a low fat, low cholesterol diet. Repeat test in 6-12 months. Normal is 211.However since the average B12 level and adult is in the 4-500 range I think this is significant. I'm not sure how it relates to her mental status. She certainly not anemic.  BP 126/78 (BP Location: Right Arm, Patient Position: Sitting, Cuff Size: Normal)   Pulse 70   Temp 97.8 F (36.6 C) (Oral)   Wt 111 lb 8 oz (50.6 kg)   BMI 19.75 kg/m  Vital signs stable she's afebrile  #1 vitamin B12 deficiency........Marland Kitchen Long explanation of the causes and treatment options......... Gave her 1 mL of B12 today......Marland Kitchen 1 mL weekly for 4 weeks then 1 mL monthly forever

## 2017-04-19 NOTE — Patient Instructions (Signed)
1 mL weekly 4 weeks then 1 mL monthly forever

## 2017-05-09 ENCOUNTER — Ambulatory Visit: Payer: Self-pay | Admitting: Family Medicine

## 2017-05-17 ENCOUNTER — Ambulatory Visit (INDEPENDENT_AMBULATORY_CARE_PROVIDER_SITE_OTHER): Payer: Medicare Other | Admitting: Family Medicine

## 2017-05-17 ENCOUNTER — Encounter: Payer: Self-pay | Admitting: Family Medicine

## 2017-05-17 VITALS — BP 120/92 | HR 76 | Temp 97.9°F | Wt 111.5 lb

## 2017-05-17 DIAGNOSIS — F4321 Adjustment disorder with depressed mood: Secondary | ICD-10-CM

## 2017-05-17 DIAGNOSIS — E785 Hyperlipidemia, unspecified: Secondary | ICD-10-CM | POA: Diagnosis not present

## 2017-05-17 LAB — LIPID PANEL
Cholesterol: 182 mg/dL (ref 0–200)
HDL: 69.4 mg/dL (ref >39.00)
LDL Cholesterol: 101 mg/dL — ABNORMAL HIGH (ref 0–99)
NonHDL: 112.77
Total CHOL/HDL Ratio: 3
Triglycerides: 58 mg/dL (ref 0.0–149.0)
VLDL: 11.6 mg/dL (ref 0.0–40.0)

## 2017-05-17 LAB — HEPATIC FUNCTION PANEL
ALT: 18 U/L (ref 0–35)
AST: 20 U/L (ref 0–37)
Albumin: 4.3 g/dL (ref 3.5–5.2)
Alkaline Phosphatase: 62 U/L (ref 39–117)
Bilirubin, Direct: 0.1 mg/dL (ref 0.0–0.3)
Total Bilirubin: 0.4 mg/dL (ref 0.2–1.2)
Total Protein: 6.7 g/dL (ref 6.0–8.3)

## 2017-05-17 NOTE — Patient Instructions (Signed)
Continue current medications  Labs today........ I will call you the report and discuss follow-up

## 2017-05-17 NOTE — Progress Notes (Signed)
Back is a delightful 77 year old widowed female who comes in today for evaluation of depressionand hyperlipidemia  She's currently on Ativan 0.5 mg in the morning. This is helped tremendously.  She's been on Pravachol and aspirin daily because a history of hyperlipidemia for about a month. The Pravachol was added because of the changes the neurologist saw on her brain scan. Although they were minimal they recommended that she be on a statin. She's had no side effects from statin  Exercises daily by walking  Having sleep study done tomorrow via neurology  BP (!) 120/92 (BP Location: Right Arm, Patient Position: Sitting, Cuff Size: Normal)   Pulse 76   Temp 97.9 F (36.6 C) (Oral)   Wt 111 lb 8 oz (50.6 kg)   SpO2 96%   BMI 19.75 kg/m  Well-developed well-nourished female no acute distress vital signs stable she's afebrile  Impression  #1 anxiety,,,,,,,,, secondary to grief reaction from husbands death,,,,,,,,, continue Ativan 0.5 every morning  #2 hyperlipidemia,,,,,,,,,, continue statin and aspirin,,,,,,, check labs

## 2017-05-18 ENCOUNTER — Ambulatory Visit (INDEPENDENT_AMBULATORY_CARE_PROVIDER_SITE_OTHER): Payer: Medicare Other | Admitting: Neurology

## 2017-05-18 DIAGNOSIS — I680 Cerebral amyloid angiopathy: Secondary | ICD-10-CM

## 2017-05-18 DIAGNOSIS — G4720 Circadian rhythm sleep disorder, unspecified type: Secondary | ICD-10-CM

## 2017-05-18 DIAGNOSIS — G479 Sleep disorder, unspecified: Secondary | ICD-10-CM

## 2017-05-18 DIAGNOSIS — G4733 Obstructive sleep apnea (adult) (pediatric): Secondary | ICD-10-CM | POA: Diagnosis not present

## 2017-05-18 DIAGNOSIS — R9089 Other abnormal findings on diagnostic imaging of central nervous system: Secondary | ICD-10-CM

## 2017-05-18 DIAGNOSIS — R0683 Snoring: Secondary | ICD-10-CM

## 2017-05-18 DIAGNOSIS — H5340 Unspecified visual field defects: Secondary | ICD-10-CM

## 2017-05-25 DIAGNOSIS — D692 Other nonthrombocytopenic purpura: Secondary | ICD-10-CM | POA: Diagnosis not present

## 2017-05-25 DIAGNOSIS — Z85828 Personal history of other malignant neoplasm of skin: Secondary | ICD-10-CM | POA: Diagnosis not present

## 2017-05-25 DIAGNOSIS — L821 Other seborrheic keratosis: Secondary | ICD-10-CM | POA: Diagnosis not present

## 2017-05-25 DIAGNOSIS — Z85820 Personal history of malignant melanoma of skin: Secondary | ICD-10-CM | POA: Diagnosis not present

## 2017-05-25 DIAGNOSIS — D225 Melanocytic nevi of trunk: Secondary | ICD-10-CM | POA: Diagnosis not present

## 2017-05-30 ENCOUNTER — Telehealth: Payer: Self-pay | Admitting: Neurology

## 2017-05-30 NOTE — Telephone Encounter (Signed)
-----   Message from Star Age, MD sent at 05/30/2017  2:59 PM EDT ----- Patient referred by Dr. Ellie Lunch, seen by me on 03/28/17, diagnostic PSG on 05/18/17.   Please call and notify the patient that the recent sleep study showed moderate to severe obstructive sleep apnea, with a total AHI of 24.7/hour, REM AHI of 65/hour, supine AHI of 25.2/hour and O2 nadir of 81%. The near-absence of REM sleep may underestimate her overall and/or REM-related OSA. I recommend treatment in the form of CPAP. This will require a repeat sleep study for proper titration and mask fitting and correct monitoring of the oxygen saturations. Please explain to patient. I have placed an order in the chart. Thanks.  Star Age, MD, PhD [facility])

## 2017-05-30 NOTE — Telephone Encounter (Signed)
Called patient to discuss sleep study results. No answer at this time. LVM for the patient to call back.   

## 2017-05-30 NOTE — Addendum Note (Signed)
Addended by: Star Age on: 05/30/2017 02:59 PM   Modules accepted: Orders

## 2017-05-30 NOTE — Progress Notes (Signed)
Patient referred by Dr. Ellie Lunch, seen by me on 03/28/17, diagnostic PSG on 05/18/17.   Please call and notify the patient that the recent sleep study showed moderate to severe obstructive sleep apnea, with a total AHI of 24.7/hour, REM AHI of 65/hour, supine AHI of 25.2/hour and O2 nadir of 81%. The near-absence of REM sleep may underestimate her overall and/or REM-related OSA. I recommend treatment in the form of CPAP. This will require a repeat sleep study for proper titration and mask fitting and correct monitoring of the oxygen saturations. Please explain to patient. I have placed an order in the chart. Thanks.  Star Age, MD, PhD [facility])

## 2017-05-30 NOTE — Procedures (Signed)
PATIENT'S NAME:  Carmen [name], Carmen [name] DOB:      1940/10/29      MR#:    [id number]     DATE OF RECORDING: 05/18/2017 REFERRING M.D.: Dr. Ellie Lunch,            PCP: Stevie Kern, MD Study Performed:   Baseline Polysomnogram HISTORY: 77 year old woman with a history of osteopenia, tinnitus, glaucoma, Fuchs corneal dystrophy, sleep disturbance and cerebral amyloid angiopathy, who reports snoring and sleep disruption. The patient's weight 108 pounds with a height of 63 (inches), resulting in a BMI of 19.1 kg/m2. The patient's neck circumference measured 14.5 inches.  CURRENT MEDICATIONS: Calcium Carbonate, Carboxymethylcellul-Glycerin, Lumigan, Omega-3 and Prednisolone.   PROCEDURE:  This is a multichannel digital polysomnogram utilizing the Somnostar 11.2 system.  Electrodes and sensors were applied and monitored per AASM Specifications.   EEG, EOG, Chin and Limb EMG, were sampled at 200 Hz.  ECG, Snore and Nasal Pressure, Thermal Airflow, Respiratory Effort, CPAP Flow and Pressure, Oximetry was sampled at 50 Hz. Digital video and audio were recorded.      BASELINE STUDY  Lights Out was at 21:32 and Lights On at 05:17.  Total recording time (TRT) was 465.5 minutes, with a total sleep time (TST) of  328 minutes.   The patient's sleep latency was 40.5 minutes, which is delayed. REM latency was 438.5 minutes, which is markedly delayed. The sleep efficiency was 70.5%, which is reduced.     SLEEP ARCHITECTURE: WASO (Wake after sleep onset) was 122.5 minutes with severe sleep fragmentation noted.  There were 32.5 minutes in Stage N1, 283.5 minutes Stage N2, 0 minutes Stage N3 and 12 minutes in Stage REM.  The percentage of Stage N1 was 9.9%, which is increased, Stage N2 was 86.4%, which is markedly increased, Stage N3 was absent, and Stage R (REM sleep) was 3.7%, which is markedly reduced. The arousals were noted as: 38 were spontaneous, 0 were associated with PLMs, 133 were associated with respiratory  events.  Audio and video analysis did not show any abnormal or unusual movements, behaviors, phonations or vocalizations. The patient took 1 bathroom break. Mild to moderate snoring was noted. The EKG was in keeping with normal sinus rhythm (NSR).  RESPIRATORY ANALYSIS:  There were a total of 135 respiratory events:  52 obstructive apneas, 0 central apneas and 0 mixed apneas with a total of 52 apneas and an apnea index (AI) of 9.5 /hour. There were 83 hypopneas with a hypopnea index of 15.2 /hour. The patient also had 0 respiratory event related arousals (RERAs).      The total APNEA/HYPOPNEA INDEX (AHI) was 24.7/hour and the total RESPIRATORY DISTURBANCE INDEX was 24.7 /hour.  13 events occurred in REM sleep and 166 events in NREM. The REM AHI was 65 /hour, versus a non-REM AHI of 23.2. The patient spent 321 minutes of total sleep time in the supine position and 7 minutes in non-supine.. The supine AHI was 25.2 versus a non-supine AHI of 0.0.  OXYGEN SATURATION & C02:  The Wake baseline 02 saturation was 98%, with the lowest being 81%. Time spent below 89% saturation equaled 7 minutes.  PERIODIC LIMB MOVEMENTS: The patient had a total of 0 Periodic Limb Movements.  The Periodic Limb Movement (PLM) index was 0 and the PLM Arousal index was 0/hour.  Post-study, the patient indicated that sleep was the same as usual.   IMPRESSION:  1. Obstructive Sleep Apnea (OSA) 2. Dysfunctions associated with sleep stages or arousal from sleep  RECOMMENDATIONS:  1. This study demonstrates moderate to severe obstructive sleep apnea, with a total AHI of 24.7/hour, REM AHI of 65/hour, supine AHI of 25.2/hour and O2 nadir of 81%. The near-absence of REM sleep may underestimate her overall and/or REM-related OSA. Treatment with positive airway pressure in the form of CPAP is recommended. This will require a full night titration study to optimize therapy. Other treatment options may include avoidance of supine sleep  position along with weight loss, upper airway or jaw surgery in selected patients or the use of an oral appliance in certain patients. ENT evaluation and/or consultation with a maxillofacial surgeon or dentist may be feasible in some instances.    2. Please note that untreated obstructive sleep apnea carries additional perioperative morbidity. Patients with significant obstructive sleep apnea should receive perioperative PAP therapy and the surgeons and particularly the anesthesiologist should be informed of the diagnosis and the severity of the sleep disordered breathing. 3. This study shows sleep fragmentation and abnormal sleep stage percentages; these are nonspecific findings and per se do not signify an intrinsic sleep disorder or a cause for the patient's sleep-related symptoms. Causes include (but are not limited to) the first night effect of the sleep study, circadian rhythm disturbances, medication effect or an underlying mood disorder or medical problem.  4. The patient should be cautioned not to drive, work at heights, or operate dangerous or heavy equipment when tired or sleepy. Review and reiteration of good sleep hygiene measures should be pursued with any patient. 5. The patient will be seen in follow-up by Dr. Rexene Alberts at [facility] for discussion of the test results and further management strategies. The referring provider will be notified of the test results.  I certify that I have reviewed the entire raw data recording prior to the issuance of this report in accordance with the Standards of Accreditation of the American Academy of Sleep Medicine (AASM)  Star Age, MD, PhD Diplomat, American Board of Psychiatry and Neurology (Neurology and Sleep Medicine)

## 2017-05-31 ENCOUNTER — Encounter: Payer: Self-pay | Admitting: Gastroenterology

## 2017-05-31 NOTE — Telephone Encounter (Signed)
I called pt again to discuss her sleep study. No answer, left a message asking her to call me back.

## 2017-06-01 NOTE — Telephone Encounter (Signed)
Pt called office, asked to speak with me. I took call. I spent an extended amount of time on the phone with her. Pt is "shocked" that she has osa. She reports that she was given the impression by the sleep tech that she did not have sleep apnea and would not need to return. She does not believe that she has sleep apnea with the severity that was noted on the study. Pt is insistent that the technician gave her the impression that she did not "even need to be here." Pt would like our sleep lab manager to make sure that this data is actually hers, and to call her back to discuss.

## 2017-06-01 NOTE — Telephone Encounter (Addendum)
Pt's daughter, [name], per [facility], returned my call. I advised pt's daughter that Dr. Rexene Alberts reviewed their sleep study results and found that pt has moderate to severe osa with an O2 nadir of 81 %. Pt did not have much REM sleep, and therefore, this study may underestimate the REM-related osa and Dr. Rexene Alberts recommends that pt be treated with a cpap. Dr. Rexene Alberts recommends that pt return for a repeat sleep study in order to properly titrate the cpap and ensure a good mask fit. Pt's daughter is agreeable to returning for a titration study. I advised pt that our sleep lab will file with pt's insurance and call pt to schedule the sleep study when we hear back from the pt's insurance regarding coverage of this sleep study. Pt's daughter verbalized understanding of results. Pt had no questions at this time but was encouraged to call back if questions arise.  Pt's daughter asked that she be contacted to schedule pt's cpap titration study. She can be reached at [phone].

## 2017-06-01 NOTE — Telephone Encounter (Signed)
Left message for patient to return my call and go over sleep study

## 2017-06-03 ENCOUNTER — Telehealth: Payer: Self-pay | Admitting: Family Medicine

## 2017-06-03 NOTE — Telephone Encounter (Signed)
Copied from [place] [phone] [phone]. Topic: Quick Communication - See Telephone Encounter >> Jun 03, 2017  9:51 AM Ether Griffins B wrote: CRM for notification. See Telephone encounter for: 06/03/17.  Pt's daughter calling in regarding her mom feeling anxious and upset in the evenings. She takes LORazepam (ATIVAN) 0.5 MG tablet in the morning. Last year when pt's husband passed away she was taking one in morning and one at night. Ms. Mindi Slicker is wanting to know if it is ok to increase this again for one in the afternoon. I did inform her  Dr. Sherren Mocha is not in the office today she is hoping maybe another provider could give some guidance today to get through the weekend and follow up with Dr. Sherren Mocha next week regarding her mother. Ms. Austin Miles CB#  [phone]

## 2017-06-03 NOTE — Telephone Encounter (Signed)
This is Dr Sherren Mocha pt please Advise if ok to increase her Lorazepam 0.5mg  to twice daily.Thank you

## 2017-06-07 NOTE — Telephone Encounter (Signed)
Okay to take the Ativan 0.5 twice a day

## 2017-06-08 NOTE — Telephone Encounter (Signed)
Spoke with pt daughter voiced understanding that pt can take Lorazepam 0.5 mg twice daily as requested. More refills have been sent to pt pharmacy

## 2017-06-26 ENCOUNTER — Ambulatory Visit (INDEPENDENT_AMBULATORY_CARE_PROVIDER_SITE_OTHER): Payer: Medicare Other | Admitting: Neurology

## 2017-06-26 DIAGNOSIS — G4733 Obstructive sleep apnea (adult) (pediatric): Secondary | ICD-10-CM

## 2017-06-26 DIAGNOSIS — G4720 Circadian rhythm sleep disorder, unspecified type: Secondary | ICD-10-CM

## 2017-06-26 DIAGNOSIS — G479 Sleep disorder, unspecified: Secondary | ICD-10-CM

## 2017-06-26 DIAGNOSIS — H5340 Unspecified visual field defects: Secondary | ICD-10-CM

## 2017-06-26 DIAGNOSIS — I680 Cerebral amyloid angiopathy: Secondary | ICD-10-CM

## 2017-06-26 DIAGNOSIS — R9089 Other abnormal findings on diagnostic imaging of central nervous system: Secondary | ICD-10-CM

## 2017-06-28 ENCOUNTER — Telehealth: Payer: Self-pay

## 2017-06-28 NOTE — Procedures (Signed)
PATIENT'S NAME:  Carmen [name], Carmen [name] DOB:      01/07/1941      MR#:    [id number]     DATE OF RECORDING: 06/26/2017 REFERRING M.D.:  Dr. Ellie Lunch Study Performed:   CPAP  Titration HISTORY: 77 year old woman with a history of osteopenia, tinnitus, glaucoma, Fuchs corneal dystrophy, sleep disturbance and cerebral amyloid angiopathy, who returns for a CPAP Titration study. Her baseline PSG on 05/18/2017 showed an AHI of 24.7 and a REM AHI of 65 and O2 nadir of 81%. BMI of 16.4 kg/m2.  CURRENT MEDICATIONS: Calcium Carbonate, Carboxymethylcellul-Glycerin, Lumigan, Omega-3 and Prednisolone.   PROCEDURE:  This is a multichannel digital polysomnogram utilizing the SomnoStar 11.2 system.  Electrodes and sensors were applied and monitored per AASM Specifications.   EEG, EOG, Chin and Limb EMG, were sampled at 200 Hz.  ECG, Snore and Nasal Pressure, Thermal Airflow, Respiratory Effort, CPAP Flow and Pressure, Oximetry was sampled at 50 Hz. Digital video and audio were recorded.      The patient was fitted with a small Simplus FFM after trying 2 other masks. CPAP was initiated at 4 cmH20 with heated humidity per AASM standards and pressure was advanced to 13 cmH20 because of hypopneas, apneas and desaturations. At a PAP pressure of 13 cmH20, there was a reduction of the AHI to 1.2/hour with non-supine REM sleep achieved and O2 nadir of 93%.  Lights Out was at 21:10 and Lights On at 04:30. Total recording time (TRT) was 440.5 minutes, with a total sleep time (TST) of 262.5 minutes. The patient's sleep latency was 24 minutes. REM latency was 309 minutes, which is markedly delayed. The sleep efficiency was 59.6%, which is reduced.    SLEEP ARCHITECTURE: WASO (Wake after sleep onset) was 96 minutes with moderate sleep fragmentation noted. There were 5 minutes in Stage N1, 196.5 minutes Stage N2, 25.5 minutes Stage N3 and 35.5 minutes in Stage REM.  The percentage of Stage N1 was 1.9%, Stage N2 was 74.9%, which is  increased, Stage N3 was 9.7% and Stage R (REM sleep) was 13.5%, which is mildly reduced. The arousals were noted as: 96 were spontaneous, 0 were associated with PLMs, 119 were associated with respiratory events.  RESPIRATORY ANALYSIS:  There was a total of 174 respiratory events: 101 obstructive apneas, 73 central apneas and 0 mixed apneas with a total of 174 apneas and an apnea index (AI) of 39.8 /hour. There were 0 hypopneas with a hypopnea index of 0/hour. The patient also had 0 respiratory event related arousals (RERAs).      The total APNEA/HYPOPNEA INDEX  (AHI) was 39.8/hour and the total RESPIRATORY DISTURBANCE INDEX was 39.8 0./hour  1 events occurred in REM sleep and 173 events in NREM. The REM AHI was 1.7 /hour versus a non-REM AHI of 45.7 0./hour.  The patient spent 196 minutes of total sleep time in the supine position and 67 minutes in non-supine. The supine AHI was 52.7, versus a non-supine AHI of 1.8.  OXYGEN SATURATION & C02:  The baseline 02 saturation was 96%, with the lowest being 69%. Time spent below 89% saturation equaled 18 minutes.  PERIODIC LIMB MOVEMENTS:  The patient had a total of 0 Periodic Limb Movements. The Periodic Limb Movement (PLM) index was 0 and the PLM Arousal index was 0 /hour.  Audio and video analysis did not show any abnormal or unusual movements, behaviors, phonations or vocalizations. The patient woke up several times and asked the technologists questions regarding treatment options, and  what time it was. The patient took 1 bathroom break. The EKG was in keeping with normal sinus rhythm (NSR).   Post-study, the patient indicated that sleep was the same as usual.   IMPRESSION:   1. Obstructive Sleep Apnea (OSA) 2. Dysfunctions associated with sleep stages or arousal from sleep   RECOMMENDATIONS:   1. This study demonstrates near complete resolution of the patient's obstructive sleep apnea with CPAP therapy. I will, therefore, recommend home CPAP  treatment at a pressure of 13 cm via small full face mask with heated humidity. The patient should be reminded to be fully compliant with PAP therapy to improve sleep related symptoms and decrease long term cardiovascular risks. The patient should be reminded, that it may take up to 3 months to get fully used to using PAP with all planned sleep. The earlier full compliance is achieved, the better long term compliance tends to be. Please note that untreated obstructive sleep apnea may carry additional perioperative morbidity. Patients with significant obstructive sleep apnea should receive perioperative PAP therapy and the surgeons and particularly the anesthesiologist should be informed of the diagnosis and the severity of the sleep disordered breathing. 2. This study shows sleep fragmentation and abnormal sleep stage percentages; these are nonspecific findings and per se do not signify an intrinsic sleep disorder or a cause for the patient's sleep-related symptoms. Causes include (but are not limited to) the first night effect of the sleep study, circadian rhythm disturbances, medication effect or an underlying mood disorder or medical problem.  3. The patient should be cautioned not to drive, work at heights, or operate dangerous or heavy equipment when tired or sleepy. Review and reiteration of good sleep hygiene measures should be pursued with any patient. 4. The patient will be seen in follow-up by Dr. Rexene Alberts at [facility] for discussion of the test results and further management strategies. The referring provider will be notified of the test results.   I certify that I have reviewed the entire raw data recording prior to the issuance of this report in accordance with the Standards of Accreditation of the American Academy of Sleep Medicine (AASM)    Star Age, MD, PhD Diplomat, American Board of Psychiatry and Neurology (Neurology and Sleep Medicine)

## 2017-06-28 NOTE — Addendum Note (Signed)
Addended by: Star Age on: 06/28/2017 08:06 AM   Modules accepted: Orders

## 2017-06-28 NOTE — Telephone Encounter (Signed)
-----   Message from Star Age, MD sent at 06/28/2017  8:06 AM EDT ----- Patient referred by Dr. Ellie Lunch, seen by me on 03/28/17, diagnostic PSG on 05/18/17. Patient had a CPAP titration study on 06/26/17.  Please call and inform patient that I have entered an order for treatment with positive airway pressure (PAP) treatment for obstructive sleep apnea (OSA). She did well during the latest sleep study with CPAP. We will, therefore, arrange for a machine for home use through a DME (durable medical equipment) company of Her choice; and I will see the patient back in follow-up in about 10 weeks. Please also explain to the patient that I will be looking out for compliance data, which can be downloaded from the machine (stored on an SD card, that is inserted in the machine) or via remote access through a modem, that is built into the machine. At the time of the followup appointment we will discuss sleep study results and how it is going with PAP treatment at home. Please advise patient to bring Her machine at the time of the first FU visit, even though this is cumbersome. Bringing the machine for every visit after that will likely not be needed, but often helps for the first visit to troubleshoot if needed. Please re-enforce the importance of compliance with treatment and the need for [place] to monitor compliance data - often an insurance requirement and actually good feedback for the patient as far as how they are doing.  Also remind patient, that any interim PAP machine or mask issues should be first addressed with the DME company, as they can often help better with technical and mask fit issues. Please ask if patient has a preference regarding DME company.  Please also make sure, the patient has a follow-up appointment with me in about 10 weeks from the setup date, thanks. May see one of our nurse practitioners if needed for proper timing of the FU appointment.  Please fax or rout report to the referring provider.  Thanks,   Star Age, MD, PhD [facility])

## 2017-06-28 NOTE — Telephone Encounter (Signed)
I called pt to discuss her sleep study results, no answer, left a message asking her to call me back.  I called pt's daughter, [name], per DPR. I advised pt's daughter that Dr. Rexene Alberts reviewed pt's sleep study results and found that pt did well with the cpap during the latest sleep study. Dr. Winn Jock recommends that pt start a cpap at home. I reviewed PAP compliance expectations with the pt's daughter. Pt's daughter is agreeable to starting a CPAP. I advised pt's daughter that an order will be sent to a DME, [facility], and [facility] will call the pt within about one week after they file with the pt's insurance. [facility] will show the pt how to use the machine, fit for masks, and troubleshoot the CPAP if needed. A follow up appt was made for insurance purposes with Dr. Rexene Alberts on 10/06/17 at 8:30am. Pt verbalized understanding to arrive 15 minutes early and bring their CPAP. Pt's daughter verbalized understanding of results. Pt's daughter had no questions at this time but was encouraged to call back if questions arise.  When pt returns my call, I will discuss these results with her as well and discuss sending her a letter with this information.

## 2017-06-28 NOTE — Progress Notes (Signed)
Patient referred by Dr. Ellie Lunch, seen by me on 03/28/17, diagnostic PSG on 05/18/17. Patient had a CPAP titration study on 06/26/17.  Please call and inform patient that I have entered an order for treatment with positive airway pressure (PAP) treatment for obstructive sleep apnea (OSA). She did well during the latest sleep study with CPAP. We will, therefore, arrange for a machine for home use through a DME (durable medical equipment) company of Her choice; and I will see the patient back in follow-up in about 10 weeks. Please also explain to the patient that I will be looking out for compliance data, which can be downloaded from the machine (stored on an SD card, that is inserted in the machine) or via remote access through a modem, that is built into the machine. At the time of the followup appointment we will discuss sleep study results and how it is going with PAP treatment at home. Please advise patient to bring Her machine at the time of the first FU visit, even though this is cumbersome. Bringing the machine for every visit after that will likely not be needed, but often helps for the first visit to troubleshoot if needed. Please re-enforce the importance of compliance with treatment and the need for [place] to monitor compliance data - often an insurance requirement and actually good feedback for the patient as far as how they are doing.  Also remind patient, that any interim PAP machine or mask issues should be first addressed with the DME company, as they can often help better with technical and mask fit issues. Please ask if patient has a preference regarding DME company.  Please also make sure, the patient has a follow-up appointment with me in about 10 weeks from the setup date, thanks. May see one of our nurse practitioners if needed for proper timing of the FU appointment.  Please fax or rout report to the referring provider. Thanks,   Star Age, MD, PhD [facility])

## 2017-06-29 NOTE — Telephone Encounter (Signed)
Pt has not returned my call.  I called pt again to discuss, make sure she understood the results and process of starting cpap, as most likely discussed with the pt by pt's daughter.  No answer, left a message asking her to call me back.

## 2017-07-04 ENCOUNTER — Encounter: Payer: Self-pay | Admitting: Neurology

## 2017-07-04 ENCOUNTER — Encounter: Payer: Self-pay | Admitting: Family Medicine

## 2017-07-05 ENCOUNTER — Telehealth: Payer: Self-pay | Admitting: Family Medicine

## 2017-07-05 NOTE — Telephone Encounter (Signed)
Copied from [place] [id number]. Topic: Quick Communication - See Telephone Encounter >> Jul 05, 2017  9:26 AM Cleaster Corin, NT wrote: CRM for notification. See Telephone encounter for: 07/05/17.  Pt. Daughter [name]  wanting to speak with nurse about pts. Medication and see if changes can be made. [name] can be reached ar [phone]

## 2017-07-05 NOTE — Telephone Encounter (Signed)
[facility] advised me that they have the pt scheduled for Friday, 07/08/17 for cpap set up and pt advised them that she would prefer to use them over [facility] due to quick set up.  I called pt to confirm this. She reports that she decided to use [facility] due to being able to be set up so quickly, and asked that I retract the order to [facility].   Order to [facility] has been retracted.

## 2017-07-05 NOTE — Telephone Encounter (Signed)
Pt daughter states that mom keeps crying and is depressed, pt is on Lorazepam for anxiety but daughter states that she doesn't think this medication is helping the mother with her depression, she requests for Advise or to have the medication changed to something better that will help pt.

## 2017-07-05 NOTE — Telephone Encounter (Signed)
I called pt. I advised pt that Dr. Rexene Alberts reviewed their sleep study results and found that pt did well with the cpap during the latest sleep study. Dr. Rexene Alberts recommends that pt start a cpap at home. I reviewed PAP compliance expectations with the pt. Pt is agreeable to starting a CPAP. I advised pt that an order will be sent to a DME, [facility], and [facility] will call the pt within about one week after they file with the pt's insurance. [facility] will show the pt how to use the machine, fit for masks, and troubleshoot the CPAP if needed. A follow up appt was made for insurance purposes with Dr. Rexene Alberts on 10/06/17 at 8:30am. Pt verbalized understanding to arrive 15 minutes early and bring their CPAP. A letter with all of this information in it will be mailed to the pt as a reminder. I verified with the pt that the address we have on file is correct. Pt verbalized understanding of results. Pt had no questions at this time but was encouraged to call back if questions arise.  Pt's order was sent to [facility] originally, after speaking with pt's daughter, but I will retract this order and send to [facility].

## 2017-07-12 ENCOUNTER — Telehealth: Payer: Self-pay | Admitting: Neurology

## 2017-07-12 NOTE — Telephone Encounter (Signed)
I called pt to discuss. No answer, left a message asking her to call me back. 

## 2017-07-12 NOTE — Telephone Encounter (Signed)
Pt requesting a call, stating she is having difficult time with fitting her new CPAP. Also would like to discuss coming in sooner. Pt stating she has to leave for another appt in 30 mins FYI

## 2017-07-12 NOTE — Telephone Encounter (Addendum)
I called pt. She reports that she has called several companies to get help using her cpap. She says that she called [employer] but they did not answer, but the pt did not leave a message. She is having trouble using her cpap, but cannot tell me exactly what difficulty that she is having, other than that the mask "doesn't fit right." I encouraged her to call [employer] and leave them a message and they will call her back. Pt says "no one seems to be able to help me." Pt then tells me that she needs someone to show her how to use the cpap. I offered to speak with Shirlean Mylar, our sleep lab manager, to see if Shirlean Mylar can meet with the pt here in the office to help her with the cpap. Robin cannot go to pt's home, however. Pt verbalized understanding and appreciation.

## 2017-07-12 NOTE — Telephone Encounter (Signed)
Patient is returning your call.  

## 2017-07-13 NOTE — Telephone Encounter (Signed)
Received this notice from [employer]: "Spoke with pt. she is going to bring machine and mask in tomorrow 6/5 at 2pm for re-education and mask fit."

## 2017-07-13 NOTE — Telephone Encounter (Signed)
Received this notice from [place]: "I returned her call and she advised that she would like to cancel her appointment at this time due to having company in town. she advised she would call to reschedule a reeducation visit at a later time if needed. She asked I let Timmothy Sours know that she is doing better. I have let Timmothy Sours know she advised she is doing better."

## 2017-07-15 ENCOUNTER — Telehealth: Payer: Self-pay | Admitting: *Deleted

## 2017-07-15 NOTE — Telephone Encounter (Signed)
Thank you. Will await Dr. Honor Junes approval before scheduling.

## 2017-07-15 NOTE — Telephone Encounter (Signed)
Copied from [place] [id number]. Topic: Appointment Scheduling - Scheduling Inquiry for Clinic >> Jul 15, 2017  9:48 AM Valla Leaver wrote: Reason for CRM: Patient would like to switch PCP from Dr. Sherren Mocha at [facility] to Dr. Juleen China at [facility]. Please call patient back to confirm.

## 2017-07-15 NOTE — Telephone Encounter (Signed)
Dr. Sherren Mocha and Dr. Juleen China, okay to transfer?

## 2017-07-15 NOTE — Telephone Encounter (Signed)
Okay 

## 2017-07-18 NOTE — Telephone Encounter (Signed)
Okay to transfer............ however she's going through grief reaction from the death of her husband and is very emotionally unstable. I recommend she see somebody at the mental health division. If she decides she wants to know back I be happy to take her back

## 2017-07-18 NOTE — Telephone Encounter (Signed)
See note. Please schedule patient TOC appointment to Dr. Juleen China

## 2017-07-18 NOTE — Telephone Encounter (Signed)
Noted. Carmen [name], can you have someone on your team reach out to patient to schedule [facility] w/ Dr. Juleen China? Thanks!

## 2017-07-18 NOTE — Telephone Encounter (Signed)
FYI

## 2017-07-18 NOTE — Telephone Encounter (Signed)
Called pt and left vm to make TOC appt with Dr. Juleen China.

## 2017-07-21 ENCOUNTER — Telehealth: Payer: Self-pay | Admitting: Family Medicine

## 2017-07-21 NOTE — Telephone Encounter (Signed)
Noted  

## 2017-07-21 NOTE — Telephone Encounter (Signed)
Copied from [place]. Topic: General - Other >> Jul 21, 2017  3:47 PM Synthia Innocent wrote: Reason for CRM: [facility] want to know if he received form today, need to be completed and faxed back with most recent office note fax [phone]

## 2017-07-21 NOTE — Telephone Encounter (Signed)
Pt said she called [employer] and did take her machine and mask in. She said the mask was replaced with a softer mask and she did well the 1st night but last night was not as well, she felt the pressure was higher. Pt said if she finds she is not able to sleep well she will contact [employer].   FYI

## 2017-07-22 NOTE — Telephone Encounter (Signed)
Form received on 07/22/2017 awaits completion from Dr Sherren Mocha.

## 2017-07-25 NOTE — Telephone Encounter (Signed)
Pt's daughter of pt called and asked to have someone call her b/c the forms need to be filled out apparently by Dr. Sherren Mocha, pt has not been seen by Dr. Juleen China yet, call to advise

## 2017-07-25 NOTE — Telephone Encounter (Signed)
Spoke to pt daughter stated that she needed the forms completed to move the mother to [facility] and that Dr Sherren Mocha has been the only primary provider for the pt, explained that dr Sherren Mocha is aware and has stated to have Dr Juleen China complete the forms at the transfer of care appointment. Pt daughter state that the visit is not until July and [facility] will not wait that long. Pt daughter stated that she will come to the office in the morning to see if this can be solved since she is meeting with [facility] tomorrow afternoon. Left information with Apolonio Schneiders to pass on to [place] for Advise.

## 2017-07-25 NOTE — Telephone Encounter (Signed)
Dr Sherren Mocha states that the forms received from [facility] should be completed by Dr Carmen [name] since pt has transferred care to Dr Carmen [name], pt is aware.

## 2017-07-27 NOTE — Telephone Encounter (Signed)
Forms have been completed and picked up from the office by pt daughter.

## 2017-08-22 ENCOUNTER — Ambulatory Visit: Payer: Self-pay | Admitting: Family Medicine

## 2017-08-30 DIAGNOSIS — H1131 Conjunctival hemorrhage, right eye: Secondary | ICD-10-CM | POA: Diagnosis not present

## 2017-08-30 DIAGNOSIS — H401133 Primary open-angle glaucoma, bilateral, severe stage: Secondary | ICD-10-CM | POA: Diagnosis not present

## 2017-09-21 DIAGNOSIS — M720 Palmar fascial fibromatosis [Dupuytren]: Secondary | ICD-10-CM | POA: Diagnosis not present

## 2017-09-22 DIAGNOSIS — Z1231 Encounter for screening mammogram for malignant neoplasm of breast: Secondary | ICD-10-CM | POA: Diagnosis not present

## 2017-09-25 NOTE — Progress Notes (Signed)
Carmen [name] is a 77 y.o. female is here to [place].   Patient Care Team: Briscoe Deutscher, DO as PCP - General (Family Medicine)   History of Present Illness:   Carmen [name], [name] acting as scribe for Dr. Briscoe Deutscher.   HPI: Patient in office today to establish care. Her daughter is a patient at our office as well. She was started on Ativan after husband passed away about a year ago. Patient daughter states that she is concerned that she is having symptoms of depression later in the evening. Would like to talk about other options for medications. She currently takes the Ativan every morning. She can take in the evening but she does not want to take more than she has to. She has only taken a handful of times. She does get increased anxiety in the evening when it wears off.   Health Maintenance Due  Topic Date Due  . INFLUENZA VACCINE  09/08/2017   Depression screen PHQ 2/9 09/26/2017  Decreased Interest 0  Down, Depressed, Hopeless 2  PHQ - 2 Score 2  Altered sleeping 0  Tired, decreased energy 0  Change in appetite 0  Feeling bad or failure about yourself  0  Trouble concentrating 2  Moving slowly or fidgety/restless 0  Suicidal thoughts 0  PHQ-9 Score 4  Difficult doing work/chores Not difficult at all   PMHx, SurgHx, SocialHx, Medications, and Allergies were reviewed in the Visit Navigator and updated as appropriate.   Past Medical History:  Diagnosis Date  . Bleb x 4   . Dermatitis due to solar radiation   . Dyshidrosis 05/03/2007  . Glaucoma   . Heart murmur   . Osteopenia   . Tinnitus      Past Surgical History:  Procedure Laterality Date  . MASTECTOMY    . PLACEMENT OF BREAST IMPLANTS       Family History  Problem Relation Age of Onset  . Stroke Other   . Heart disease Other   . Breast cancer Other    Social History   Tobacco Use  . Smoking status: Never Smoker  . Smokeless tobacco: Never Used  Substance Use Topics  . Alcohol use:  Never    Frequency: Never  . Drug use: Never    Current Medications and Allergies:   .  aspirin EC 81 MG tablet, Take 81 mg by mouth daily., Disp: , Rfl:  .  Calcium Carbonate-Vit D-Min (CALCIUM 1200 PO), Take 2,400 mg by mouth daily., Disp: , Rfl:  .  cyanocobalamin (,VITAMIN B-12,) 1000 MCG/ML injection, 1 mL monthly, Disp: 30 mL, Rfl: 1 .  LORazepam (ATIVAN) 0.5 MG tablet, 1 by mouth every morning, Disp: 30 tablet, Rfl: 1 .  LUMIGAN 0.01 % SOLN, Place 1 drop into both eyes daily. , Disp: , Rfl:  .  pravastatin (PRAVACHOL) 10 MG tablet, Take 1 tablet (10 mg total) by mouth daily., Disp: 100 tablet, Rfl: 4    Allergies  Allergen Reactions  . Sulfonamide Derivatives Other (See Comments)    Doesn't remember   Review of Systems:   Pertinent items are noted in the HPI. Otherwise, ROS is negative.  Vitals:   Vitals:   09/26/17 1244  BP: 138/82  Pulse: 86  Temp: 98.2 F (36.8 C)  TempSrc: Oral  SpO2: 97%  Weight: 115 lb 4.8 oz (52.3 kg)  Height: 5\' 3"  (1.6 m)     Body mass index is 20.42 kg/m.  Physical Exam:  Physical Exam  Constitutional: She appears well-nourished.  HENT:  Head: Normocephalic and atraumatic.  Eyes: Pupils are equal, round, and reactive to light. EOM are normal.  Neck: Normal range of motion. Neck supple.  Cardiovascular: Normal rate, regular rhythm, normal heart sounds and intact distal pulses.  Pulmonary/Chest: Effort normal.  Abdominal: Soft.  Skin: Skin is warm.  Psychiatric: She has a normal mood and affect. Her behavior is normal.  Nursing note and vitals reviewed.  Results for orders placed or performed in visit on 05/17/17  Lipid panel  Result Value Ref Range   Cholesterol 182 0 - 200 mg/dL   Triglycerides 58.0 0.0 - 149.0 mg/dL   HDL 69.40 >39.00 mg/dL   VLDL 11.6 0.0 - 40.0 mg/dL   LDL Cholesterol 101 (H) 0 - 99 mg/dL   Total CHOL/HDL Ratio 3    NonHDL 112.77   Hepatic function panel  Result Value Ref Range   Total Bilirubin  0.4 0.2 - 1.2 mg/dL   Bilirubin, Direct 0.1 0.0 - 0.3 mg/dL   Alkaline Phosphatase 62 39 - 117 U/L   AST 20 0 - 37 U/L   ALT 18 0 - 35 U/L   Total Protein 6.7 6.0 - 8.3 g/dL   Albumin 4.3 3.5 - 5.2 g/dL   Assessment and Plan:   Carmen [name] was seen today for establish care.  Diagnoses and all orders for this visit:  Situational depression -     escitalopram (LEXAPRO) 10 MG tablet; Take 1 tablet (10 mg total) by mouth daily.  Situational anxiety -     LORazepam (ATIVAN) 0.5 MG tablet; 1 by mouth every morning  Pernicious anemia -     cyanocobalamin (,VITAMIN B-12,) 1000 MCG/ML injection; 1 mL monthly -     NEEDLE, DISP, 27 G (BD ECLIPSE NEEDLE) 27G X 1/2" MISC; 1 mL by Does not apply route every 30 (thirty) days.  Pure hypercholesterolemia -     pravastatin (PRAVACHOL) 10 MG tablet; Take 1 tablet (10 mg total) by mouth daily.    . Reviewed expectations re: course of current medical issues. . Discussed self-management of symptoms. . Outlined signs and symptoms indicating need for more acute intervention. . Patient verbalized understanding and all questions were answered. Marland Kitchen Health Maintenance issues including appropriate healthy diet, exercise, and smoking avoidance were discussed with patient. . See orders for this visit as documented in the electronic medical record. . Patient received an After Visit Summary.  [name] served as Education administrator during this visit. History, Physical, and Plan performed by medical provider. The above documentation has been reviewed and is accurate and complete. Briscoe Deutscher, D.O.  Briscoe Deutscher, DO [place], [place] 10/02/2017

## 2017-09-26 ENCOUNTER — Encounter: Payer: Self-pay | Admitting: Family Medicine

## 2017-09-26 ENCOUNTER — Ambulatory Visit (INDEPENDENT_AMBULATORY_CARE_PROVIDER_SITE_OTHER): Payer: Medicare Other | Admitting: Family Medicine

## 2017-09-26 VITALS — BP 138/82 | HR 86 | Temp 98.2°F | Ht 63.0 in | Wt 115.3 lb

## 2017-09-26 DIAGNOSIS — E7800 Pure hypercholesterolemia, unspecified: Secondary | ICD-10-CM

## 2017-09-26 DIAGNOSIS — D510 Vitamin B12 deficiency anemia due to intrinsic factor deficiency: Secondary | ICD-10-CM

## 2017-09-26 DIAGNOSIS — F418 Other specified anxiety disorders: Secondary | ICD-10-CM | POA: Diagnosis not present

## 2017-09-26 DIAGNOSIS — F4321 Adjustment disorder with depressed mood: Secondary | ICD-10-CM | POA: Diagnosis not present

## 2017-09-26 MED ORDER — PRAVASTATIN SODIUM 10 MG PO TABS: 10.0000 mg | ORAL_TABLET | Freq: Every day | ORAL | 4 refills | Status: DC

## 2017-09-26 MED ORDER — "NEEDLE (DISP) 27G X 1/2"" MISC": 1.0000 mL | 1 refills | Status: DC

## 2017-09-26 MED ORDER — ESCITALOPRAM OXALATE 10 MG PO TABS: 10.0000 mg | ORAL_TABLET | Freq: Every day | ORAL | 1 refills | Status: DC

## 2017-09-26 MED ORDER — CYANOCOBALAMIN 1000 MCG/ML IJ SOLN: INTRAMUSCULAR | 1 refills | Status: DC

## 2017-09-26 MED ORDER — LORAZEPAM 0.5 MG PO TABS: ORAL_TABLET | ORAL | 1 refills | Status: DC

## 2017-09-27 DIAGNOSIS — H401133 Primary open-angle glaucoma, bilateral, severe stage: Secondary | ICD-10-CM | POA: Diagnosis not present

## 2017-09-28 ENCOUNTER — Ambulatory Visit (INDEPENDENT_AMBULATORY_CARE_PROVIDER_SITE_OTHER): Payer: Medicare Other | Admitting: Neurology

## 2017-09-28 ENCOUNTER — Encounter: Payer: Self-pay | Admitting: Neurology

## 2017-09-28 VITALS — BP 147/82 | HR 80 | Ht 63.0 in | Wt 115.0 lb

## 2017-09-28 DIAGNOSIS — H5340 Unspecified visual field defects: Secondary | ICD-10-CM

## 2017-09-28 DIAGNOSIS — Z9989 Dependence on other enabling machines and devices: Secondary | ICD-10-CM | POA: Diagnosis not present

## 2017-09-28 DIAGNOSIS — G4733 Obstructive sleep apnea (adult) (pediatric): Secondary | ICD-10-CM

## 2017-09-28 DIAGNOSIS — Z789 Other specified health status: Secondary | ICD-10-CM

## 2017-09-28 DIAGNOSIS — I680 Cerebral amyloid angiopathy: Secondary | ICD-10-CM | POA: Diagnosis not present

## 2017-09-28 NOTE — Patient Instructions (Addendum)
Please make an appointment with your DME company, [employer], for a mask refit and explain your mask and headgear issues, mouth dryness, air leak ets. Please have them explain how to change the humidifier setting and ramp time.   You may be able to try another type of mask.  Talk to your dentist about an oral appliance for sleep apnea management. If needed, I can make a referral too.  You have done a good job so far! Please continue using your CPAP regularly. While your insurance requires that you use CPAP at least 4 hours each night on 70% of the nights, I recommend, that you not skip any nights and use it throughout the night if you can. Getting used to CPAP and staying with the treatment long term does take time and patience and discipline. Untreated obstructive sleep apnea when it is moderate to severe can have an adverse impact on cardiovascular health and raise her risk for heart disease, arrhythmias, hypertension, congestive heart failure, stroke and diabetes. Untreated obstructive sleep apnea causes sleep disruption, nonrestorative sleep, and sleep deprivation. This can have an impact on your day to day functioning and cause daytime sleepiness and impairment of cognitive function, memory loss, mood disturbance, and problems focussing. Using CPAP regularly can improve these symptoms.

## 2017-09-28 NOTE — Progress Notes (Signed)
Subjective:    Patient ID: Carmen [name] is a 77 y.o. female.  HPI     Interim history:   Carmen [name] is a 77 year old right-handed woman with an underlying medical history of osteopenia, tinnitus, glaucoma, Fuchs corneal dystrophy, depression and anxiety, who presents for follow-up consultation of Carmen [name] obstructive sleep apnea after sleep study testing. The patient is accompanied by Carmen [name] daughter today. Carmen first met Carmen [name] on 03/28/2017 at the request of Carmen [name] ophthalmologist, at which time she had a new left quadrantanopsia that was found on routine eye exam and a brain MRI showed microhemorrhages throughout the brain in keeping with severe cerebral amyloid angiopathy. We talked about vascular disease prevention and she was advised to proceed with sleep study testing to rule out obstructive sleep apnea. She had a baseline sleep study, followed by a CPAP titration study. Carmen [name] baseline sleep study from 05/18/2017 showed a sleep latency of 40.5 minutes, sleep latency was 70.5%, REM latency markedly delayed at 438.5 minutes. She had an increased percentage of light stage sleep, absence of slow-wave sleep and near absence of REM sleep. Total AHI was in the moderate range at 24.7 per hour, REM AHI was 65 per hour, supine AHI was 25.2 per hour. Average oxygen saturation was 98%, nadir was 81%. She has no significant PLMS or EKG or EEG changes. She was advised to proceed with a full night CPAP titration study. She had this on 06/26/2017. She was fitted with a full facemask after trying to other types of masks. CPAP was titrated from 4 cm to 13 cm. Sleep efficiency was reduced at 59.6%, sleep latency was 24 minutes, REM latency delayed at 309 minutes. On the final pressure of 13 cm Carmen [name] AHI was 1.2 per hour, with nonsupine REM sleep achieved an O2 nadir of 93%. She had an increased percentage of stage II sleep, slow-wave sleep was 9.7% and REM sleep was 13.5%. She had no significant PLMS. Based on Carmen [name] test results Carmen  prescribed CPAP therapy for home use at a pressure of 13 cm.   Today, 09/28/2017: Carmen reviewed Carmen [name] CPAP compliance data from 08/28/2017 through 09/26/2017 which is a total of 30 days, during which time she used Carmen [name] CPAP every night with percent used days greater than 4 hours at 90%, indicating excellent compliance with an average usage of 6 hours and 11 minutes, residual AHI suboptimal at 9.2 per hour, leak at times high, pressure of 13 cm. She reports that CPAP is going well. Did take Carmen [name] a while to get used to CPAP. She is currently on an air touch full facemask but notices air leaking and sometimes a blowing into Carmen [name] eyes. She had a follow-up appointment with Carmen [name] eye doctor just recently as she had an increase in Carmen [name] eye pressure which was deemed secondary to steroid use and after stopping the steroid Carmen [name] eye pressure thankfully did come down. She is otherwise stable. She has no new neurological symptoms. She is wondering if she could be a candidate for an oral appliance.  The patient's allergies, current medications, family history, past medical history, past social history, past surgical history and problem list were reviewed and updated as appropriate.   Previously:   03/28/2017: (A) routine eye exam which showed a new possible superior left quadrantanopsia which you investigated further with a brain MRI. She had a brain MRI with and without contrast on 03/18/2017 and Carmen reviewed the results through the PACS system but also in report: Impression: No acute intracranial abnormality.  Innumerable peripheral predominant chronic microhemorrhages throughout the brain, consistent with severe cerebral amyloid angiopathy. Confluent white matter disease is probably a sequela of this disease and a component of chronic small vessel ischemia. Carmen reviewed your office records from 03/17/2017, which you kindly included. She reports that she went for Carmen [name] yearly routine eye exam at the time. She reports no actual visual  symptoms at the time. She has no history of daytime somnolence but she has been told that she snores, particularly when she shares a bedroom on a trip with friends, she has been told that she snores significantly. Epworth sleepiness score is 0 out of 24, fatigue score is 9 out of 63. She is widowed and lives alone, she has 3 children, nonsmoker, drinks alcohol occasionally, drinks caffeine in limitation. She does admit that she does not sleep very well. Carmen [name] son is concerned that she does not sleep very much. She tries to exercise in the form of walking. She is a retired Marine scientist. She currently wakes up around 5 or 5:30, sometimes she goes [name] to sleep. She has had occasional mild Carmen [name] morning headaches recently. She has nocturia about once per average night. She has another daughter in [place] and a total of 11 grandchildren, she has good support through Carmen [name] family. Of note, she did have a tonsillectomy as a child. She has lost a little over 10 pounds over the past year. She does admit that she was not eating right after Carmen [name] husband (of over 69 years) died last year.  Carmen [name] Past Medical History Is Significant For: Past Medical History:  Diagnosis Date  . Bleb    X 4  . CERUMEN IMPACTION 05/27/2008  . DERMATITIS DUE TO SOLAR RADIATION 05/03/2007  . Dyshidrosis 05/03/2007  . FOOT PAIN, LEFT 08/16/2007  . GLAUCOMA NOS 09/23/2009  . Heart murmur   . Inflamed seborrheic keratosis 12/26/2009  . OSTEOPENIA 05/03/2007  . Tinnitus     Carmen [name] Past Surgical History Is Significant For: Past Surgical History:  Procedure Laterality Date  . BREAST SURGERY     mastectomy  . BREAST SURGERY     implants    Carmen [name] Family History Is Significant For: Family History  Problem Relation Age of Onset  . Cancer Other        breast  . Stroke Other        stroke  . Heart disease Other     Carmen [name] Social History Is Significant For: Social History   Socioeconomic History  . Marital status: Married    Spouse name: Not on file   . Number of children: Not on file  . Years of education: Not on file  . Highest education level: Not on file  Occupational History  . Not on file  Social Needs  . Financial resource strain: Not on file  . Food insecurity:    Worry: Not on file    Inability: Not on file  . Transportation needs:    Medical: Not on file    Non-medical: Not on file  Tobacco Use  . Smoking status: Never Smoker  . Smokeless tobacco: Never Used  Substance and Sexual Activity  . Alcohol use: Not on file  . Drug use: Not on file  . Sexual activity: Not on file  Lifestyle  . Physical activity:    Days per week: Not on file    Minutes per session: Not on file  . Stress: Not on file  Relationships  . Social connections:  Talks on phone: Not on file    Gets together: Not on file    Attends religious service: Not on file    Active member of club or organization: Not on file    Attends meetings of clubs or organizations: Not on file    Relationship status: Not on file  Other Topics Concern  . Not on file  Social History Narrative  . Not on file    Carmen [name] Allergies Are:  Allergies  Allergen Reactions  . Sulfonamide Derivatives Other (See Comments)    Doesn't remember  :   Carmen [name] Current Medications Are:  Outpatient Encounter Medications as of 09/28/2017  Medication Sig  . aspirin EC 81 MG tablet Take 81 mg by mouth daily.  . Carboxymethylcellul-Glycerin (REFRESH OPTIVE OP) Apply to eye.  . cyanocobalamin (,VITAMIN B-12,) 1000 MCG/ML injection 1 mL monthly  . escitalopram (LEXAPRO) 10 MG tablet Take 1 tablet (10 mg total) by mouth daily.  Marland Kitchen LORazepam (ATIVAN) 0.5 MG tablet 1 by mouth every morning  . LUMIGAN 0.01 % SOLN Place 1 drop into both eyes daily.   Marland Kitchen NEEDLE, DISP, 27 G (BD ECLIPSE NEEDLE) 27G X 1/2" MISC 1 mL by Does not apply route every 30 (thirty) days.  . pravastatin (PRAVACHOL) 10 MG tablet Take 1 tablet (10 mg total) by mouth daily.  . [DISCONTINUED] Calcium Carbonate-Vit D-Min  (CALCIUM 1200 PO) Take 2,400 mg by mouth daily.   No facility-administered encounter medications on file as of 09/28/2017.   :  Review of Systems:  Out of a complete 14 point review of systems, all are reviewed and negative with the exception of these symptoms as listed below: Review of Systems  Neurological:       Pt presents today to discuss Carmen [name] cpap, which she reports is going well.    Objective:  Neurological Exam  Physical Exam Physical Examination:   Vitals:   09/28/17 1301  BP: (!) 147/82  Pulse: 80   General Examination: The patient is a very pleasant 77 y.o. female in no acute distress. She is slender, well groomed.   HEENT: Normocephalic, atraumatic, pupils are equal, round and reactive to light and accommodation. Extraocular tracking is good without limitation to gaze excursion or nystagmus noted. Normal smooth pursuit is noted. Hearing is grossly intact. Face is symmetric with normal facial animation and normal facial sensation. Speech is clear with no dysarthria noted. There is no hypophonia. There is no lip, neck/head, jaw or voice tremor. Neck is supple with full range of passive and active motion. There are no carotid bruits on auscultation. Oropharynx exam reveals: mild mouth dryness, adequate dental hygiene and mild airway crowding. She has a slender neck circumference. Minimal overbite.   Chest: Clear to auscultation without wheezing, rhonchi or crackles noted.  Heart: S1+S2+0, regular and normal without murmurs, rubs or gallops noted.   Abdomen: Soft, non-tender and non-distended with normal bowel sounds appreciated on auscultation.  Extremities: There is no pitting edema in the distal lower extremities bilaterally.   Skin: Warm and dry without trophic changes noted.  Musculoskeletal: exam reveals no obvious joint deformities, tenderness or joint swelling or erythema.   Neurologically:  Mental status: The patient is awake, alert and oriented in all 4  spheres. Carmen [name] immediate and remote memory, attention, language skills and fund of knowledge are appropriate. There is no evidence of aphasia, agnosia, apraxia or anomia. Speech is clear with normal prosody and enunciation. Thought process is linear. Mood is normal and affect is  normal.  Cranial nerves II - XII are as described above under HEENT exam.  Motor exam: Normal bulk, strength and tone is noted. There is no tremor. Fine motor skills and coordination: grossly intact.  Cerebellar testing: No dysmetria or intention tremor.  Sensory exam: intact to light touch in the upper and lower extremities.  Gait, station and balance: She stands easily. No veering to one side is noted. No leaning to one side is noted. Posture is age-appropriate and stance is narrow based. Gait shows normal stride length and normal pace. No problem turning.   Assessment and Plan:   In summary, GISELL BUEHRLE is a very pleasant 77 year old female with an underlying medical history of osteopenia, tinnitus, glaucoma, Fuchs corneal dystrophy, status post corneal transplant, recent abnormal brain MRI with findings in keeping with cerebral amyloid angiopathy and microhemorrhages secondary to that. She had generalized white matter changes as well. She had left quadrant anopsia which was noted on a routine eye exam. Carmen [name] neurological exam was nonfocal, she had no cognitive complaints. She is in a retirement community at this point. She has been diagnosed with moderate obstructive sleep apnea based on Carmen [name] baseline sleep study in April 2019. She had a CPAP titration study in May 2019 and after initial difficulty, has adapted well to CPAP therapy and is fully compliant with it. Residual AHI suboptimal at 9 per hour but she also has a higher leak from the mask. She is using a fullface mask and is advised to contact Carmen [name] DME company regarding a mask refit. She may do better with a different type of mask. She is also worried about air leaking  and blowing into Carmen [name] eyes. She does have a history of corneal transplant and glaucoma. Recently she had some pressure increase in Carmen [name] eyes but this was deemed secondary to prednisone. She is stable with Carmen [name] weight. She was recently started on Lexapro for residual anxiety and depression. The hope is that she can come off of Carmen [name] benzodiazepine. She is encouraged to continue with CPAP therapy. We talked about alternative treatment options at length today. She would like to explore an oral appliance if possible. She is encouraged to talk to Carmen [name] on dentist first about this and if needed Carmen can make a referral for a specialized dentist for consideration of an oral appliance. In the meantime, she is motivated to continue using Carmen [name] CPAP regularly. She is commended for Carmen [name] treatment adherence. Carmen [name] routinely in 6 months, sooner if needed. Carmen answered all their questions today and the patient and Carmen [name] daughter were in agreement. Carmen spent 40 minutes in total face-to-face time with the patient, more than 50% of which was spent in counseling and coordination of care, reviewing test results, reviewing medication and discussing or reviewing the diagnosis of OSA, its prognosis and treatment options. Pertinent laboratory and imaging test results that were available during this visit with the patient were reviewed by me and considered in my medical decision making (see chart for details).

## 2017-09-28 NOTE — Progress Notes (Signed)
Order for mask refit sent to [facility].

## 2017-10-02 ENCOUNTER — Encounter: Payer: Self-pay | Admitting: Family Medicine

## 2017-10-03 ENCOUNTER — Encounter: Payer: Self-pay | Admitting: Gastroenterology

## 2017-10-06 ENCOUNTER — Ambulatory Visit: Payer: Self-pay | Admitting: Neurology

## 2017-11-10 ENCOUNTER — Ambulatory Visit (INDEPENDENT_AMBULATORY_CARE_PROVIDER_SITE_OTHER): Payer: Medicare Other | Admitting: Psychology

## 2017-11-10 DIAGNOSIS — F4323 Adjustment disorder with mixed anxiety and depressed mood: Secondary | ICD-10-CM | POA: Diagnosis not present

## 2017-11-11 ENCOUNTER — Telehealth: Payer: Self-pay | Admitting: Neurology
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
## 2017-11-11 NOTE — Telephone Encounter (Signed)
Dr. Rexene Alberts- please advise.

## 2017-11-11 NOTE — Telephone Encounter (Signed)
Called daughter back. Relayed recommendations per Dr. Rexene Alberts. Daughter verbalized understanding and appreciation. Her mother has not had any behavioral, motor function changes. If this changes, they will bring her to ER to be evaluated asap. Nothing further needed.

## 2017-11-11 NOTE — Telephone Encounter (Signed)
Microhemorrhages are not traumatic and are chronic changes. However, if there are new changes in patient's behavior or motor function, she needs to be taken to the ER to be checked out urgently. Please have them take her to ER or have them call 911.

## 2017-11-15 ENCOUNTER — Encounter: Payer: Self-pay | Admitting: Family Medicine

## 2017-11-16 NOTE — Telephone Encounter (Signed)
Pts daughter, [name], calling to check status of this message-more specifically to request call back from Dr. Juleen China or her nurse to discuss whether or not the Lexapro could cause mothers restlessness and would like to discuss the dosage/maybe increasing dosage as she states it has been discussed at previous appointments.

## 2017-11-21 ENCOUNTER — Other Ambulatory Visit: Payer: Self-pay | Admitting: Family Medicine

## 2017-11-21 DIAGNOSIS — F4321 Adjustment disorder with depressed mood: Secondary | ICD-10-CM

## 2017-11-21 MED ORDER — ESCITALOPRAM OXALATE 10 MG PO TABS: 10.0000 mg | ORAL_TABLET | Freq: Every day | ORAL | 0 refills | Status: DC

## 2017-11-21 NOTE — Progress Notes (Signed)
ALAISHA [name] is a 77 y.o. female is here for follow up.  History of Present Illness:   HPI:   Patient is here for follow up of anxiety.  She has the following anxiety symptoms: anxious thoughts and quick to cry. Lexapro started at last visit. No side effects. Symptoms have been gradually improving since that time. She denies current suicidal and homicidal ideation. Family history significant for anxiety and depression.Possible organic causes contributing are: none. Risk factors: negative life event death of husband last year and living in ALF without many friends Previous treatment includes none.  She also had a visit with Trey Paula and was told that she didn't need to come back regularly.   Depression screen [facility] 2/9 11/22/2017 09/26/2017 10/19/2016  Decreased Interest 0 0 0  Down, Depressed, Hopeless 0 2 0  PHQ - 2 Score 0 2 0  Altered sleeping 1 0 -  Tired, decreased energy 0 0 -  Change in appetite 0 0 -  Feeling bad or failure about yourself  0 0 -  Trouble concentrating 1 2 -  Moving slowly or fidgety/restless 0 0 -  Suicidal thoughts 0 0 -  PHQ-9 Score 2 4 -  Difficult doing work/chores - Not difficult at all -   GAD 7 : Generalized Anxiety Score 11/22/2017 09/26/2017  Nervous, Anxious, on Edge 2 3  Control/stop worrying 2 3  Worry too much - different things 2 3  Trouble relaxing 2 3  Restless 0 2  Easily annoyed or irritable 1 1  Afraid - awful might happen 0 0  Total GAD 7 Score 9 15  Anxiety Difficulty Not difficult at all Not difficult at all   Health Maintenance Due  Topic Date Due  . INFLUENZA VACCINE  09/08/2017   Depression screen [facility] 2/9 11/22/2017 09/26/2017 10/19/2016  Decreased Interest 0 0 0  Down, Depressed, Hopeless 0 2 0  PHQ - 2 Score 0 2 0  Altered sleeping 1 0 -  Tired, decreased energy 0 0 -  Change in appetite 0 0 -  Feeling bad or failure about yourself  0 0 -  Trouble concentrating 1 2 -  Moving slowly or fidgety/restless 0 0 -    Suicidal thoughts 0 0 -  PHQ-9 Score 2 4 -  Difficult doing work/chores - Not difficult at all -   PMHx, SurgHx, SocialHx, FamHx, Medications, and Allergies were reviewed in the Visit Navigator and updated as appropriate.   Patient Active Problem List   Diagnosis Date Noted  . Pernicious anemia 04/19/2017  . Situational depression 04/27/2016  . Contracture of palmar fascia 07/23/2015  . History of malignant melanoma of skin 02/12/2014  . Cataract 10/23/2013  . Family history of breast cancer 10/23/2013  . Fuchs' corneal dystrophy 10/23/2013  . Glaucoma 10/23/2013  . Hyperlipidemia 03/21/2013  . [place] 12/12/2012   Social History   Tobacco Use  . Smoking status: Never Smoker  . Smokeless tobacco: Never Used  Substance Use Topics  . Alcohol use: Never    Frequency: Never  . Drug use: Never   Current Medications and Allergies:   .  aspirin EC 81 MG tablet, Take 81 mg by mouth daily., Disp: , Rfl:  .  Carboxymethylcellul-Glycerin (REFRESH OPTIVE OP), Apply to eye., Disp: , Rfl:  .  cyanocobalamin (,VITAMIN B-12,) 1000 MCG/ML injection, 1 mL monthly, Disp: 30 mL, Rfl: 1 .  escitalopram (LEXAPRO) 10 MG tablet, Take 1 tablet (10 mg total) by mouth daily.,  Disp: 30 tablet, Rfl: 0 .  LORazepam (ATIVAN) 0.5 MG tablet, 1 by mouth every morning, Disp: 30 tablet, Rfl: 1 .  LUMIGAN 0.01 % SOLN, Place 1 drop into both eyes daily. , Disp: , Rfl:  .  NEEDLE, DISP, 27 G (BD ECLIPSE NEEDLE) 27G X 1/2" MISC, 1 mL by Does not apply route every 30 (thirty) days., Disp: 50 each, Rfl: 1 .  pravastatin (PRAVACHOL) 10 MG tablet, Take 1 tablet (10 mg total) by mouth daily., Disp: 100 tablet, Rfl: 4   Allergies  Allergen Reactions  . Sulfonamide Derivatives Other (See Comments)    Doesn't remember   Review of Systems   Pertinent items are noted in the HPI. Otherwise, ROS is negative.  Vitals:   Vitals:   11/22/17 1411  BP: 98/62  Pulse: 82  Temp: 98.1 F (36.7 C)  TempSrc: Oral   SpO2: 95%  Weight: 112 lb 6.4 oz (51 kg)  Height: 5\' 3"  (1.6 m)     Body mass index is 19.91 kg/m.  Physical Exam:   Physical Exam  Constitutional: She appears well-nourished.  HENT:  Head: Normocephalic and atraumatic.  Eyes: Pupils are equal, round, and reactive to light. EOM are normal.  Neck: Normal range of motion. Neck supple.  Cardiovascular: Normal rate, regular rhythm, normal heart sounds and intact distal pulses.  Pulmonary/Chest: Effort normal.  Abdominal: Soft.  Skin: Skin is warm.  Psychiatric: She has a normal mood and affect. Her behavior is normal.  Nursing note and vitals reviewed.  Assessment and Plan:   Archana was seen today for anxiety.  Diagnoses and all orders for this visit:  Situational anxiety Comments: Increase Lexapro to 20 mg daily. Follow up in 3 months.  Orders: -     escitalopram (LEXAPRO) 20 MG tablet; Take 1 tablet (20 mg total) by mouth daily.  Situational depression  Dementia without behavioral disturbance, unspecified dementia type ([place])   . Reviewed expectations re: course of current medical issues. . Discussed self-management of symptoms. . Outlined signs and symptoms indicating need for more acute intervention. . Patient verbalized understanding and all questions were answered. Marland Kitchen Health Maintenance issues including appropriate healthy diet, exercise, and smoking avoidance were discussed with patient. . See orders for this visit as documented in the electronic medical record. . Patient received an After Visit Summary.   Briscoe Deutscher, DO [place], [facility] 11/23/2017

## 2017-11-21 NOTE — Telephone Encounter (Signed)
Copied from [place] [id number]. Topic: Quick Communication - Rx Refill/Question >> Nov 21, 2017  1:35 PM Bea Graff, NT wrote: Medication: escitalopram (LEXAPRO) 10 MG tablet  Has the patient contacted their pharmacy? Yes.   (Agent: If no, request that the patient contact the pharmacy for the refill.) (Agent: If yes, when and what did the pharmacy advise?)  Preferred Pharmacy (with phone number or street name): [place], [place] - [address] [phone] (Phone) [phone] (Fax)    Agent: Please be advised that RX refills may take up to 3 business days. We ask that you follow-up with your pharmacy.

## 2017-11-21 NOTE — Telephone Encounter (Signed)
See note

## 2017-11-21 NOTE — Telephone Encounter (Signed)
Prescription sent to the pharmacy.

## 2017-11-22 ENCOUNTER — Ambulatory Visit: Payer: Medicare Other | Admitting: Psychology

## 2017-11-22 ENCOUNTER — Encounter: Payer: Self-pay | Admitting: Family Medicine

## 2017-11-22 ENCOUNTER — Ambulatory Visit (INDEPENDENT_AMBULATORY_CARE_PROVIDER_SITE_OTHER): Payer: Medicare Other | Admitting: Family Medicine

## 2017-11-22 VITALS — BP 98/62 | HR 82 | Temp 98.1°F | Ht 63.0 in | Wt 112.4 lb

## 2017-11-22 DIAGNOSIS — F4321 Adjustment disorder with depressed mood: Secondary | ICD-10-CM | POA: Diagnosis not present

## 2017-11-22 DIAGNOSIS — F0390 Unspecified dementia without behavioral disturbance: Secondary | ICD-10-CM | POA: Diagnosis not present

## 2017-11-22 DIAGNOSIS — F418 Other specified anxiety disorders: Secondary | ICD-10-CM

## 2017-11-22 MED ORDER — ESCITALOPRAM OXALATE 20 MG PO TABS: 20.0000 mg | ORAL_TABLET | Freq: Every day | ORAL | 2 refills | Status: DC

## 2017-11-23 ENCOUNTER — Encounter: Payer: Self-pay | Admitting: Family Medicine

## 2017-12-06 ENCOUNTER — Ambulatory Visit: Payer: Medicare Other | Admitting: Psychology

## 2017-12-06 DIAGNOSIS — L814 Other melanin hyperpigmentation: Secondary | ICD-10-CM | POA: Diagnosis not present

## 2017-12-06 DIAGNOSIS — Z85820 Personal history of malignant melanoma of skin: Secondary | ICD-10-CM | POA: Diagnosis not present

## 2017-12-06 DIAGNOSIS — L570 Actinic keratosis: Secondary | ICD-10-CM | POA: Diagnosis not present

## 2017-12-06 DIAGNOSIS — Z85828 Personal history of other malignant neoplasm of skin: Secondary | ICD-10-CM | POA: Diagnosis not present

## 2017-12-06 DIAGNOSIS — L821 Other seborrheic keratosis: Secondary | ICD-10-CM | POA: Diagnosis not present

## 2017-12-06 DIAGNOSIS — D225 Melanocytic nevi of trunk: Secondary | ICD-10-CM | POA: Diagnosis not present

## 2017-12-25 DIAGNOSIS — Z23 Encounter for immunization: Secondary | ICD-10-CM | POA: Diagnosis not present

## 2017-12-27 ENCOUNTER — Ambulatory Visit: Payer: Medicare Other | Admitting: Family Medicine

## 2017-12-28 ENCOUNTER — Telehealth: Payer: Self-pay | Admitting: Family Medicine

## 2017-12-28 DIAGNOSIS — F418 Other specified anxiety disorders: Secondary | ICD-10-CM

## 2017-12-28 NOTE — Telephone Encounter (Signed)
Copied from [place] [id number]. Topic: Quick Communication - Rx Refill/Question >> Dec 28, 2017 11:20 AM Leward Quan A wrote: Medication: LORazepam (ATIVAN) 0.5 MG tablet   Has the patient contacted their pharmacy? Yes.   (Agent: If no, request that the patient contact the pharmacy for the refill.) (Agent: If yes, when and what did the pharmacy advise?)  Preferred Pharmacy (with phone number or street name): [place], [place] - [address] [phone] (Phone) [phone] (Fax)    Agent: Please be advised that RX refills may take up to 3 business days. We ask that you follow-up with your pharmacy.

## 2017-12-29 MED ORDER — LORAZEPAM 0.5 MG PO TABS: ORAL_TABLET | ORAL | 1 refills | Status: DC

## 2017-12-29 NOTE — Telephone Encounter (Signed)
Requested medication (s) are due for refill today: yes  Requested medication (s) are on the active medication list: yes    Last refill: 09/26/17  #30  1 refill  Future visit scheduled yes  03/07/2018  Dr. Juleen China  Notes to clinic:not delegated  Requested Prescriptions  Pending Prescriptions Disp Refills   LORazepam (ATIVAN) 0.5 MG tablet 30 tablet 1    Sig: 1 by mouth every morning     Not Delegated - Psychiatry:  Anxiolytics/Hypnotics Failed - 12/29/2017  7:12 AM      Failed - This refill cannot be delegated      Failed - Urine Drug Screen completed in last 360 days.      Passed - Valid encounter within last 6 months    Recent Outpatient Visits          1 month ago Situational anxiety   [place], [place]   3 months ago Situational depression   [place], [place]   7 months ago Hyperlipidemia, unspecified hyperlipidemia type   [facility] at [place], [place]   8 months ago Pernicious anemia   Therapist, music at [place], [place]   8 months ago Hyperlipidemia, unspecified hyperlipidemia type   Therapist, music at [place], [place]      Future Appointments            In 2 months Briscoe Deutscher, [place], [facility]

## 2017-12-29 NOTE — Telephone Encounter (Signed)
Last o/v 11/22/17 Script 09/26/17 F/u 03/07/18

## 2018-01-02 ENCOUNTER — Telehealth: Payer: Self-pay | Admitting: Family Medicine

## 2018-01-02 DIAGNOSIS — F418 Other specified anxiety disorders: Secondary | ICD-10-CM

## 2018-01-02 NOTE — Telephone Encounter (Signed)
Pts daughter calling and states the pharmacy did not receive the refill for LORazepam (ATIVAN) 0.5 MG tablet  And would like to see if it can be sent back in.

## 2018-01-03 NOTE — Telephone Encounter (Signed)
Patients daughter [name] called to say that mom is all out of LORazepam (ATIVAN) 0.5 MG tablet. Per Shanon Brow Pharmacist at [facility] the Rx was not received. They are willing to take a verbal refill order. Please address and call daughter [name] when done. Ph# [phone]

## 2018-01-03 NOTE — Telephone Encounter (Signed)
Called in verbal and let daughter know

## 2018-01-03 NOTE — Telephone Encounter (Signed)
See TE from 01-03-2018.  Office is handling this request

## 2018-01-03 NOTE — Telephone Encounter (Signed)
See note

## 2018-01-03 NOTE — Telephone Encounter (Signed)
Copied from [place] [id number]. Topic: Quick Communication - Rx Refill/Question >> Dec 28, 2017 11:20 AM Leward Quan A wrote: Medication: LORazepam (ATIVAN) 0.5 MG tablet   Has the patient contacted their pharmacy? Yes  (Agent: If no, request that the patient contact the pharmacy for the refill.) (Agent: If yes, when and what did the pharmacy advise?)  Preferred Pharmacy (with phone number or street name): [facility], [place] - [address] [phone] (Phone) [phone] (Fax)    Agent: Please be advised that RX refills may take up to 3 business days. We ask that you follow-up with your pharmacy.

## 2018-02-20 ENCOUNTER — Other Ambulatory Visit: Payer: Self-pay | Admitting: Family Medicine

## 2018-02-20 DIAGNOSIS — F418 Other specified anxiety disorders: Secondary | ICD-10-CM

## 2018-03-06 NOTE — Progress Notes (Signed)
Carmen [name] is a 78 y.o. female is here for follow up.  History of Present Illness:   Carmen [name], [name] acting as scribe for Dr. Briscoe Deutscher.   HPI: Doing well. Lexapro has been very helpful. Wants to wean Ativan. No falls. Sleeping well. PHQ9 reviewed.   Health Maintenance Due  Topic Date Due  . INFLUENZA VACCINE  09/08/2017   Depression screen [name] 2/9 03/07/2018 11/22/2017 09/26/2017  Decreased Interest 0 0 0  Down, Depressed, Hopeless 0 0 2  PHQ - 2 Score 0 0 2  Altered sleeping 0 1 0  Tired, decreased energy 0 0 0  Change in appetite 0 0 0  Feeling bad or failure about yourself  0 0 0  Trouble concentrating 1 1 2   Moving slowly or fidgety/restless 0 0 0  Suicidal thoughts 0 0 0  PHQ-9 Score 1 2 4   Difficult doing work/chores Not difficult at all - Not difficult at all   PMHx, SurgHx, SocialHx, FamHx, Medications, and Allergies were reviewed in the Visit Navigator and updated as appropriate.   Patient Active Problem List   Diagnosis Date Noted  . Situational anxiety 03/07/2018  . Pernicious anemia 04/19/2017  . Situational depression 04/27/2016  . Contracture of palmar fascia 07/23/2015  . History of malignant melanoma of skin 02/12/2014  . Cataract 10/23/2013  . Family history of breast cancer 10/23/2013  . Fuchs' corneal dystrophy 10/23/2013  . Glaucoma 10/23/2013  . Hyperlipidemia 03/21/2013  . Rh[place]ides 12/12/2012   Social History   Tobacco Use  . Smoking status: Never Smoker  . Smokeless tobacco: Never Used  Substance Use Topics  . Alcohol use: Never    Frequency: Never  . Drug use: Never   Current Medications and Allergies   .  aspirin EC 81 MG tablet, Take 81 mg by mouth daily., Disp: , Rfl:  .  Carboxymethylcellul-Glycerin (REFRESH OPTIVE OP), Apply to eye., Disp: , Rfl:  .  cyanocobalamin (,VITAMIN B-12,) 1000 MCG/ML injection, 1 mL monthly, Disp: 30 mL, Rfl: 1 .  escitalopram (LEXAPRO) 10 MG tablet, Take 1 tablet (10 mg total) by mouth  daily., Disp: 30 tablet, Rfl: 0 .  escitalopram (LEXAPRO) 20 MG tablet, TAKE ONE TABLET DAILY, Disp: 30 tablet, Rfl: 2 .  LORazepam (ATIVAN) 0.5 MG tablet, 1 by mouth every morning, Disp: 30 tablet, Rfl: 1 .  LUMIGAN 0.01 % SOLN, Place 1 drop into both eyes daily. , Disp: , Rfl:  .  NEEDLE, DISP, 27 G (BD ECLIPSE NEEDLE) 27G X 1/2" MISC, 1 mL by Does not apply route every 30 (thirty) days., Disp: 50 each, Rfl: 1 .  pravastatin (PRAVACHOL) 10 MG tablet, Take 1 tablet (10 mg total) by mouth daily., Disp: 100 tablet, Rfl: 4   Allergies  Allergen Reactions  . Sulfonamide Derivatives Other (See Comments)    Doesn't remember   Review of Systems   Pertinent items are noted in the HPI. Otherwise, a complete ROS is negative.  Vitals   Vitals:   03/07/18 1051  BP: 98/64  Pulse: 72  Temp: 98.4 F (36.9 C)  TempSrc: Oral  SpO2: 96%  Weight: 113 lb (51.3 kg)  Height: 5\' 3"  (1.6 m)     Body mass index is 20.02 kg/m.  Physical Exam   Physical Exam Vitals signs and nursing note reviewed.  HENT:     Head: Normocephalic and atraumatic.  Eyes:     Pupils: Pupils are equal, round, and reactive to light.  Neck:  Musculoskeletal: Normal range of motion and neck supple.  Cardiovascular:     Rate and Rhythm: Normal rate and regular rhythm.     Heart sounds: Normal heart sounds.  Pulmonary:     Effort: Pulmonary effort is normal.  Abdominal:     Palpations: Abdomen is soft.  Skin:    General: Skin is warm.  Psychiatric:        Behavior: Behavior normal.     Results for orders placed or performed in visit on 05/17/17  Lipid panel  Result Value Ref Range   Cholesterol 182 0 - 200 mg/dL   Triglycerides 58.0 0.0 - 149.0 mg/dL   HDL 69.40 >39.00 mg/dL   VLDL 11.6 0.0 - 40.0 mg/dL   LDL Cholesterol 101 (H) 0 - 99 mg/dL   Total CHOL/HDL Ratio 3    NonHDL 112.77   Hepatic function panel  Result Value Ref Range   Total Bilirubin 0.4 0.2 - 1.2 mg/dL   Bilirubin, Direct 0.1 0.0 -  0.3 mg/dL   Alkaline Phosphatase 62 39 - 117 U/L   AST 20 0 - 37 U/L   ALT 18 0 - 35 U/L   Total Protein 6.7 6.0 - 8.3 g/dL   Albumin 4.3 3.5 - 5.2 g/dL    Assessment and Plan   [name] was seen today for follow-up.  Diagnoses and all orders for this visit:  Situational anxiety Comments: Improved. Discussed weaning Ativan and gave protocol. Orders: -     escitalopram (LEXAPRO) 20 MG tablet; Take 1 tablet (20 mg total) by mouth daily. -     LORazepam (ATIVAN) 0.5 MG tablet; 1 by mouth every morning  Situational anxiety -     escitalopram (LEXAPRO) 20 MG tablet; Take 1 tablet (20 mg total) by mouth daily. -     LORazepam (ATIVAN) 0.5 MG tablet; 1 by mouth every morning  Situational depression  Pure hypercholesterolemia -     Comprehensive metabolic panel -     Lipid panel -     pravastatin (PRAVACHOL) 10 MG tablet; Take 1 tablet (10 mg total) by mouth daily.  Pernicious anemia -     CBC with Differential/Platelet -     B12 -     cyanocobalamin (,VITAMIN B-12,) 1000 MCG/ML injection; 1 mL monthly   . Orders and follow up as documented in [place], reviewed diet, exercise and weight control, cardiovascular risk and specific lipid/LDL goals reviewed, reviewed medications and side effects in detail.  . Reviewed expectations re: course of current medical issues. . Outlined signs and symptoms indicating need for more acute intervention. . Patient verbalized understanding and all questions were answered. . Patient received an After Visit Summary.  [name] served as Education administrator during this visit. History, Physical, and Plan performed by medical provider. The above documentation has been reviewed and is accurate and complete. Briscoe Deutscher, D.O.  Briscoe Deutscher, DO [place], [name] 03/07/2018

## 2018-03-07 ENCOUNTER — Ambulatory Visit (INDEPENDENT_AMBULATORY_CARE_PROVIDER_SITE_OTHER): Payer: Medicare Other | Admitting: Family Medicine

## 2018-03-07 ENCOUNTER — Encounter: Payer: Self-pay | Admitting: Family Medicine

## 2018-03-07 VITALS — BP 98/64 | HR 72 | Temp 98.4°F | Ht 63.0 in | Wt 113.0 lb

## 2018-03-07 DIAGNOSIS — F418 Other specified anxiety disorders: Secondary | ICD-10-CM | POA: Diagnosis not present

## 2018-03-07 DIAGNOSIS — E7800 Pure hypercholesterolemia, unspecified: Secondary | ICD-10-CM | POA: Diagnosis not present

## 2018-03-07 DIAGNOSIS — F4321 Adjustment disorder with depressed mood: Secondary | ICD-10-CM

## 2018-03-07 DIAGNOSIS — D510 Vitamin B12 deficiency anemia due to intrinsic factor deficiency: Secondary | ICD-10-CM | POA: Diagnosis not present

## 2018-03-07 LAB — LIPID PANEL
Cholesterol: 194 mg/dL (ref 0–200)
HDL: 67.9 mg/dL (ref >39.00)
LDL Cholesterol: 109 mg/dL — ABNORMAL HIGH (ref 0–99)
NonHDL: 126.3
Total CHOL/HDL Ratio: 3
Triglycerides: 89 mg/dL (ref 0.0–149.0)
VLDL: 17.8 mg/dL (ref 0.0–40.0)

## 2018-03-07 LAB — CBC WITH DIFFERENTIAL/PLATELET
Basophils Absolute: 0 10*3/uL (ref 0.0–0.1)
Basophils Relative: 0.9 % (ref 0.0–3.0)
Eosinophils Absolute: 0.1 10*3/uL (ref 0.0–0.7)
Eosinophils Relative: 2.7 % (ref 0.0–5.0)
HCT: 41 % (ref 36.0–46.0)
Hemoglobin: 13.3 g/dL (ref 12.0–15.0)
Lymphocytes Relative: 17.3 % (ref 12.0–46.0)
Lymphs Abs: 0.9 10*3/uL (ref 0.7–4.0)
MCHC: 32.4 g/dL (ref 30.0–36.0)
MCV: 87.3 fl (ref 78.0–100.0)
Monocytes Absolute: 0.4 10*3/uL (ref 0.1–1.0)
Monocytes Relative: 8.6 % (ref 3.0–12.0)
Neutro Abs: 3.5 10*3/uL (ref 1.4–7.7)
Neutrophils Relative %: 70.5 % (ref 43.0–77.0)
Platelets: 220 10*3/uL (ref 150.0–400.0)
RBC: 4.69 Mil/uL (ref 3.87–5.11)
RDW: 13.9 % (ref 11.5–15.5)
WBC: 5 10*3/uL (ref 4.0–10.5)

## 2018-03-07 LAB — COMPREHENSIVE METABOLIC PANEL
ALT: 19 U/L (ref 0–35)
AST: 22 U/L (ref 0–37)
Albumin: 4.2 g/dL (ref 3.5–5.2)
Alkaline Phosphatase: 63 U/L (ref 39–117)
BUN: 20 mg/dL (ref 6–23)
CO2: 33 mEq/L — ABNORMAL HIGH (ref 19–32)
Calcium: 9.4 mg/dL (ref 8.4–10.5)
Chloride: 104 mEq/L (ref 96–112)
Creatinine, Ser: 0.77 mg/dL (ref 0.40–1.20)
GFR: 72.78 mL/min (ref >60.00)
Glucose, Bld: 92 mg/dL (ref 70–99)
Potassium: 4.1 mEq/L (ref 3.5–5.1)
Sodium: 141 mEq/L (ref 135–145)
Total Bilirubin: 0.4 mg/dL (ref 0.2–1.2)
Total Protein: 6.3 g/dL (ref 6.0–8.3)

## 2018-03-07 LAB — VITAMIN B12: Vitamin B-12: 558 pg/mL (ref 211–911)

## 2018-03-07 MED ORDER — LORAZEPAM 0.5 MG PO TABS: ORAL_TABLET | ORAL | 1 refills | Status: DC

## 2018-03-07 MED ORDER — PRAVASTATIN SODIUM 10 MG PO TABS: 10.0000 mg | ORAL_TABLET | Freq: Every day | ORAL | 3 refills | Status: DC

## 2018-03-07 MED ORDER — CYANOCOBALAMIN 1000 MCG/ML IJ SOLN: INTRAMUSCULAR | 1 refills | Status: DC

## 2018-03-07 MED ORDER — ESCITALOPRAM OXALATE 20 MG PO TABS: 20.0000 mg | ORAL_TABLET | Freq: Every day | ORAL | 3 refills | Status: DC

## 2018-03-31 DIAGNOSIS — H401132 Primary open-angle glaucoma, bilateral, moderate stage: Secondary | ICD-10-CM | POA: Diagnosis not present

## 2018-03-31 DIAGNOSIS — Z961 Presence of intraocular lens: Secondary | ICD-10-CM | POA: Diagnosis not present

## 2018-03-31 DIAGNOSIS — H524 Presbyopia: Secondary | ICD-10-CM | POA: Diagnosis not present

## 2018-04-03 ENCOUNTER — Encounter: Payer: Self-pay | Admitting: Neurology

## 2018-04-05 ENCOUNTER — Encounter: Payer: Self-pay | Admitting: Neurology

## 2018-04-05 ENCOUNTER — Ambulatory Visit (INDEPENDENT_AMBULATORY_CARE_PROVIDER_SITE_OTHER): Payer: Medicare Other | Admitting: Neurology

## 2018-04-05 VITALS — BP 143/85 | HR 80 | Ht 63.0 in | Wt 116.0 lb

## 2018-04-05 DIAGNOSIS — Z789 Other specified health status: Secondary | ICD-10-CM

## 2018-04-05 DIAGNOSIS — Z9989 Dependence on other enabling machines and devices: Secondary | ICD-10-CM | POA: Diagnosis not present

## 2018-04-05 DIAGNOSIS — R9089 Other abnormal findings on diagnostic imaging of central nervous system: Secondary | ICD-10-CM

## 2018-04-05 DIAGNOSIS — I680 Cerebral amyloid angiopathy: Secondary | ICD-10-CM | POA: Diagnosis not present

## 2018-04-05 DIAGNOSIS — G4733 Obstructive sleep apnea (adult) (pediatric): Secondary | ICD-10-CM | POA: Diagnosis not present
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
## 2018-04-05 NOTE — Patient Instructions (Signed)
I appreciate you trying CPAP, as your initial treatment option for obstructive sleep apnea. As discussed, your sleep study results from 05/18/17 indicated moderate obstructive sleep apnea by number of events. Unfortunately, you have struggled with CPAP. We can look into trying an oral appliance. I would like to make a referral to Dr. Toy Cookey at this time for you to be considered for a custom-made oral appliance for treatment of obstructive sleep apnea.  We will send my records, and your sleep study results to her office and you should hear from her office directly. I will see you back in one year, in case your decide to stay on CPAP therapy. Please get in touch with your DME company, [employer], about returning your CPAP machine, vs. Purchasing the CPAP from them.

## 2018-04-25 ENCOUNTER — Other Ambulatory Visit: Payer: Self-pay | Admitting: Family Medicine

## 2018-04-25 DIAGNOSIS — F418 Other specified anxiety disorders: Secondary | ICD-10-CM

## 2018-04-25 NOTE — Telephone Encounter (Signed)
Last OV 03/07/2018 Last refill 03/07/2018 #30/1 Next OV 09/05/2018

## 2018-05-08 ENCOUNTER — Telehealth: Payer: Self-pay | Admitting: Family Medicine

## 2018-05-08 NOTE — Telephone Encounter (Signed)
Copied from [place] [id number]. Topic: Quick Communication - See Telephone Encounter >> May 08, 2018  7:10 AM Rayann Heman wrote: Mliss Sax calling from [facility] left a Advertising account executive. She states that she faxed over a request for speech therapy evaluation. She would like this faxed back over to them. Please advise  [phone]

## 2018-05-08 NOTE — Telephone Encounter (Signed)
See note

## 2018-05-09 NOTE — Telephone Encounter (Signed)
Okay for orders? 

## 2018-05-09 NOTE — Telephone Encounter (Signed)
Have faxed order.

## 2018-05-10 NOTE — Telephone Encounter (Signed)
Patient's daughter, [name] called and said she just spoke with [name] at [facility] and they are advising her they still do not have the orders. Katharine Look can be reached at [phone]

## 2018-05-10 NOTE — Telephone Encounter (Signed)
See note

## 2018-05-10 NOTE — Telephone Encounter (Signed)
Given to front office to fax yesterday. We have had multiple issues with them in the past "not getting our faxes." Please assist [place].

## 2018-05-10 NOTE — Telephone Encounter (Signed)
I just re-faxed the document to [facility]. No further action required.

## 2018-06-06 ENCOUNTER — Telehealth: Payer: Self-pay | Admitting: Family Medicine

## 2018-06-06 NOTE — Telephone Encounter (Signed)
Order faxed to [facility].

## 2018-06-06 NOTE — Telephone Encounter (Signed)
Copied from [place] [phone]. Topic: Quick Communication - See Telephone Encounter >> Jun 06, 2018 10:37 AM Nils Flack wrote: CRM for notification. See Telephone encounter for: daughter normally gives pt her monthly b12 shot, but she is not able to go see her in the nursing home right now.  The facility can give b12 injection if they have an order. Needs order for monthly b12 shot faxed to - Mliss Sax (clinic nurse) fax [phone] Cb for daughter is - [phone]

## 2018-06-06 NOTE — Telephone Encounter (Signed)
See note

## 2018-06-06 NOTE — Telephone Encounter (Signed)
Pt's returning call for [facility].

## 2018-06-06 NOTE — Telephone Encounter (Signed)
Called and spoke with daughter, [name]. Pt received her lab B12 injection 04/14/2018.

## 2018-06-06 NOTE — Telephone Encounter (Signed)
Want to confirm last date of B12 inj. Called daughter and left VM to call the office.

## 2018-07-17 ENCOUNTER — Other Ambulatory Visit: Payer: Self-pay | Admitting: Family Medicine

## 2018-07-17 DIAGNOSIS — F418 Other specified anxiety disorders: Secondary | ICD-10-CM

## 2018-07-17 NOTE — Telephone Encounter (Signed)
Last OV 03/07/18 Last refill 04/25/18 #30/1 Next OV 09/05/18

## 2018-08-22 ENCOUNTER — Other Ambulatory Visit: Payer: Self-pay | Admitting: Family Medicine

## 2018-08-22 DIAGNOSIS — F418 Other specified anxiety disorders: Secondary | ICD-10-CM

## 2018-08-22 NOTE — Telephone Encounter (Signed)
Pt requesting refill on Lorazepam 0.5 mg, Last OV 02/27/18, Last Rx 07/18/2018, # 30, refill 0. Pt has appt scheduled for 09/05/18 with you.

## 2018-09-05 ENCOUNTER — Encounter: Payer: Self-pay | Admitting: Family Medicine

## 2018-09-05 ENCOUNTER — Ambulatory Visit (INDEPENDENT_AMBULATORY_CARE_PROVIDER_SITE_OTHER): Payer: Medicare Other | Admitting: Family Medicine

## 2018-09-05 ENCOUNTER — Other Ambulatory Visit: Payer: Self-pay

## 2018-09-05 VITALS — BP 140/68 | HR 97 | Temp 98.6°F | Ht 63.0 in | Wt 115.6 lb

## 2018-09-05 DIAGNOSIS — F4321 Adjustment disorder with depressed mood: Secondary | ICD-10-CM

## 2018-09-05 DIAGNOSIS — F418 Other specified anxiety disorders: Secondary | ICD-10-CM

## 2018-09-05 NOTE — Progress Notes (Signed)
Carmen [name] is a 78 y.o. female is here for follow up.  History of Present Illness:   Carmen [name], [name] acting as scribe for Dr. Briscoe Deutscher.   HPI: Doing well. Lexapro has been very helpful. No falls. Sleeping well. PHQ9 reviewed.   There are no preventive care reminders to display for this patient.   Depression screen [facility] 2/9 09/05/2018 03/07/2018 11/22/2017  Decreased Interest 0 0 0  Down, Depressed, Hopeless 0 0 0  PHQ - 2 Score 0 0 0  Altered sleeping 0 0 1  Tired, decreased energy 0 0 0  Change in appetite 0 0 0  Feeling bad or failure about yourself  0 0 0  Trouble concentrating 0 1 1  Moving slowly or fidgety/restless 0 0 0  Suicidal thoughts 0 0 0  PHQ-9 Score 0 1 2  Difficult doing work/chores Not difficult at all Not difficult at all -   PMHx, SurgHx, SocialHx, FamHx, Medications, and Allergies were reviewed in the Visit Navigator and updated as appropriate.   Patient Active Problem List   Diagnosis Date Noted  . Situational anxiety 03/07/2018  . Pernicious anemia 04/19/2017  . Situational depression 04/27/2016  . Contracture of palmar fascia 07/23/2015  . History of malignant melanoma of skin 02/12/2014  . Cataract 10/23/2013  . Family history of breast cancer 10/23/2013  . Fuchs' corneal dystrophy 10/23/2013  . Glaucoma 10/23/2013  . Hyperlipidemia 03/21/2013  . [place] 12/12/2012   Social History   Tobacco Use  . Smoking status: Never Smoker  . Smokeless tobacco: Never Used  Substance Use Topics  . Alcohol use: Never    Frequency: Never  . Drug use: Never   Current Medications and Allergies   .  aspirin EC 81 MG tablet, Take 81 mg by mouth daily., Disp: , Rfl:  .  cyanocobalamin (,VITAMIN B-12,) 1000 MCG/ML injection, 1 mL monthly, Disp: 30 mL, Rfl: 1 .  escitalopram (LEXAPRO) 20 MG tablet, Take 1 tablet (20 mg total) by mouth daily., Disp: 90 tablet, Rfl: 3 .  LORazepam (ATIVAN) 0.5 MG tablet, TAKE ONE TABLET EVERY MORNING, Disp: 30  tablet, Rfl: 1 .  LUMIGAN 0.01 % SOLN, Place 1 drop into both eyes daily. , Disp: , Rfl:  .  NEEDLE, DISP, 27 G (BD ECLIPSE NEEDLE) 27G X 1/2" MISC, 1 mL by Does not apply route every 30 (thirty) days., Disp: 50 each, Rfl: 1 .  pravastatin (PRAVACHOL) 10 MG tablet, Take 1 tablet (10 mg total) by mouth daily., Disp: 90 tablet, Rfl: 3   Allergies  Allergen Reactions  . Sulfonamide Derivatives Other (See Comments)    Doesn't remember   Review of Systems   Pertinent items are noted in the HPI. Otherwise, a complete ROS is negative.  Vitals   Vitals:   09/05/18 0953  BP: 140/68  Pulse: 97  Temp: 98.6 F (37 C)  TempSrc: Temporal  SpO2: 97%  Weight: 115 lb 9.6 oz (52.4 kg)  Height: 5\' 3"  (1.6 m)     Body mass index is 20.48 kg/m.  Physical Exam   Physical Exam Vitals signs and nursing note reviewed.  HENT:     Head: Normocephalic and atraumatic.  Eyes:     Pupils: Pupils are equal, round, and reactive to light.  Neck:     Musculoskeletal: Normal range of motion and neck supple.  Cardiovascular:     Rate and Rhythm: Normal rate and regular rhythm.     Heart sounds: Normal  heart sounds.  Pulmonary:     Effort: Pulmonary effort is normal.  Abdominal:     Palpations: Abdomen is soft.  Skin:    General: Skin is warm.  Psychiatric:        Behavior: Behavior normal.    Assessment and Plan   [name] was seen today for follow-up.  Diagnoses and all orders for this visit:  Situational anxiety Comments: Improved.  Situational depression Comments: Improving. Reviewed safe ways to get social time.   . Orders and follow up as documented in [place], reviewed diet, exercise and weight control, cardiovascular risk and specific lipid/LDL goals reviewed, reviewed medications and side effects in detail.  . Reviewed expectations re: course of current medical issues. . Outlined signs and symptoms indicating need for more acute intervention. . Patient verbalized  understanding and all questions were answered. . Patient received an After Visit Summary.  [name] served as Education administrator during this visit. History, Physical, and Plan performed by medical provider. The above documentation has been reviewed and is accurate and complete. Briscoe Deutscher, D.O.  Briscoe Deutscher, DO [place], [facility] - Closed 09/05/2018

## 2018-10-23 DIAGNOSIS — H1851 Endothelial corneal dystrophy: Secondary | ICD-10-CM | POA: Diagnosis not present

## 2018-10-23 DIAGNOSIS — H401132 Primary open-angle glaucoma, bilateral, moderate stage: Secondary | ICD-10-CM | POA: Diagnosis not present

## 2018-10-31 ENCOUNTER — Other Ambulatory Visit: Payer: Self-pay | Admitting: Family Medicine

## 2018-10-31 DIAGNOSIS — F418 Other specified anxiety disorders: Secondary | ICD-10-CM

## 2018-10-31 NOTE — Telephone Encounter (Signed)
Last fill 08/22/18  #30/1 Last OV 09/05/18

## 2018-11-17 DIAGNOSIS — Z803 Family history of malignant neoplasm of breast: Secondary | ICD-10-CM | POA: Diagnosis not present

## 2018-11-17 DIAGNOSIS — Z1231 Encounter for screening mammogram for malignant neoplasm of breast: Secondary | ICD-10-CM | POA: Diagnosis not present

## 2018-11-17 LAB — HM MAMMOGRAPHY

## 2018-11-27 DIAGNOSIS — Z85828 Personal history of other malignant neoplasm of skin: Secondary | ICD-10-CM | POA: Diagnosis not present

## 2018-11-27 DIAGNOSIS — D692 Other nonthrombocytopenic purpura: Secondary | ICD-10-CM | POA: Diagnosis not present

## 2018-11-27 DIAGNOSIS — Z85820 Personal history of malignant melanoma of skin: Secondary | ICD-10-CM | POA: Diagnosis not present

## 2018-11-27 DIAGNOSIS — D1801 Hemangioma of skin and subcutaneous tissue: Secondary | ICD-10-CM | POA: Diagnosis not present

## 2018-11-27 DIAGNOSIS — L821 Other seborrheic keratosis: Secondary | ICD-10-CM | POA: Diagnosis not present

## 2018-11-27 DIAGNOSIS — L905 Scar conditions and fibrosis of skin: Secondary | ICD-10-CM | POA: Diagnosis not present

## 2018-12-05 ENCOUNTER — Encounter: Payer: Self-pay | Admitting: Internal Medicine

## 2018-12-06 ENCOUNTER — Non-Acute Institutional Stay: Payer: Medicare Other | Admitting: Internal Medicine

## 2018-12-06 ENCOUNTER — Encounter: Payer: Self-pay | Admitting: Internal Medicine

## 2018-12-06 ENCOUNTER — Other Ambulatory Visit: Payer: Self-pay

## 2018-12-06 VITALS — BP 144/86 | HR 85 | Temp 98.3°F | Ht 63.0 in | Wt 122.0 lb

## 2018-12-06 DIAGNOSIS — Z803 Family history of malignant neoplasm of breast: Secondary | ICD-10-CM

## 2018-12-06 DIAGNOSIS — Z7189 Other specified counseling: Secondary | ICD-10-CM | POA: Diagnosis not present

## 2018-12-06 DIAGNOSIS — E7800 Pure hypercholesterolemia, unspecified: Secondary | ICD-10-CM | POA: Diagnosis not present

## 2018-12-06 DIAGNOSIS — D510 Vitamin B12 deficiency anemia due to intrinsic factor deficiency: Secondary | ICD-10-CM | POA: Diagnosis not present

## 2018-12-06 DIAGNOSIS — H18519 Endothelial corneal dystrophy, unspecified eye: Secondary | ICD-10-CM

## 2018-12-06 DIAGNOSIS — F4321 Adjustment disorder with depressed mood: Secondary | ICD-10-CM | POA: Diagnosis not present

## 2018-12-06 DIAGNOSIS — Z85820 Personal history of malignant melanoma of skin: Secondary | ICD-10-CM

## 2018-12-06 DIAGNOSIS — Z66 Do not resuscitate: Secondary | ICD-10-CM

## 2018-12-06 DIAGNOSIS — Z23 Encounter for immunization: Secondary | ICD-10-CM

## 2018-12-06 DIAGNOSIS — H401190 Primary open-angle glaucoma, unspecified eye, stage unspecified: Secondary | ICD-10-CM | POA: Diagnosis not present

## 2018-12-06 DIAGNOSIS — Z9989 Dependence on other enabling machines and devices: Secondary | ICD-10-CM

## 2018-12-06 DIAGNOSIS — I680 Cerebral amyloid angiopathy: Secondary | ICD-10-CM

## 2018-12-06 DIAGNOSIS — F410 Panic disorder [episodic paroxysmal anxiety] without agoraphobia: Secondary | ICD-10-CM

## 2018-12-06 DIAGNOSIS — G4733 Obstructive sleep apnea (adult) (pediatric): Secondary | ICD-10-CM | POA: Diagnosis not present

## 2018-12-06 MED ORDER — SHINGRIX 50 MCG/0.5ML IM SUSR: 0.5000 mL | Freq: Once | INTRAMUSCULAR | 1 refills | Status: AC

## 2018-12-06 NOTE — Progress Notes (Signed)
Provider:  Rexene Edison. [name], D.O., C.M.D. Location:  Occupational psychologist of Service:  Clinic (12)  Previous PCP: Carmen Curry, DO Patient Care Team: Carmen Curry, DO as PCP - General (Geriatric Medicine) [name], Amy, MD as Consulting Physician (Dermatology) Luberta Mutter, MD as Consulting Physician (Ophthalmology) Star Age, MD as Attending Physician (Neurology)  Extended Emergency Contact Information Primary Emergency Contact: Carmen [name] [place] of [place] Phone: [phone] Mobile Phone: [phone] Relation: Daughter Secondary Emergency Contact: Carmen, [name] Mobile Phone: [phone] Relation: Son  Code Status: DNR completed today; discussed MOST for future consideration  Goals of Care: Advanced Directive information Advanced Directives 12/09/2018  Does Patient Have a Medical Advance Directive? Yes  Type of Advance Directive [place]  Does patient want to make changes to medical advance directive? No - Patient declined  Copy of [place] in Chart? No - copy requested    Chief Complaint  Patient presents with  . New Patient (Initial Visit)    New Patient to Establish. No Complaints. Son, [name] with patient. MMSE 18/30 (Failed Clock Drawing)    HPI: Patient is a 78 y.o. [name] seen today to establish with [facility].  Records have been requested from Dr. Juleen [name] (in epic).    She's been at [place] for just over a year.  [place].  When her husband passed away, she stayed at home a little more than a year, but it was very lonesome.  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  She exercises doing the chairfit classes here three times a week.    Pernicious anemia:  Her daughter was giving her the B12 shot monthly.      She had malignant melanoma in the past.  Follows with Dr. Amy [name] who she saw recently.  She and her  son are not sure where it was located.  She has glaucoma and Fuchs corneal dystrophy.  Vision is doing ok.  Does not need regular glasses--just reading.  On lumigan drops.  Sees Dr. Ellie Lunch here in [facility]  Hyperlipidemia:  LDL 109 last check.  On pravachol 46m.  Anxiety/depression:  They've been closed in.  Things are not great, but she's making it through.  They were discussing anxiety (she and her son) before I entered.  She gets a panicky feeling at times.  She takes a lorazepam in the morning.  Her son notes it at night when he speaks with her.  She also takes lexapro 228m  She's bothered by the doors being closed.  She loves people.  She did have some dinner outings.  She says she's not scared or panicky daily.    Memory:  18/30 and failed clock on MMSE today.  She's had cerebral amyloid angiopathy on brain imaging with microhemorrhages originally seen on an eye exam that prompted the evaluation.  Her daughter fills her pillbox and her son manages her finances.  She's had OSA and on CPAP.  She has not had traditional symptoms, but AHI numbers have been quite high around 10-17.    She needs a flu shot.  [place]  [facility]  Discussed shingrix vaccinations.  Says she had shingles once.  She did have chicken pox as a child.  She is willing to get the shingrix series so Rx was sent to  her pharmacy so I get something back when she gets them done.    She had proactive mastectomies due to family history of breast cancer in her mother.  That was pre-BRCA testing.  Had a burst prosthesis in the past, as well, when picking up a dog.  She's probably drinking one glass of wine per day which is a recent increase, it sounds like.  She does this socially at dinner.    In discussing CPR, she does not want to prolong the inevitable.  She would not want to be brought back if she had a cardiopulmonary arrest.  Being this is her first visit,  we completed the DNR form, but I only introduced the MOST form for future completion.  The biggest concern today was about her anxiety and we discussed risks of benzo use and her son is worried that it could be contributing some to her word-finding difficulty (though does have the cerebral amyloid angiopathy, as well) and this could be the case.  They are unsure what she took pre-lexapro, but know she's been on tx since her husband passed away.  Past Medical History:  Diagnosis Date  . Anxiety    Per [facility] New Patient Packet   . Bleb x 4   . Depression    Per [facility] New Patient Packet   . Dermatitis due to solar radiation   . Dyshidrosis 05/03/2007  . Fuchs' corneal dystrophy    Per [facility] New Patient Packet   . Glaucoma   . Heart murmur   . Hyperlipidemia    Per [facility] New Patient Packet   . Malignant melanoma ([place])    Per [facility] New Patient Packet   . Osteopenia   . Pernicious anemia    Per [place] New Patient Packet   . Tinnitus    Past Surgical History:  Procedure Laterality Date  . CORNEAL TRANSPLANT     Dr.[facility], Per [place] New Patient Packet   . MASTECTOMY    . PLACEMENT OF BREAST IMPLANTS      Social History   Socioeconomic History  . Marital status: Married    Spouse name: Not on file  . Number of children: Not on file  . Years of education: Not on file  . Highest education level: Not on file  Occupational History    Employer: [employer]  . Financial resource strain: Not on file  . Food insecurity    Worry: Not on file    Inability: Not on file  . Transportation needs    Medical: Not on file    Non-medical: Not on file  Tobacco Use  . Smoking status: Never Smoker  . Smokeless tobacco: Never Used  Substance and Sexual Activity  . Alcohol use: Yes    Frequency: Never    Comment: 1 drink weekly per [facility] New Patient Packet 12/05/2018   . Drug use: Never  . Sexual activity: Not Currently  Lifestyle  . Physical activity    Days per week: Not on  file    Minutes per session: Not on file  . Stress: Not on file  Relationships  . Social Herbalist on phone: Not on file    Gets together: Not on file    Attends religious service: Not on file    Active member of club or organization: Not on file    Attends meetings of clubs or organizations: Not on file    Relationship status: Not on file  Other Topics Concern  . Not on file  Social History Narrative   Per [facility] New Patient Packet abstracted 12/05/2018:   Diet: Regular       Caffeine: Occasionally       Married, if yes what year: Widow, married 1965      Do you live in a house, apartment, assisted living, [place], trailer, ect: [place] resident at [employer], 1 stories, 1 person      Pets: No      Current/Past profession: Buyer, retail, Marine scientist    Exercise:Yes, 3 x weekly          Living Will: Yes   DNR: Left blank    POA/HPOA: Left blank       Functional Status:   Do you have difficulty bathing or dressing yourself? No   Do you have difficulty preparing food or eating? No   Do you have difficulty managing your medications? No answer selected, notation- pill box filled by daughter   Do you have difficulty managing your finances? Yes, patients son handles her finances    Do you have difficulty affording your medications? No    reports that she has never smoked. She has never used smokeless tobacco. She reports current alcohol use. She reports that she does not use drugs.  Functional Status Survey:    Family History  Problem Relation Age of Onset  . Stroke Other   . Heart disease Other   . Breast cancer Other   . Breast cancer Mother     Health Maintenance  Topic Date Due  . INFLUENZA VACCINE  09/09/2018  . TETANUS/TDAP  10/19/2020  . DEXA SCAN  Completed  . PNA vac Low Risk Adult  Completed    Allergies  Allergen Reactions  . Sulfonamide Derivatives Other (See Comments)    Doesn't remember    Outpatient Encounter Medications as of  12/06/2018  Medication Sig  . aspirin EC 81 MG tablet Take 81 mg by mouth daily.  . cyanocobalamin (,VITAMIN B-12,) 1000 MCG/ML injection 1 mL monthly  . escitalopram (LEXAPRO) 20 MG tablet Take 1 tablet (20 mg total) by mouth daily.  Marland Kitchen LORazepam (ATIVAN) 0.5 MG tablet TAKE ONE TABLET EVERY MORNING  . LUMIGAN 0.01 % SOLN Place 1 drop into both eyes daily.   Marland Kitchen NEEDLE, DISP, 27 G (BD ECLIPSE NEEDLE) 27G X 1/2" MISC 1 mL by Does not apply route every 30 (thirty) days.  . pravastatin (PRAVACHOL) 10 MG tablet Take 1 tablet (10 mg total) by mouth daily.  . [EXPIRED] Zoster Vaccine Adjuvanted [facility]) injection Inject 0.5 mLs into the muscle once for 1 dose.   No facility-administered encounter medications on file as of 12/06/2018.     Review of Systems  Constitutional: Negative for chills and fever.  HENT: Negative for hearing loss and sore throat.   Eyes: Negative for blurred vision.       Glaucoma, fuchs  Respiratory: Negative for cough and shortness of breath.   Cardiovascular: Negative for chest pain, palpitations and leg swelling.  Gastrointestinal: Negative for abdominal pain, blood in stool, constipation, diarrhea, heartburn, melena, nausea and vomiting.  Genitourinary: Negative for dysuria, frequency and urgency.  Musculoskeletal: Negative for falls and joint pain.  Skin: Negative for itching and rash.  Neurological: Negative for dizziness, tingling, sensory change, focal weakness and loss of consciousness.  Endo/Heme/Allergies: Bruises/bleeds easily.  Psychiatric/Behavioral: Positive for depression and memory loss. The patient is nervous/anxious. The patient does not have insomnia.     Vitals:  12/06/18 1016  BP: (!) 144/86  Pulse: 85  Temp: 98.3 F (36.8 C)  TempSrc: Oral  SpO2: 97%  Weight: 122 lb (55.3 kg)  Height: _0  (1.6 m)   Body mass index is 21.61 kg/m. Physical Exam Vitals signs reviewed.  Constitutional:      General: She is not in acute distress.     Appearance: Normal appearance. She is not ill-appearing or toxic-appearing.     Comments: Petite, well-dressed and groomed  HENT:     Head: Normocephalic and atraumatic.     Right Ear: External ear normal.     Left Ear: External ear normal.     Nose:     Comments: Nose and mouth deferred due to no concerns amid covid pandemic masking Eyes:     Extraocular Movements: Extraocular movements intact.     Conjunctiva/sclera: Conjunctivae normal.     Pupils: Pupils are equal, round, and reactive to light.  Neck:     Musculoskeletal: Neck supple.  Cardiovascular:     Rate and Rhythm: Normal rate and regular rhythm.     Pulses: Normal pulses.  Pulmonary:     Effort: Pulmonary effort is normal.     Breath sounds: Normal breath sounds. No wheezing, rhonchi or rales.  Abdominal:     General: Bowel sounds are normal. There is no distension.     Palpations: Abdomen is soft.     Tenderness: There is no abdominal tenderness. There is no guarding or rebound.  Musculoskeletal: Normal range of motion.        General: No swelling or tenderness.     Right lower leg: No edema.     Left lower leg: No edema.  Lymphadenopathy:     Cervical: No cervical adenopathy.  Skin:    General: Skin is warm and dry.     Capillary Refill: Capillary refill takes less than 2 seconds.     Comments: Small yellow ecchymoses of right forehead from striking head on column when getting up in dining area recently  Neurological:     General: No focal deficit present.     Mental Status: She is alert and oriented to person, place, and time. Mental status is at baseline.     Cranial Nerves: No cranial nerve deficit.     Sensory: No sensory deficit.     Motor: No weakness.     Gait: Gait normal.  Psychiatric:        Mood and Affect: Mood normal.        Behavior: Behavior normal.        Judgment: Judgment normal.     Comments: Some word-finding difficulty noted--seems to use words that may not fit so well at times; son did  clarify some history, as well     Labs reviewed: Basic Metabolic Panel: Recent Labs    03/07/18 1114  NA 141  K 4.1  CL 104  CO2 33*  GLUCOSE 92  BUN 20  CREATININE 0.77  CALCIUM 9.4   Liver Function Tests: Recent Labs    03/07/18 1114  AST 22  ALT 19  ALKPHOS 63  BILITOT 0.4  PROT 6.3  ALBUMIN 4.2   No results for input(s): LIPASE, AMYLASE in the last 8760 hours. No results for input(s): AMMONIA in the last 8760 hours. CBC: Recent Labs    03/07/18 1114  WBC 5.0  NEUTROABS 3.5  HGB 13.3  HCT 41.0  MCV 87.3  PLT 220.0   Cardiac Enzymes: No results  for input(s): CKTOTAL, CKMB, CKMBINDEX, TROPONINI in the last 8760 hours. BNP: Invalid input(s): POCBNP No results found for: HGBA1C Lab Results  Component Value Date   TSH 1.77 04/11/2017   Lab Results  Component Value Date   VITAMINB12 558 03/07/2018  Reviewed bone density from 03/04/14 with osteopenia when ca with D was recommended and 30 mins weightbearing--should be repeated as it's been 4 years.  Assessment/Plan 1. Situational depression -this aspect seems controlled with lexapro, BUT, she's still having panic attacks and periods of anxiety -looking thru her med history, she was already on celexa 58m in the past which also did not permit tapering or discontinuing the lorazepam -might consider cymbalta or wellbutrin as alternatives at next visit in hopes of weaning lorazepam very gradually  2. Panic attacks -cont lorazepam for now--I reviewed her record now and we can consider alternatives for her SSRI or SNRI and then attempt to wean -none of this is helped by COVID isolation  3. Pure hypercholesterolemia -cont pravachol at this point, LDL pretty good with it on board and she's not having issues taking it or side effects  4. Primary open angle glaucoma, unspecified glaucoma stage, unspecified laterality -said to be in good shape with lumigan drops in both eyes daily  5. Cerebral amyloid angiopathy  (CODE) -noted on brain imaging and MMSE c/w mild to moderate dementia--cont assistance with meds and finances and monitor closely for increased needs as this can be expected to progress -her speech is definitely affected  6. Fuchs' corneal dystrophy -being monitored by Dr. MEllie Lunch 7. History of malignant melanoma of skin -follows with Dr. Amy JMartiniqueand just had checkup  8. Pernicious anemia -cont monthly B12 injections by her daughter   982 OSA on CPAP -cont CPAP which she's tolerating well  10. ACP (advance care planning) -has durable POA in matrix--copy printed for scanning in vynca -also discussed CPR and she is clear she does not want resuscitation in the event of cardiopulmonary arrest--she is a retired nMarine scientist-17 mins spent on ACP  11. DNR (do not resuscitate) - Do not attempt resuscitation (DNR)  12. Family history of breast cancer -has had bilateral mastectomies and implants prophylactically  13. Need for shingles vaccine - Zoster Vaccine Adjuvanted ([facility] injection; Inject 0.5 mLs into the muscle once for 1 dose.  Dispense: 0.5 mL; Refill: 1[facility] -was going to go get flu shot there today  Labs/tests ordered:  No new today Need to discuss and order repeat bone density next visit  TEncinitasL. Derwood Becraft, D.O. G[facility] [address] [id number] Phone (Mon-Fri 8am-5pm):  [phone] Call:  [phone]& follow prompts after 5pm & weekends Office Phone:  [phone] Fax:  [phone]

## 2018-12-09 DIAGNOSIS — Z66 Do not resuscitate: Secondary | ICD-10-CM | POA: Insufficient documentation

## 2018-12-09 DIAGNOSIS — I680 Cerebral amyloid angiopathy: Secondary | ICD-10-CM | POA: Insufficient documentation

## 2018-12-09 DIAGNOSIS — Z9989 Dependence on other enabling machines and devices: Secondary | ICD-10-CM | POA: Insufficient documentation

## 2018-12-09 DIAGNOSIS — F410 Panic disorder [episodic paroxysmal anxiety] without agoraphobia: Secondary | ICD-10-CM | POA: Insufficient documentation

## 2018-12-09 DIAGNOSIS — G4733 Obstructive sleep apnea (adult) (pediatric): Secondary | ICD-10-CM | POA: Insufficient documentation

## 2018-12-09 HISTORY — DX: Cerebral amyloid angiopathy: I68.0

## 2018-12-26 ENCOUNTER — Other Ambulatory Visit: Payer: Self-pay | Admitting: Family Medicine

## 2018-12-26 DIAGNOSIS — F418 Other specified anxiety disorders: Secondary | ICD-10-CM

## 2018-12-27 ENCOUNTER — Other Ambulatory Visit: Payer: Self-pay | Admitting: Internal Medicine

## 2018-12-27 DIAGNOSIS — F418 Other specified anxiety disorders: Secondary | ICD-10-CM

## 2018-12-28 ENCOUNTER — Other Ambulatory Visit: Payer: Self-pay | Admitting: *Deleted

## 2018-12-28 NOTE — Telephone Encounter (Signed)
Requested refill for Lorazepam already sent to Dr. [name] for approval.

## 2018-12-28 NOTE — Telephone Encounter (Signed)
Patient daughter, [name] requested refill. Stated that patient is almost out of it and doesn't have enough to last her until her appointment next week.   Pended Rx and sent to Dr. [name] for approval. .

## 2019-01-03 ENCOUNTER — Encounter: Payer: Self-pay | Admitting: Internal Medicine

## 2019-01-03 ENCOUNTER — Non-Acute Institutional Stay: Payer: Medicare Other | Admitting: Internal Medicine

## 2019-01-03 ENCOUNTER — Other Ambulatory Visit: Payer: Self-pay

## 2019-01-03 VITALS — BP 130/78 | HR 87 | Temp 97.2°F | Ht 63.0 in | Wt 121.4 lb

## 2019-01-03 DIAGNOSIS — F4321 Adjustment disorder with depressed mood: Secondary | ICD-10-CM | POA: Diagnosis not present

## 2019-01-03 DIAGNOSIS — I680 Cerebral amyloid angiopathy: Secondary | ICD-10-CM | POA: Diagnosis not present

## 2019-01-03 DIAGNOSIS — F410 Panic disorder [episodic paroxysmal anxiety] without agoraphobia: Secondary | ICD-10-CM | POA: Diagnosis not present

## 2019-01-03 MED ORDER — ESCITALOPRAM OXALATE 5 MG PO TABS: 5.0000 mg | ORAL_TABLET | Freq: Every day | ORAL | 0 refills | Status: DC

## 2019-01-03 MED ORDER — ESCITALOPRAM OXALATE 20 MG PO TABS: 10.0000 mg | ORAL_TABLET | Freq: Every day | ORAL | 0 refills | Status: DC

## 2019-01-03 MED ORDER — BUPROPION HCL ER (XL) 150 MG PO TB24: 150.0000 mg | ORAL_TABLET | Freq: Every day | ORAL | 1 refills | Status: DC

## 2019-01-03 NOTE — Progress Notes (Signed)
Location:  Occupational psychologist of Service:  Clinic (12)  Provider: Jazleen Robeck L. [name], D.O., C.M.D.  Code Status: DNR Goals of Care:  Advanced Directives 12/09/2018  Does Patient Have a Medical Advance Directive? Yes  Type of Advance Directive [place]  Does patient want to make changes to medical advance directive? No - Patient declined  Copy of [place] in Chart? No - copy requested     Chief Complaint  Patient presents with  . Medical Management of Chronic Issues    Follow up mood    HPI: Patient is a 78 y.o. female seen today for f/u on depression.  Ms. Costin is here with her daughter today.  She has more stories about her mother's depressed mood and anxiety.  She notes that her mom will spend whole days crying at times.  Ms. Wheatly continues to talk about how she likes to be around people.  We spent much of the visit discussing advantages of a move to assisted living.  Prior to the visit, the clinic nurse had advised me about an episode of the resident walking around in [place] wearing stockings and a sweater (no actual pants).  She was also seen on another occasion [place] in [place] area and she said she needed help figuring something out, but was unable to tell the clinic nurse (had already forgotten) what she was trying to figure out.  She was on the wrong floor of [place].  She remains currently adamant that she does NOT want to move now.  I recommended she think about it for the near future so she can be involved in the decision-making and it not be a crisis.  Her daughter is clearly in favor of the idea.    She continues to take her lexapro 20mg  daily and am lorazepam.  We discussed again the risks of increased lorazepam (as in adding an additional dose) and all agreed to avoid that.  I discussed the alternative meds for depression:  Cymbalta, remeron and wellbutrin.  We decided on wellbutrin.    Past Medical History:   Diagnosis Date  . Anxiety    Per [facility] New Patient Packet   . Bleb x 4   . Depression    Per [facility] New Patient Packet   . Dermatitis due to solar radiation   . Dyshidrosis 05/03/2007  . Fuchs' corneal dystrophy    Per [facility] New Patient Packet   . Glaucoma   . Heart murmur   . Hyperlipidemia    Per [facility] New Patient Packet   . Malignant melanoma ([place])    Per [facility] New Patient Packet   . Osteopenia   . Pernicious anemia    Per [facility] New Patient Packet   . Tinnitus     Past Surgical History:  Procedure Laterality Date  . CORNEAL TRANSPLANT     Dr.[facility], Per [place] New Patient Packet   . MASTECTOMY    . PLACEMENT OF BREAST IMPLANTS      Allergies  Allergen Reactions  . Sulfonamide Derivatives Other (See Comments)    Doesn't remember    Outpatient Encounter Medications as of 01/03/2019  Medication Sig  . aspirin EC 81 MG tablet Take 81 mg by mouth daily.  . cyanocobalamin (,VITAMIN B-12,) 1000 MCG/ML injection 1 mL monthly  . LORazepam (ATIVAN) 0.5 MG tablet TAKE ONE TABLET EVERY MORNING  . LUMIGAN 0.01 % SOLN Place 1 drop into both eyes daily.   Marland Kitchen  NEEDLE, DISP, 27 G (BD ECLIPSE NEEDLE) 27G X 1/2" MISC 1 mL by Does not apply route every 30 (thirty) days.  . pravastatin (PRAVACHOL) 10 MG tablet Take 1 tablet (10 mg total) by mouth daily.  Marland Kitchen buPROPion (WELLBUTRIN XL) 150 MG 24 hr tablet Take 1 tablet (150 mg total) by mouth daily.  Marland Kitchen escitalopram (LEXAPRO) 20 MG tablet Take 0.5 tablets (10 mg total) by mouth daily for 14 days.  Marland Kitchen escitalopram (LEXAPRO) 5 MG tablet Take 1 tablet (5 mg total) by mouth daily. When finish taking 1/2 of 20mg  pills for 2 wks  . [DISCONTINUED] escitalopram (LEXAPRO) 20 MG tablet Take 1 tablet (20 mg total) by mouth daily.   No facility-administered encounter medications on file as of 01/03/2019.     Review of Systems:  Review of Systems  Constitutional: Negative for chills and fever.  HENT: Negative for congestion and sore throat.   Eyes: Negative  for blurred vision.  Respiratory: Negative for cough and shortness of breath.   Cardiovascular: Negative for chest pain, palpitations and leg swelling.  Gastrointestinal: Negative for abdominal pain, constipation and diarrhea.  Genitourinary: Negative for dysuria.  Musculoskeletal: Negative for falls and joint pain.  Skin: Negative for itching and rash.  Neurological: Negative for dizziness and loss of consciousness.  Endo/Heme/Allergies: Does not bruise/bleed easily.  Psychiatric/Behavioral: Positive for depression and memory loss. The patient is nervous/anxious. The patient does not have insomnia.     Health Maintenance  Topic Date Due  . INFLUENZA VACCINE  09/09/2018  . TETANUS/TDAP  10/19/2020  . DEXA SCAN  Completed  . PNA vac Low Risk Adult  Completed    Physical Exam: Vitals:   01/03/19 0901  BP: 130/78  Pulse: 87  Temp: (!) 97.2 F (36.2 C)  TempSrc: Oral  SpO2: 97%  Weight: 121 lb 6.4 oz (55.1 kg)  Height: 5\' 3"  (1.6 m)   Body mass index is 21.51 kg/m. Physical Exam Vitals signs reviewed.  Constitutional:      Appearance: Normal appearance. She is normal weight.  HENT:     Head: Normocephalic and atraumatic.  Cardiovascular:     Rate and Rhythm: Normal rate and regular rhythm.     Pulses: Normal pulses.     Heart sounds: Normal heart sounds.  Pulmonary:     Effort: Pulmonary effort is normal.     Breath sounds: Normal breath sounds. No wheezing, rhonchi or rales.  Abdominal:     General: Bowel sounds are normal.  Musculoskeletal: Normal range of motion.  Skin:    General: Skin is warm and dry.  Neurological:     General: No focal deficit present.     Mental Status: She is alert and oriented to person, place, and time.     Comments: But poor short-term memory and struggles with word-finding  Psychiatric:     Comments: Appears near tears as we discuss AL     Labs reviewed: Basic Metabolic Panel: Recent Labs    03/07/18 1114  NA 141  K 4.1  CL  104  CO2 33*  GLUCOSE 92  BUN 20  CREATININE 0.77  CALCIUM 9.4   Liver Function Tests: Recent Labs    03/07/18 1114  AST 22  ALT 19  ALKPHOS 63  BILITOT 0.4  PROT 6.3  ALBUMIN 4.2   No results for input(s): LIPASE, AMYLASE in the last 8760 hours. No results for input(s): AMMONIA in the last 8760 hours. CBC: Recent Labs    03/07/18  1114  WBC 5.0  NEUTROABS 3.5  HGB 13.3  HCT 41.0  MCV 87.3  PLT 220.0   Lipid Panel: Recent Labs    03/07/18 1114  CHOL 194  HDL 67.90  LDLCALC 109*  TRIG 89.0  CHOLHDL 3   Assessment/Plan 1. Situational depression - will wean lexapro:  10mg  (1/2 of 20mg ) x 2 wks, then 5mg  x 2 wks ([facility] to [facility]), then stop -begin wellbutrin xl when received - buPROPion (WELLBUTRIN XL) 150 MG 24 hr tablet; Take 1 tablet (150 mg total) by mouth daily.  Dispense: 30 tablet; Refill: 1 - escitalopram (LEXAPRO) 5 MG tablet; Take 1 tablet (5 mg total) by mouth daily. When finish taking 1/2 of 20mg  pills for 2 wks  Dispense: 14 tablet; Refill: 0  2. Panic attacks -cont daily lorazepam until on stable dose of wellbutrin, then consider weaning it, as well -currently has not done well if she skips it - buPROPion (WELLBUTRIN XL) 150 MG 24 hr tablet; Take 1 tablet (150 mg total) by mouth daily.  Dispense: 30 tablet; Refill: 1 - escitalopram (LEXAPRO) 5 MG tablet; Take 1 tablet (5 mg total) by mouth daily. When finish taking 1/2 of 20mg  pills for 2 wks  Dispense: 14 tablet; Refill: 0  3. Cerebral amyloid angiopathy (CODE) -with last MMSE 18/30, some concerns about managing IADLs primarily, operating remote, getting lost some in [place], dressed inappropriately and getting stressed out and anxious in the afternoons when alone -recommended AL move--they have already toured and her daughter is on board, but resident is not yet ready   Labs/tests ordered:  No new Next appt:  02/14/2019  40 mins spent on visit with extensive counseling about memory and anxiety,  medications in older adults  F/u with Dr. Rexene Alberts re: sleep apnea  Yaqub Arney L. Samon Dishner, D.O. [facility] [address], [place] [address] Cell Phone (Mon-Fri 8am-5pm):  [phone] On Call:  [phone] & follow prompts after 5pm & weekends Office Phone:  [phone] Office Fax:  [phone]

## 2019-01-03 NOTE — Patient Instructions (Addendum)
Begin Wellbutrin XL 150mg  daily. Decrease lexapro to 10mg  daily for 2 weeks. Then lexapro 5mg  daily for 2 weeks, then stop.

## 2019-01-20 ENCOUNTER — Other Ambulatory Visit: Payer: Self-pay | Admitting: Internal Medicine

## 2019-01-20 DIAGNOSIS — F418 Other specified anxiety disorders: Secondary | ICD-10-CM

## 2019-01-22 NOTE — Telephone Encounter (Signed)
Last filled 12/20/2018

## 2019-02-14 ENCOUNTER — Encounter: Payer: Medicare Other | Admitting: Internal Medicine

## 2019-02-14 ENCOUNTER — Other Ambulatory Visit: Payer: Self-pay

## 2019-02-15 DIAGNOSIS — Z20828 Contact with and (suspected) exposure to other viral communicable diseases: Secondary | ICD-10-CM | POA: Diagnosis not present

## 2019-02-20 DIAGNOSIS — Z23 Encounter for immunization: Secondary | ICD-10-CM | POA: Diagnosis not present

## 2019-02-20 DIAGNOSIS — Z20828 Contact with and (suspected) exposure to other viral communicable diseases: Secondary | ICD-10-CM | POA: Diagnosis not present

## 2019-02-26 ENCOUNTER — Telehealth: Payer: Self-pay | Admitting: *Deleted

## 2019-02-26 ENCOUNTER — Other Ambulatory Visit: Payer: Self-pay | Admitting: Internal Medicine

## 2019-02-26 DIAGNOSIS — F410 Panic disorder [episodic paroxysmal anxiety] without agoraphobia: Secondary | ICD-10-CM

## 2019-02-26 DIAGNOSIS — F4321 Adjustment disorder with depressed mood: Secondary | ICD-10-CM

## 2019-02-26 MED ORDER — BUPROPION HCL ER (XL) 300 MG PO TB24: 300.0000 mg | ORAL_TABLET | Freq: Every day | ORAL | 3 refills | Status: DC

## 2019-02-26 NOTE — Telephone Encounter (Signed)
[place], I'm sorry to hear that.  I know her isolation has probably made it so much worse.  We were thinking that she'd do well with a move to [place] when we met last time b/c she likes socialization.  We can increase the wellbutrin xl to 345m po daily.  I sent it to [facility]  I will see her WSinda Duconfirm with her daughter that she will be out of isolation by then.  Thanks.

## 2019-02-26 NOTE — Telephone Encounter (Signed)
Patient daughter notified and agreed. She will pick up the Rx. Patient is out of Isolation. Stated that she has had 2 Negative Covid tests.

## 2019-02-26 NOTE — Telephone Encounter (Signed)
Patient daughter [name] called and left message on clinical intake on Friday stating that patient is sobbing uncontrollably everyday and then goes into a Panic. Stated that her medication was switched from Lexapro to Wellbutrin. Daughter feels like it needs to be increased. Patient has been on isolation. Please Advise.   (patient has an appointment at [facility] on Wednesday)

## 2019-02-26 NOTE — Progress Notes (Signed)
Increased wellbutrin xl to 300mg  daily as of today.

## 2019-02-28 ENCOUNTER — Non-Acute Institutional Stay: Payer: Medicare Other | Admitting: Internal Medicine

## 2019-02-28 ENCOUNTER — Encounter: Payer: Self-pay | Admitting: Internal Medicine

## 2019-02-28 ENCOUNTER — Other Ambulatory Visit: Payer: Self-pay

## 2019-02-28 VITALS — BP 132/72 | HR 87 | Temp 97.2°F | Ht 61.0 in | Wt 117.0 lb

## 2019-02-28 DIAGNOSIS — F0280 Dementia in other diseases classified elsewhere without behavioral disturbance: Secondary | ICD-10-CM | POA: Diagnosis not present

## 2019-02-28 DIAGNOSIS — F02B0 Dementia in other diseases classified elsewhere, moderate, without behavioral disturbance, psychotic disturbance, mood disturbance, and anxiety: Secondary | ICD-10-CM

## 2019-02-28 DIAGNOSIS — F410 Panic disorder [episodic paroxysmal anxiety] without agoraphobia: Secondary | ICD-10-CM

## 2019-02-28 DIAGNOSIS — F4321 Adjustment disorder with depressed mood: Secondary | ICD-10-CM

## 2019-02-28 DIAGNOSIS — I680 Cerebral amyloid angiopathy: Secondary | ICD-10-CM

## 2019-02-28 NOTE — Progress Notes (Signed)
Location:  [facility] clinic Provider:  Krystian Ferrentino L. [name], D.O., C.M.D.  Code Status: DNR Goals of Care:  Advanced Directives 02/28/2019  Does Patient Have a Medical Advance Directive? Yes  Type of Advance Directive Out of facility DNR (pink MOST or yellow form)  Does patient want to make changes to medical advance directive? No - Patient declined  Copy of [place] in Chart? -     Chief Complaint  Patient presents with  . Medical Management of Chronic Issues    Follow up on depression     HPI: Patient is a 79 y.o. female seen today for medical management of chronic diseases--f/u on depression/anxiety which was recently worse with covid exposure isolation.  [name] had tested positive and she was exposed and quarantined the 2 weeks.  She was not happy.  She again c/o loneliness over in [place].  She did have her covid vaccine the day she came out of quarantine a week and a half ago.   It didn't hurt or anything.    I brought up the idea of AL again with her.  She's got mixed emotions.  She does have good friends there.     Weight has dropped.  Says she is eating well, sleeping well.  She is bothered by the styrofoam container.    Past Medical History:  Diagnosis Date  . Anxiety    Per [facility] New Patient Packet   . Bleb x 4   . Depression    Per [facility] New Patient Packet   . Dermatitis due to solar radiation   . Dyshidrosis 05/03/2007  . Fuchs' corneal dystrophy    Per [facility] New Patient Packet   . Glaucoma   . Heart murmur   . Hyperlipidemia    Per [facility] New Patient Packet   . Malignant melanoma ([place])    Per [facility] New Patient Packet   . Osteopenia   . Pernicious anemia    Per [place] New Patient Packet   . Tinnitus     Past Surgical History:  Procedure Laterality Date  . CORNEAL TRANSPLANT     Dr.[facility], Per [place] New Patient Packet   . MASTECTOMY    . PLACEMENT OF BREAST IMPLANTS      Allergies  Allergen Reactions  . Sulfonamide Derivatives Other (See Comments)   Doesn't remember    Outpatient Encounter Medications as of 02/28/2019  Medication Sig  . aspirin EC 81 MG tablet Take 81 mg by mouth daily.  Marland Kitchen buPROPion (WELLBUTRIN XL) 300 MG 24 hr tablet Take 1 tablet (300 mg total) by mouth daily.  . cyanocobalamin (,VITAMIN B-12,) 1000 MCG/ML injection 1 mL monthly  . LORazepam (ATIVAN) 0.5 MG tablet TAKE ONE TABLET EVERY MORNING  . LUMIGAN 0.01 % SOLN Place 1 drop into both eyes daily.   Marland Kitchen NEEDLE, DISP, 27 G (BD ECLIPSE NEEDLE) 27G X 1/2" MISC 1 mL by Does not apply route every 30 (thirty) days.  . pravastatin (PRAVACHOL) 10 MG tablet Take 1 tablet (10 mg total) by mouth daily.   No facility-administered encounter medications on file as of 02/28/2019.    Review of Systems:  Review of Systems  Constitutional: Positive for weight loss. Negative for chills, fever and malaise/fatigue.  HENT: Negative for congestion and hearing loss.   Eyes: Negative for blurred vision.  Respiratory: Negative for cough and shortness of breath.   Cardiovascular: Negative for chest pain, palpitations and leg swelling.  Gastrointestinal: Negative for abdominal pain, blood in  stool, constipation, diarrhea and melena.  Genitourinary: Negative for dysuria, frequency and urgency.  Musculoskeletal: Negative for back pain, falls and joint pain.  Skin: Negative for itching and rash.  Neurological: Negative for dizziness and loss of consciousness.       Word-finding difficulty  Psychiatric/Behavioral: Positive for depression and memory loss. The patient is nervous/anxious. The patient does not have insomnia.     Health Maintenance  Topic Date Due  . TETANUS/TDAP  10/19/2020  . INFLUENZA VACCINE  Completed  . DEXA SCAN  Completed  . PNA vac Low Risk Adult  Completed    Physical Exam: Vitals:   02/28/19 1525  BP: 132/72  Pulse: 87  Temp: (!) 97.2 F (36.2 C)  TempSrc: Temporal  SpO2: 97%  Weight: 117 lb (53.1 kg)  Height: 5\' 1"  (1.549 m)   Body mass index is  22.11 kg/m. Physical Exam Constitutional:      General: She is not in acute distress.    Appearance: She is not ill-appearing or toxic-appearing.     Comments: Well-dressed and groomed; has visibly lost more weight  HENT:     Head: Normocephalic and atraumatic.  Cardiovascular:     Rate and Rhythm: Normal rate and regular rhythm.     Pulses: Normal pulses.     Heart sounds: Normal heart sounds.  Pulmonary:     Effort: Pulmonary effort is normal.     Breath sounds: Normal breath sounds.  Abdominal:     General: Bowel sounds are normal.  Musculoskeletal:        General: Normal range of motion.     Right lower leg: No edema.     Left lower leg: No edema.  Neurological:     General: No focal deficit present.     Mental Status: She is alert. Mental status is at baseline.     Cranial Nerves: No cranial nerve deficit.     Motor: No weakness.     Gait: Gait normal.  Psychiatric:     Comments: Spent the whole visit complaining about having no one to talk to and nothing to do     Labs reviewed: Basic Metabolic Panel: Recent Labs    03/07/18 1114  NA 141  K 4.1  CL 104  CO2 33*  GLUCOSE 92  BUN 20  CREATININE 0.77  CALCIUM 9.4   Liver Function Tests: Recent Labs    03/07/18 1114  AST 22  ALT 19  ALKPHOS 63  BILITOT 0.4  PROT 6.3  ALBUMIN 4.2   No results for input(s): LIPASE, AMYLASE in the last 8760 hours. No results for input(s): AMMONIA in the last 8760 hours. CBC: Recent Labs    03/07/18 1114  WBC 5.0  NEUTROABS 3.5  HGB 13.3  HCT 41.0  MCV 87.3  PLT 220.0   Lipid Panel: Recent Labs    03/07/18 1114  CHOL 194  HDL 67.90  LDLCALC 109*  TRIG 89.0  CHOLHDL 3   Assessment/Plan 1. Cerebral amyloid angiopathy (CODE) -cause of #2, her dementia -primary aphasia problem, but also short-term memory concerns progressing (getting lost in [place], not dressing appropriately w/o caregiver) -also follows with Dr. Rexene Alberts  2. Major neurocognitive disorder, due  to another medical condition, without behavioral disturbance, moderate ([place]) --discussed with social work about AL vs memory care -I'm a bit concerned about her mood declining more in memory care due to her great desire to eat in the dining room with friends, etc; however, she might surprise  me and be content there where others are struggling similarly from the cognitive perspective -I suggested that when isolation is not going on social work arrange for her to try out memory cane and see how she likes it -we had really spoken primarily about AL at the appts but I do think she will move fairly quickly to memory care from [place]    3. Situational depression -clearly worse amid covid--spends the whole visit venting about this on a repeated circuit -needs to be in an environment with friends in closer proximity and more to do  4. Panic attacks -continues to require benzo for her anxiety  Labs/tests ordered:  No reversible causes identified via lab--has changes on imaging  Next appt:  04/11/2019  Marleta Lapierre L. Darnise Montag, D.O. [facility] [address], [place] [address] Cell Phone (Mon-Fri 8am-5pm):  [phone] On Call:  [phone] & follow prompts after 5pm & weekends Office Phone:  [phone] Office Fax:  [phone]

## 2019-03-02 ENCOUNTER — Telehealth: Payer: Self-pay | Admitting: *Deleted

## 2019-03-02 NOTE — Telephone Encounter (Signed)
2nd shift independent nurse calling stating pt is having a runny nose and wanted to know if you wanted to covid test her? No other symptoms, I seen where pt was seen Wednesday at [facility], nothing was mentioned about a runny nose. Nurse is asking if pt needs to go back on isolation? Please advise

## 2019-03-03 DIAGNOSIS — F02B0 Dementia in other diseases classified elsewhere, moderate, without behavioral disturbance, psychotic disturbance, mood disturbance, and anxiety: Secondary | ICD-10-CM | POA: Insufficient documentation

## 2019-03-03 DIAGNOSIS — F0280 Dementia in other diseases classified elsewhere without behavioral disturbance: Secondary | ICD-10-CM | POA: Insufficient documentation

## 2019-03-03 NOTE — Telephone Encounter (Signed)
I didn't see this until Saturday due to covering [facility].  She'd had a previous exposure and already completed her isolation for that.  Had no further exposures.  Doubt this is covid-related at this point.

## 2019-03-06 NOTE — Telephone Encounter (Signed)
Spoke with clinic nurse susan and advised that pt does not need to be tested.

## 2019-03-09 ENCOUNTER — Telehealth: Payer: Self-pay

## 2019-03-09 NOTE — Telephone Encounter (Signed)
Patient daughter "Dennie Maizes" called and states that she has transitioned her mother "Carmen [name]" off Lexapro to Wellbutrin. Patient daughter states she has increased her intake of Wellbutrin to 300mg  a day. Daughter states that mother has been experiencing some behaviors such as extreme paranoia, states people in her closet, having crazy dreams that she cannot differentiate from reality, states people are mad at her, panic attacks in the evening/afternoon, and consistency crying. Daughter states that she know's her mom has Dementia but doesn't think that this is really causing her to behave this way. Please Advise.

## 2019-03-09 NOTE — Telephone Encounter (Signed)
Daughter called back and notified.  

## 2019-03-09 NOTE — Telephone Encounter (Signed)
Please call her daughter, [name], back and let her know that we'll reduce the wellbutrin to 150mg  for a week, then stop.  Let's see where she is without an antidepressant.  I'm concerned the wellbutrin could be contributing; however, if she is not better without it, it may actually be due to her dementia, unfortunately.

## 2019-03-20 DIAGNOSIS — Z23 Encounter for immunization: Secondary | ICD-10-CM | POA: Diagnosis not present

## 2019-03-26 ENCOUNTER — Other Ambulatory Visit: Payer: Self-pay | Admitting: Internal Medicine

## 2019-03-26 DIAGNOSIS — D510 Vitamin B12 deficiency anemia due to intrinsic factor deficiency: Secondary | ICD-10-CM

## 2019-03-28 ENCOUNTER — Non-Acute Institutional Stay: Payer: Medicare Other | Admitting: Internal Medicine

## 2019-03-28 ENCOUNTER — Other Ambulatory Visit: Payer: Self-pay

## 2019-03-28 ENCOUNTER — Encounter: Payer: Self-pay | Admitting: Internal Medicine

## 2019-03-28 VITALS — BP 128/78 | HR 81 | Temp 96.8°F | Ht 61.0 in | Wt 117.0 lb

## 2019-03-28 DIAGNOSIS — F4321 Adjustment disorder with depressed mood: Secondary | ICD-10-CM

## 2019-03-28 DIAGNOSIS — F02B0 Dementia in other diseases classified elsewhere, moderate, without behavioral disturbance, psychotic disturbance, mood disturbance, and anxiety: Secondary | ICD-10-CM

## 2019-03-28 DIAGNOSIS — F0280 Dementia in other diseases classified elsewhere without behavioral disturbance: Secondary | ICD-10-CM

## 2019-03-28 DIAGNOSIS — F410 Panic disorder [episodic paroxysmal anxiety] without agoraphobia: Secondary | ICD-10-CM

## 2019-03-28 DIAGNOSIS — I680 Cerebral amyloid angiopathy: Secondary | ICD-10-CM | POA: Diagnosis not present

## 2019-03-28 MED ORDER — MIRTAZAPINE 7.5 MG PO TABS: 7.5000 mg | ORAL_TABLET | Freq: Every day | ORAL | 3 refills | Status: DC

## 2019-03-28 NOTE — Progress Notes (Signed)
Location:  Occupational psychologist of Service:  Clinic (12)  Provider: Shahara Hartsfield L. [name], D.O., C.M.D.  Code Status: DNR Goals of Care:  Advanced Directives 03/28/2019  Does Patient Have a Medical Advance Directive? Yes  Type of Advance Directive Out of facility DNR (pink MOST or yellow form)  Does patient want to make changes to medical advance directive? No - Guardian declined  Copy of [place] in Chart? -     Chief Complaint  Patient presents with  . Medical Management of Chronic Issues     wants medication for anxiety  Daughter [name] is with her. Changes in sleeping, crying, dry heaving , short term memory loss.      HPI: Patient is a 79 y.o. female seen today for medical management of chronic diseases.    It is official that she is going to move in March.  She is now thinking she does need assisted living now.  She's having more trouble doing things on her own.    She admits to not being herself.  It's been very hard to live without seeing family as much.  Wants things to be like they always were.  She has been having tearful times.  Lots of those episodes per her daughter.  Says again how she wants to talk to people and loves to talk to people.  Word finding is even worse this visit than previous.  The second caregivers leave and make sure she takes her pills from her pillbox (8-11).  Two days per week, someone takes her out.  When they leave, she calls family for the next person to be there.  She cannot sit and do anything (like look at a magazine, watch a basketball game at her daughter's house).  Begged to go to her daughter's and then wanted to leave midway through the game.  She was sobbing early in the am from 445 to 8am so upset when her daughter was away for the weekend.  Her daughter feels like some of it is fear.  She was actually dry heaving when her daughter came over to fill her pillbox.  She is not having the crazy dreams since the  wellbutrin was stopped.  Not sleeping a full night.  Waking up very early.  At 8am, she's ready for lunch.  Moods will be different calling different children minutes apart.  She still had lots of sadness with lexapro.  She was losing weight on wellbutrin (4.4 lbs Nov to Jan) and was not eating a full meal with her daughter like she usually did.  It is picking back up some now.    She is needing cues for meals now like she does for medications.  She's been struggling to find her way to pick up her meals now.  She throws a fit if she does not have anyone to walk with her.  They are getting meals delivered now instead.    Past Medical History:  Diagnosis Date  . Anxiety    Per [facility] New Patient Packet   . Bleb x 4   . Depression    Per [facility] New Patient Packet   . Dermatitis due to solar radiation   . Dyshidrosis 05/03/2007  . Fuchs' corneal dystrophy    Per [facility] New Patient Packet   . Glaucoma   . Heart murmur   . Hyperlipidemia    Per [facility] New Patient Packet   . Malignant melanoma ([place])  Per [facility] New Patient Packet   . Osteopenia   . Pernicious anemia    Per [place] New Patient Packet   . Tinnitus     Past Surgical History:  Procedure Laterality Date  . CORNEAL TRANSPLANT     Dr.[facility], Per [place] New Patient Packet   . MASTECTOMY    . PLACEMENT OF BREAST IMPLANTS      Allergies  Allergen Reactions  . Sulfonamide Derivatives Other (See Comments)    Doesn't remember    Outpatient Encounter Medications as of 03/28/2019  Medication Sig  . aspirin EC 81 MG tablet Take 81 mg by mouth daily.  . cyanocobalamin (,VITAMIN B-12,) 1000 MCG/ML injection INJECT 81mL MONTHLY  . LORazepam (ATIVAN) 0.5 MG tablet TAKE ONE TABLET EVERY MORNING  . LUMIGAN 0.01 % SOLN Place 1 drop into both eyes daily.   Marland Kitchen NEEDLE, DISP, 27 G (BD ECLIPSE NEEDLE) 27G X 1/2" MISC 1 mL by Does not apply route every 30 (thirty) days.  . pravastatin (PRAVACHOL) 10 MG tablet Take 1 tablet (10 mg total) by mouth daily.    . [DISCONTINUED] buPROPion (WELLBUTRIN XL) 300 MG 24 hr tablet Take 1 tablet (300 mg total) by mouth daily.   No facility-administered encounter medications on file as of 03/28/2019.    Review of Systems:  Review of Systems  Constitutional: Negative for chills and fever.       Wt stable since stopping wellbutrin  HENT: Negative for congestion and sore throat.   Eyes: Negative for blurred vision.  Respiratory: Negative for cough and shortness of breath.   Cardiovascular: Negative for chest pain, palpitations and leg swelling.  Gastrointestinal: Negative for abdominal pain, blood in stool, constipation, diarrhea and melena.  Genitourinary: Negative for dysuria.  Musculoskeletal: Negative for falls and joint pain.  Skin: Negative for itching and rash.  Neurological: Negative for loss of consciousness.  Psychiatric/Behavioral: Positive for depression and memory loss. The patient is nervous/anxious and has insomnia.     Health Maintenance  Topic Date Due  . TETANUS/TDAP  10/19/2020  . INFLUENZA VACCINE  Completed  . DEXA SCAN  Completed  . PNA vac Low Risk Adult  Completed    Physical Exam: Vitals:   03/28/19 1501  BP: 128/78  Pulse: 81  Temp: (!) 96.8 F (36 C)  TempSrc: Temporal  SpO2: 97%  Weight: 117 lb (53.1 kg)  Height: 5\' 1"  (1.549 m)   Body mass index is 22.11 kg/m. Physical Exam Vitals reviewed.  Constitutional:      General: She is not in acute distress.    Appearance: She is not ill-appearing or toxic-appearing.     Comments: Appears exhausted, dark circles under eyes  HENT:     Head: Normocephalic and atraumatic.  Eyes:     Pupils: Pupils are equal, round, and reactive to light.  Cardiovascular:     Rate and Rhythm: Normal rate and regular rhythm.     Pulses: Normal pulses.     Heart sounds: Normal heart sounds.  Pulmonary:     Effort: Pulmonary effort is normal.     Breath sounds: Normal breath sounds. No wheezing, rhonchi or rales.  Abdominal:      General: Bowel sounds are normal.  Musculoskeletal:        General: Normal range of motion.     Right lower leg: No edema.     Left lower leg: No edema.  Neurological:     General: No focal deficit present.  Mental Status: She is alert.     Cranial Nerves: No cranial nerve deficit.     Motor: No weakness.     Gait: Gait normal.     Comments: Speech declined further since I last saw her--much of what she says is no longer clear  Psychiatric:     Comments: Affect flatter today off all antidepressants      Assessment/Plan 1. Situational depression - partially due to covid isolation, also seems partially due to declining ability to care for herself which leads to periods of confusion, anxiety and tearfulness -was already thin, but had been eating better when able to eat in dining room (but started getting lost coming and going) -lost wt on wellbutrin - will try remeron at hs - mirtazapine (REMERON) 7.5 MG tablet; Take 1 tablet (7.5 mg total) by mouth at bedtime.  Dispense: 30 tablet; Refill: 3  2. Panic attacks -cont just once daily lorazepam to avoid worsening anticholinergic burden and fall risk--I've been trying to find an antidepressant that works well enough to wean her entirely from the benzo - mirtazapine (REMERON) 7.5 MG tablet; Take 1 tablet (7.5 mg total) by mouth at bedtime.  Dispense: 30 tablet; Refill: 3  3. Cerebral amyloid angiopathy (CODE) -cause of her dementia -fortunately, her family and staff have convinced her now of need to move to [place] where she will have more people around her and get the supportive care she needs -her communication ability is declining rapidly but she is still in very good physical condition and able to do adls with cues  4. Major neurocognitive disorder, due to another medical condition, without behavioral disturbance, moderate ([place]) -dementia due to CODE with aphasia -cont supportive care with caregivers pending her move to [place] in March   [place] Exam 12/06/2018  Orientation to time 1  Orientation to Place 4  Registration 3  Attention/ Calculation 2  Recall 0  Language- name 2 objects 2  Language- repeat 1  Language- follow 3 step command 3  Language- read & follow direction 1  Write a sentence 1  Copy design 0  Total score 18    Labs/tests ordered:  Needs a new set of labs Next appt:  04/11/2019  Tykisha Areola L. Aquarius Latouche, D.O. [facility] [address], [place] [address] Cell Phone (Mon-Fri 8am-5pm):  [phone] On Call:  [phone] & follow prompts after 5pm & weekends Office Phone:  [phone] Office Fax:  [phone]

## 2019-04-04 ENCOUNTER — Other Ambulatory Visit: Payer: Self-pay | Admitting: Internal Medicine

## 2019-04-04 ENCOUNTER — Other Ambulatory Visit: Payer: Self-pay | Admitting: Family Medicine

## 2019-04-04 DIAGNOSIS — E7800 Pure hypercholesterolemia, unspecified: Secondary | ICD-10-CM

## 2019-04-11 ENCOUNTER — Encounter: Payer: Self-pay | Admitting: Internal Medicine

## 2019-04-12 ENCOUNTER — Ambulatory Visit: Payer: Medicare Other | Admitting: Neurology

## 2019-04-19 ENCOUNTER — Encounter: Payer: Medicare Other | Admitting: Family

## 2019-04-19 ENCOUNTER — Other Ambulatory Visit: Payer: Self-pay

## 2019-04-24 ENCOUNTER — Non-Acute Institutional Stay: Payer: Medicare Other | Admitting: Internal Medicine

## 2019-04-24 ENCOUNTER — Encounter: Payer: Self-pay | Admitting: Internal Medicine

## 2019-04-24 DIAGNOSIS — I680 Cerebral amyloid angiopathy: Secondary | ICD-10-CM | POA: Diagnosis not present

## 2019-04-24 DIAGNOSIS — H18519 Endothelial corneal dystrophy, unspecified eye: Secondary | ICD-10-CM | POA: Diagnosis not present

## 2019-04-24 DIAGNOSIS — Z85820 Personal history of malignant melanoma of skin: Secondary | ICD-10-CM | POA: Diagnosis not present

## 2019-04-24 DIAGNOSIS — F410 Panic disorder [episodic paroxysmal anxiety] without agoraphobia: Secondary | ICD-10-CM

## 2019-04-24 DIAGNOSIS — H401190 Primary open-angle glaucoma, unspecified eye, stage unspecified: Secondary | ICD-10-CM | POA: Diagnosis not present

## 2019-04-24 DIAGNOSIS — F0280 Dementia in other diseases classified elsewhere without behavioral disturbance: Secondary | ICD-10-CM

## 2019-04-24 DIAGNOSIS — F02B0 Dementia in other diseases classified elsewhere, moderate, without behavioral disturbance, psychotic disturbance, mood disturbance, and anxiety: Secondary | ICD-10-CM

## 2019-04-24 DIAGNOSIS — F4321 Adjustment disorder with depressed mood: Secondary | ICD-10-CM | POA: Diagnosis not present

## 2019-04-24 DIAGNOSIS — G4733 Obstructive sleep apnea (adult) (pediatric): Secondary | ICD-10-CM

## 2019-04-24 DIAGNOSIS — D510 Vitamin B12 deficiency anemia due to intrinsic factor deficiency: Secondary | ICD-10-CM

## 2019-04-24 DIAGNOSIS — E7800 Pure hypercholesterolemia, unspecified: Secondary | ICD-10-CM | POA: Diagnosis not present

## 2019-04-24 DIAGNOSIS — Z9989 Dependence on other enabling machines and devices: Secondary | ICD-10-CM | POA: Diagnosis not present

## 2019-04-24 NOTE — Progress Notes (Signed)
A user error has taken place: encounter opened in error, closed for administrative reasons.

## 2019-04-24 NOTE — Progress Notes (Signed)
Patient ID: Carmen [name], female   DOB: Jun 29, 1940, 79 y.o.   MRN: [id number]  Provider:  Rexene Edison. [name], D.O., C.M.D. Location:  [place] Room Number: [id number] Place of Service:  ALF (13)  PCP: [name], DO Patient Care Team: [name], DO as PCP - General (Geriatric Medicine) Martinique, Amy, MD as Consulting Physician (Dermatology) Luberta Mutter, MD as Consulting Physician (Ophthalmology) Star Age, MD as Attending Physician (Neurology)  Extended Emergency Contact Information Primary Emergency Contact: [name] [place] Phone: [phone] Mobile Phone: [phone] Relation: Daughter Secondary Emergency Contact: Jimaya, [place] Mobile Phone: [phone] Relation: Son  Code Status: DNR Goals of Care: Advanced Directive information Advanced Directives 04/24/2019  Does Patient Have a Medical Advance Directive? Yes  Type of Advance Directive Out of facility DNR (pink MOST or yellow form)  Does patient want to make changes to medical advance directive? No - Patient declined  Copy of [place] in Chart? -  Pre-existing out of facility DNR order (yellow form or pink MOST form) Yellow form placed in chart (order not valid for inpatient use)   Chief Complaint  Patient presents with  . New Admit To AL    [place] to [place] due to Dementia     HPI: Patient is a 79 y.o. female with h/o cerebral amyloid angopathy causing major neurocognitive disorder, OSA on CPAP, depression, anxiety, pernicious anemia, hyperlipidemia, fuchs's corneal dystrophy, and glaucoma seen today for admission to [place] from [place] due to progressing dementia.  Carmen [name] had been very depressed in [place].  She wanted to be around other people and felt isolated especially amid covid.  After several visits and discussions in clinic with her daughter and son, they were supportive of a move to the AL setting.  Pt had been wandering and  getting lost in [place].  She was unable to go get her meals on her own.  She remains independent in her ADLs but requires her meds to be administered.    She is quite happy and content now in her new AL apt.  She was all excited to tell me how people are so nice and sociable, keep doors open and visit.  Of course, there have also been changes just recently with covid isolation being less stringent with the latest guidelines that have also helped this.  She's participating in activities and walking around the unit.  Her spirits are much better.  Unfortunately, when I saw her, she was downstairs (apt upstairs) and did need guidance to return to her apt.  She did not recall who I was until I told her and she read my name tag.    Past Medical History:  Diagnosis Date  . Anxiety    Per [facility] New Patient Packet   . Bleb x 4   . Depression    Per [facility] New Patient Packet   . Dermatitis due to solar radiation   . Dyshidrosis 05/03/2007  . Fuchs' corneal dystrophy    Per [facility] New Patient Packet   . Glaucoma   . Heart murmur   . Hyperlipidemia    Per [facility] New Patient Packet   . Malignant melanoma ([place])    Per [facility] New Patient Packet   . Osteopenia   . Pernicious anemia    Per [place] New Patient Packet   . Tinnitus    Past Surgical History:  Procedure Laterality Date  . CORNEAL TRANSPLANT  Dr.[facility], Per [facility] New Patient Packet   . MASTECTOMY    . PLACEMENT OF BREAST IMPLANTS      Social History   Socioeconomic History  . Marital status: Married    Spouse name: Not on file  . Number of children: Not on file  . Years of education: Not on file  . Highest education level: Not on file  Occupational History    Employer: [employer]  Tobacco Use  . Smoking status: Never Smoker  . Smokeless tobacco: Never Used  Substance and Sexual Activity  . Alcohol use: Yes    Comment: 1 drink weekly per [facility] New Patient Packet 12/05/2018   . Drug use: Never  . Sexual activity: Not Currently    Other Topics Concern  . Not on file  Social History Narrative   Per [facility] New Patient Packet abstracted 12/05/2018:   Diet: Regular       Caffeine: Occasionally       Married, if yes what year: Widow, married 1965      Do you live in a house, apartment, assisted living, [place], trailer, ect: [place] resident at [employer], 1 stories, 1 person      Pets: No      Current/Past profession: Buyer, retail, Marine scientist    Exercise:Yes, 3 x weekly          Living Will: Yes   DNR: Left blank    POA/HPOA: Left blank       Functional Status:   Do you have difficulty bathing or dressing yourself? No   Do you have difficulty preparing food or eating? No   Do you have difficulty managing your medications? No answer selected, notation- pill box filled by daughter   Do you have difficulty managing your finances? Yes, patients son handles her finances    Do you have difficulty affording your medications? No   Social Determinants of Health   Financial Resource Strain:   . Difficulty of Paying Living Expenses:   Food Insecurity:   . Worried About Charity fundraiser in the Last Year:   . Arboriculturist in the Last Year:   Transportation Needs:   . Film/video editor (Medical):   Marland Kitchen Lack of Transportation (Non-Medical):   Physical Activity:   . Days of Exercise per Week:   . Minutes of Exercise per Session:   Stress:   . Feeling of Stress :   Social Connections:   . Frequency of Communication with Friends and Family:   . Frequency of Social Gatherings with Friends and Family:   . Attends Religious Services:   . Active Member of Clubs or Organizations:   . Attends Archivist Meetings:   Marland Kitchen Marital Status:     reports that she has never smoked. She has never used smokeless tobacco. She reports current alcohol use. She reports that she does not use drugs.  Functional Status Survey:    Family History  Problem Relation Age of Onset  . Stroke Other   . Heart  disease Other   . Breast cancer Other   . Breast cancer Mother     Health Maintenance  Topic Date Due  . Samul Dada  10/19/2020  . INFLUENZA VACCINE  Completed  . DEXA SCAN  Completed  . PNA vac Low Risk Adult  Completed    Allergies  Allergen Reactions  . Sulfonamide Derivatives Other (See Comments)    Doesn't remember    Outpatient Encounter Medications as  of 04/24/2019  Medication Sig  . aspirin EC 81 MG tablet Take 81 mg by mouth daily.  . cyanocobalamin (,VITAMIN B-12,) 1000 MCG/ML injection INJECT 14mL MONTHLY  . LORazepam (ATIVAN) 0.5 MG tablet TAKE ONE TABLET EVERY MORNING  . LUMIGAN 0.01 % SOLN Place 1 drop into both eyes daily.   . mirtazapine (REMERON) 7.5 MG tablet Take 1 tablet (7.5 mg total) by mouth at bedtime.  Marland Kitchen NEEDLE, DISP, 27 G (BD ECLIPSE NEEDLE) 27G X 1/2" MISC 1 mL by Does not apply route every 30 (thirty) days.  . pravastatin (PRAVACHOL) 10 MG tablet TAKE ONE TABLET DAILY   No facility-administered encounter medications on file as of 04/24/2019.    Review of Systems  Constitutional: Negative for chills, fever and malaise/fatigue.  HENT: Negative for congestion and hearing loss.   Eyes: Negative for blurred vision.  Respiratory: Negative for cough and shortness of breath.   Cardiovascular: Negative for chest pain, palpitations and leg swelling.  Gastrointestinal: Negative for abdominal pain, blood in stool, constipation, diarrhea, melena, nausea and vomiting.  Genitourinary: Negative for dysuria.  Musculoskeletal: Negative for falls and joint pain.  Skin: Negative for itching and rash.  Neurological: Negative for dizziness and loss of consciousness.  Endo/Heme/Allergies: Does not bruise/bleed easily.  Psychiatric/Behavioral: Positive for memory loss. Negative for depression. The patient is not nervous/anxious and does not have insomnia.     Vitals:   04/24/19 1329  BP: 120/84  Pulse: 91  Temp: 97.9 F (36.6 C)  SpO2: 95%  Weight: 119 lb 0.2  oz (54 kg)  Height: 5\' 1"  (1.549 m)   Body mass index is 22.49 kg/m. Physical Exam Vitals and nursing note reviewed.  Constitutional:      General: She is not in acute distress.    Appearance: Normal appearance. She is not toxic-appearing.     Comments: Thin female, well-dressed and groomed--now wearing full make-up  HENT:     Head: Normocephalic and atraumatic.     Right Ear: External ear normal.     Left Ear: External ear normal.     Nose: Nose normal.     Mouth/Throat:     Pharynx: Oropharynx is clear.  Eyes:     Extraocular Movements: Extraocular movements intact.     Conjunctiva/sclera: Conjunctivae normal.     Pupils: Pupils are equal, round, and reactive to light.  Cardiovascular:     Rate and Rhythm: Normal rate and regular rhythm.     Pulses: Normal pulses.     Heart sounds: Normal heart sounds.  Pulmonary:     Effort: Pulmonary effort is normal. No respiratory distress.     Breath sounds: Normal breath sounds. No wheezing, rhonchi or rales.  Abdominal:     General: Bowel sounds are normal. There is no distension.     Palpations: Abdomen is soft. There is no mass.     Tenderness: There is no abdominal tenderness.  Musculoskeletal:        General: Normal range of motion.     Cervical back: Neck supple.     Right lower leg: No edema.     Left lower leg: No edema.  Lymphadenopathy:     Cervical: No cervical adenopathy.  Skin:    General: Skin is warm and dry.     Capillary Refill: Capillary refill takes less than 2 seconds.  Neurological:     General: No focal deficit present.     Mental Status: She is alert. Mental status is at baseline.  Cranial Nerves: No cranial nerve deficit.     Comments: Oriented to person, place, time, but had been lost on the first floor of AL  Psychiatric:        Mood and Affect: Mood normal.     Comments: Friendly, bubbly, sociable, repeating herself      Labs reviewed: Basic Metabolic Panel: No results for input(s): NA, K,  CL, CO2, GLUCOSE, BUN, CREATININE, CALCIUM, MG, PHOS in the last 8760 hours. Liver Function Tests: No results for input(s): AST, ALT, ALKPHOS, BILITOT, PROT, ALBUMIN in the last 8760 hours. No results for input(s): LIPASE, AMYLASE in the last 8760 hours. No results for input(s): AMMONIA in the last 8760 hours. CBC: No results for input(s): WBC, NEUTROABS, HGB, HCT, MCV, PLT in the last 8760 hours. Cardiac Enzymes: No results for input(s): CKTOTAL, CKMB, CKMBINDEX, TROPONINI in the last 8760 hours. BNP: Invalid input(s): POCBNP No results found for: HGBA1C Lab Results  Component Value Date   TSH 1.77 04/11/2017   Lab Results  Component Value Date   [id number] 03/07/2018   No results found for: FOLATE No results found for: IRON, TIBC, FERRITIN  Imaging and Procedures obtained prior to SNF admission: MR BRAIN W WO CONTRAST  Result Date: 03/18/2017 CLINICAL DATA:  Visual field defect. Creatinine was obtained on site at [facility] at [address] Results: Creatinine 0.8 mg/dL. EXAM: MRI HEAD WITHOUT AND WITH CONTRAST TECHNIQUE: Multiplanar, multiecho pulse sequences of the brain and surrounding structures were obtained without and with intravenous contrast. CONTRAST:  56mL MULTIHANCE GADOBENATE DIMEGLUMINE 529 MG/ML IV SOLN COMPARISON:  None. FINDINGS: Brain: The midline structures are normal. There is no acute infarct or acute hemorrhage. No mass lesion, hydrocephalus, dural abnormality or extra-axial collection. There is diffuse confluent hyperintense T2-weighted signal within the periventricular, deep and juxtacortical white matter, consistent with severe chronic ischemic microangiopathy. No age-advanced or lobar predominant atrophy. There are innumerable chronic microhemorrhages throughout all lobes of the cerebrum and cerebellum in a peripheral predominant distribution. There are areas of superficial hemosiderin deposition in the right frontal lobe and both occipital  lobes. Vascular: Major intracranial arterial and venous sinus flow voids are preserved. Skull and upper cervical spine: The visualized skull base, calvarium, upper cervical spine and extracranial soft tissues are normal. Sinuses/Orbits: No fluid levels or advanced mucosal thickening. No mastoid or middle ear effusion. Normal orbits. IMPRESSION: 1. No acute intracranial abnormality. 2. Innumerable peripheral predominant chronic microhemorrhages throughout the brain, consistent with severe cerebral amyloid angiopathy. Confluent white matter disease is probably a sequela of this disease and a component of chronic small vessel ischemia. Electronically Signed   By: Ulyses Jarred M.D.   On: 03/18/2017 19:39    Assessment/Plan 1. Cerebral amyloid angiopathy (CODE) -progressive, cause of #2  2. Major neurocognitive disorder, due to another medical condition, without behavioral disturbance, moderate ([place]) -now requiring med mgt -unfortunately, she may need to move to memory care in short order if she wanders off too much, but is happy and content now that she has more people around her and things to do  3. Situational depression -improved, cont remeron at hs  4. Panic attacks -has ativan in the am which she came to me with--does have anxiety, but now seems like her "anxiety" is more just repetition as an effect of her dementia -may be able to wean lorazepam if she continues to do so well here in [place]  5. Pure hypercholesterolemia -is on a low dose of pravachol -may need  to increase dose especially as she's eating better over here Lab Results  Component Value Date   CHOL 194 03/07/2018   HDL 67.90 03/07/2018   LDLCALC 109 (H) 03/07/2018   LDLDIRECT 165.1 03/08/2013   TRIG 89.0 03/07/2018   CHOLHDL 3 03/07/2018   6. Primary open angle glaucoma, unspecified glaucoma stage, unspecified laterality -cont lumigan drops and monitoring pressures thru ophtho  7. Fuchs' corneal dystrophy -followed by  ophtho, Dr. Ellie Lunch  8. History of malignant melanoma of skin -followed by derm--needs at least annual skin exams--has been going out to Dr. Martinique--? Last visit  9. Pernicious anemia -last blood count normal, on chronic b12 injections monthly Lab Results  Component Value Date   HGB 13.3 03/07/2018    10. OSA on CPAP -need to f/u and make sure she's using this?  Family/ staff Communication: discussed with AL nurses and staff  Labs/tests ordered:  No new F/u in clinic as planned  [place]. Kassidee Narciso, D.O. [facility] [address], [place] [address] Cell Phone (Mon-Fri 8am-5pm):  [phone] On Call:  [phone] & follow prompts after 5pm & weekends Office Phone:  [phone] Office Fax:  [phone]

## 2019-05-02 ENCOUNTER — Encounter: Payer: Self-pay | Admitting: Internal Medicine

## 2019-05-10 ENCOUNTER — Ambulatory Visit: Payer: Medicare Other | Admitting: Neurology

## 2019-05-14 ENCOUNTER — Telehealth: Payer: Self-pay

## 2019-05-14 NOTE — Telephone Encounter (Signed)
There is no data past 01/17/19 for pt's cpap. I called pt to confirm this is correct. If not, pt will need to bring her cpap for download tomorrow.  No answer, phone just keeps ringing.

## 2019-05-15 ENCOUNTER — Other Ambulatory Visit: Payer: Self-pay

## 2019-05-15 ENCOUNTER — Ambulatory Visit (INDEPENDENT_AMBULATORY_CARE_PROVIDER_SITE_OTHER): Payer: Medicare Other | Admitting: Neurology

## 2019-05-15 ENCOUNTER — Encounter: Payer: Self-pay | Admitting: Neurology

## 2019-05-15 VITALS — BP 122/81 | HR 75 | Ht 62.0 in | Wt 119.0 lb

## 2019-05-15 DIAGNOSIS — R413 Other amnesia: Secondary | ICD-10-CM

## 2019-05-15 DIAGNOSIS — I680 Cerebral amyloid angiopathy: Secondary | ICD-10-CM | POA: Diagnosis not present

## 2019-05-15 DIAGNOSIS — G4733 Obstructive sleep apnea (adult) (pediatric): Secondary | ICD-10-CM

## 2019-05-15 NOTE — Patient Instructions (Addendum)
I would like for you to restart using your CPAP. Please try the best you can. The Staff at [facility] will hopefully be able to assist you in putting the mask and headgear on at night.  Ultimately, using your CPAP consistently may help you obtain better quality and more restful sleep at night and also help preserve your memory function longer-term.  Please follow-up routinely to see the nurse practitioner in 3 months.
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
## 2019-05-16 ENCOUNTER — Other Ambulatory Visit: Payer: Self-pay | Admitting: Internal Medicine

## 2019-05-16 DIAGNOSIS — F418 Other specified anxiety disorders: Secondary | ICD-10-CM

## 2019-05-16 MED ORDER — LORAZEPAM 0.5 MG PO TABS: 0.5000 mg | ORAL_TABLET | Freq: Every day | ORAL | 0 refills | Status: DC | PRN

## 2019-05-16 NOTE — Progress Notes (Signed)
Opted to try to make ativan prn due to possible paradoxical reaction in her.  Reassess after 14 days.

## 2019-06-04 DIAGNOSIS — R41841 Cognitive communication deficit: Secondary | ICD-10-CM | POA: Diagnosis not present

## 2019-06-04 DIAGNOSIS — F0280 Dementia in other diseases classified elsewhere without behavioral disturbance: Secondary | ICD-10-CM | POA: Diagnosis not present

## 2019-06-04 DIAGNOSIS — I680 Cerebral amyloid angiopathy: Secondary | ICD-10-CM | POA: Diagnosis not present

## 2019-06-04 DIAGNOSIS — G4733 Obstructive sleep apnea (adult) (pediatric): Secondary | ICD-10-CM | POA: Diagnosis not present

## 2019-06-04 DIAGNOSIS — F410 Panic disorder [episodic paroxysmal anxiety] without agoraphobia: Secondary | ICD-10-CM | POA: Diagnosis not present

## 2019-06-06 ENCOUNTER — Encounter: Payer: Medicare Other | Admitting: Internal Medicine

## 2019-06-06 ENCOUNTER — Other Ambulatory Visit: Payer: Self-pay

## 2019-06-11 DIAGNOSIS — R41841 Cognitive communication deficit: Secondary | ICD-10-CM | POA: Diagnosis not present

## 2019-06-11 DIAGNOSIS — I680 Cerebral amyloid angiopathy: Secondary | ICD-10-CM | POA: Diagnosis not present

## 2019-06-11 DIAGNOSIS — F0280 Dementia in other diseases classified elsewhere without behavioral disturbance: Secondary | ICD-10-CM | POA: Diagnosis not present

## 2019-06-11 DIAGNOSIS — G4733 Obstructive sleep apnea (adult) (pediatric): Secondary | ICD-10-CM | POA: Diagnosis not present

## 2019-06-11 DIAGNOSIS — F410 Panic disorder [episodic paroxysmal anxiety] without agoraphobia: Secondary | ICD-10-CM | POA: Diagnosis not present

## 2019-06-14 DIAGNOSIS — F410 Panic disorder [episodic paroxysmal anxiety] without agoraphobia: Secondary | ICD-10-CM | POA: Diagnosis not present

## 2019-06-14 DIAGNOSIS — I680 Cerebral amyloid angiopathy: Secondary | ICD-10-CM | POA: Diagnosis not present

## 2019-06-14 DIAGNOSIS — F0280 Dementia in other diseases classified elsewhere without behavioral disturbance: Secondary | ICD-10-CM | POA: Diagnosis not present

## 2019-06-14 DIAGNOSIS — R41841 Cognitive communication deficit: Secondary | ICD-10-CM | POA: Diagnosis not present

## 2019-06-14 DIAGNOSIS — G4733 Obstructive sleep apnea (adult) (pediatric): Secondary | ICD-10-CM | POA: Diagnosis not present

## 2019-06-18 DIAGNOSIS — R41841 Cognitive communication deficit: Secondary | ICD-10-CM | POA: Diagnosis not present

## 2019-06-18 DIAGNOSIS — F410 Panic disorder [episodic paroxysmal anxiety] without agoraphobia: Secondary | ICD-10-CM | POA: Diagnosis not present

## 2019-06-18 DIAGNOSIS — F0280 Dementia in other diseases classified elsewhere without behavioral disturbance: Secondary | ICD-10-CM | POA: Diagnosis not present

## 2019-06-18 DIAGNOSIS — I680 Cerebral amyloid angiopathy: Secondary | ICD-10-CM | POA: Diagnosis not present

## 2019-06-18 DIAGNOSIS — G4733 Obstructive sleep apnea (adult) (pediatric): Secondary | ICD-10-CM | POA: Diagnosis not present

## 2019-06-19 DIAGNOSIS — I680 Cerebral amyloid angiopathy: Secondary | ICD-10-CM | POA: Diagnosis not present

## 2019-06-19 DIAGNOSIS — G4733 Obstructive sleep apnea (adult) (pediatric): Secondary | ICD-10-CM | POA: Diagnosis not present

## 2019-06-19 DIAGNOSIS — R41841 Cognitive communication deficit: Secondary | ICD-10-CM | POA: Diagnosis not present

## 2019-06-19 DIAGNOSIS — F410 Panic disorder [episodic paroxysmal anxiety] without agoraphobia: Secondary | ICD-10-CM | POA: Diagnosis not present

## 2019-06-19 DIAGNOSIS — F0280 Dementia in other diseases classified elsewhere without behavioral disturbance: Secondary | ICD-10-CM | POA: Diagnosis not present

## 2019-06-29 DIAGNOSIS — G4733 Obstructive sleep apnea (adult) (pediatric): Secondary | ICD-10-CM | POA: Diagnosis not present

## 2019-06-29 DIAGNOSIS — F0280 Dementia in other diseases classified elsewhere without behavioral disturbance: Secondary | ICD-10-CM | POA: Diagnosis not present

## 2019-06-29 DIAGNOSIS — R41841 Cognitive communication deficit: Secondary | ICD-10-CM | POA: Diagnosis not present

## 2019-06-29 DIAGNOSIS — F410 Panic disorder [episodic paroxysmal anxiety] without agoraphobia: Secondary | ICD-10-CM | POA: Diagnosis not present

## 2019-06-29 DIAGNOSIS — I680 Cerebral amyloid angiopathy: Secondary | ICD-10-CM | POA: Diagnosis not present

## 2019-07-04 DIAGNOSIS — F0280 Dementia in other diseases classified elsewhere without behavioral disturbance: Secondary | ICD-10-CM | POA: Diagnosis not present

## 2019-07-04 DIAGNOSIS — R41841 Cognitive communication deficit: Secondary | ICD-10-CM | POA: Diagnosis not present

## 2019-07-04 DIAGNOSIS — F410 Panic disorder [episodic paroxysmal anxiety] without agoraphobia: Secondary | ICD-10-CM | POA: Diagnosis not present

## 2019-07-04 DIAGNOSIS — G4733 Obstructive sleep apnea (adult) (pediatric): Secondary | ICD-10-CM | POA: Diagnosis not present

## 2019-07-04 DIAGNOSIS — I680 Cerebral amyloid angiopathy: Secondary | ICD-10-CM | POA: Diagnosis not present

## 2019-08-21 ENCOUNTER — Ambulatory Visit: Payer: Medicare Other | Admitting: Adult Health

## 2019-08-29 DIAGNOSIS — Z85820 Personal history of malignant melanoma of skin: Secondary | ICD-10-CM | POA: Diagnosis not present

## 2019-08-29 DIAGNOSIS — L820 Inflamed seborrheic keratosis: Secondary | ICD-10-CM | POA: Diagnosis not present

## 2019-08-29 DIAGNOSIS — L570 Actinic keratosis: Secondary | ICD-10-CM | POA: Diagnosis not present

## 2019-08-29 DIAGNOSIS — Z85068 Personal history of other malignant neoplasm of small intestine: Secondary | ICD-10-CM | POA: Diagnosis not present

## 2019-08-29 DIAGNOSIS — L821 Other seborrheic keratosis: Secondary | ICD-10-CM | POA: Diagnosis not present

## 2019-09-03 ENCOUNTER — Telehealth: Payer: Self-pay | Admitting: Adult Health

## 2019-09-03 NOTE — Telephone Encounter (Signed)
Pt's daughter, Dennie Maizes (on [place]) would like to speak with the nurse before scheduled  appt in August concerning if CPAP is effective. Pt is not wearing the CPAP all night. Daughter would like a phone.

## 2019-09-11 ENCOUNTER — Encounter: Payer: Self-pay | Admitting: Internal Medicine

## 2019-09-11 ENCOUNTER — Other Ambulatory Visit: Payer: Self-pay

## 2019-09-11 ENCOUNTER — Encounter: Payer: Self-pay | Admitting: Adult Health

## 2019-09-11 ENCOUNTER — Ambulatory Visit (INDEPENDENT_AMBULATORY_CARE_PROVIDER_SITE_OTHER): Payer: Medicare Other | Admitting: Adult Health

## 2019-09-11 VITALS — BP 132/80 | HR 78 | Ht 63.0 in | Wt 128.0 lb

## 2019-09-11 DIAGNOSIS — G4733 Obstructive sleep apnea (adult) (pediatric): Secondary | ICD-10-CM | POA: Diagnosis not present

## 2019-09-11 DIAGNOSIS — Z9989 Dependence on other enabling machines and devices: Secondary | ICD-10-CM | POA: Diagnosis not present
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
## 2019-09-12 ENCOUNTER — Non-Acute Institutional Stay: Payer: Medicare Other | Admitting: Internal Medicine

## 2019-09-12 ENCOUNTER — Other Ambulatory Visit: Payer: Self-pay

## 2019-09-12 ENCOUNTER — Encounter: Payer: Self-pay | Admitting: Internal Medicine

## 2019-09-12 VITALS — BP 122/82 | HR 93 | Temp 97.8°F | Ht 62.0 in | Wt 129.0 lb

## 2019-09-12 DIAGNOSIS — E7800 Pure hypercholesterolemia, unspecified: Secondary | ICD-10-CM | POA: Diagnosis not present

## 2019-09-12 DIAGNOSIS — F0280 Dementia in other diseases classified elsewhere without behavioral disturbance: Secondary | ICD-10-CM

## 2019-09-12 DIAGNOSIS — I680 Cerebral amyloid angiopathy: Secondary | ICD-10-CM | POA: Diagnosis not present

## 2019-09-12 DIAGNOSIS — F02B0 Dementia in other diseases classified elsewhere, moderate, without behavioral disturbance, psychotic disturbance, mood disturbance, and anxiety: Secondary | ICD-10-CM

## 2019-09-12 DIAGNOSIS — L570 Actinic keratosis: Secondary | ICD-10-CM

## 2019-09-12 DIAGNOSIS — F410 Panic disorder [episodic paroxysmal anxiety] without agoraphobia: Secondary | ICD-10-CM | POA: Diagnosis not present

## 2019-09-12 DIAGNOSIS — G4733 Obstructive sleep apnea (adult) (pediatric): Secondary | ICD-10-CM

## 2019-09-12 NOTE — Progress Notes (Signed)
Location:  Occupational psychologist of Service:  Clinic (12)  Provider: Printice Hellmer L. [name], D.O., C.M.D.  Code Status: DNR Goals of Care:  Advanced Directives 09/12/2019  Does Patient Have a Medical Advance Directive? Yes  Type of Paramedic of [place];Out of facility DNR (pink MOST or yellow form)  Does patient want to make changes to medical advance directive? No - Patient declined  Copy of [place] in Chart? Yes - validated most recent copy scanned in chart (See row information)  Pre-existing out of facility DNR order (yellow form or pink MOST form) Pink MOST/Yellow Form most recent copy in chart - Physician notified to receive inpatient order     Chief Complaint  Patient presents with  . Medical Management of Chronic Issues    3 month follow up     HPI: Patient is a 79 y.o. female seen today for medical management of chronic diseases.  She has considerable dementia due to cerebral amyloid angiopathy, depression, anxiety, prior OSA dx, h/o melanoma, fuchs' dystrophy, glaucoma, hyperlipidemia.  Pt is no longer going to use CPAP anymore.  Pt feels well.  She has been taking it off in her sleep.  She saw Dr. Rexene Alberts about this and they agreed to stop it.  Left leg has a scab on it with erythema around it.  It's not warm.  Pt says it's just ugly.    She is missing her trip to [place] today to come to this appt.    She has not had labs in over a year.    Past Medical History:  Diagnosis Date  . Anxiety    Per [facility] New Patient Packet   . Bleb x 4   . Depression    Per [facility] New Patient Packet   . Dermatitis due to solar radiation   . Dyshidrosis 05/03/2007  . Fuchs' corneal dystrophy    Per [facility] New Patient Packet   . Glaucoma   . Heart murmur   . Hyperlipidemia    Per [facility] New Patient Packet   . Malignant melanoma ([place])    Per [facility] New Patient Packet   . Osteopenia   . Pernicious anemia    Per [facility] New Patient  Packet   . Tinnitus     Past Surgical History:  Procedure Laterality Date  . CORNEAL TRANSPLANT     Dr.[facility], Per [place] New Patient Packet   . MASTECTOMY    . PLACEMENT OF BREAST IMPLANTS      Allergies  Allergen Reactions  . Sulfonamide Derivatives Other (See Comments)    Doesn't remember    Outpatient Encounter Medications as of 09/12/2019  Medication Sig  . aspirin EC 81 MG tablet Take 81 mg by mouth daily.  . cyanocobalamin (,VITAMIN B-12,) 1000 MCG/ML injection INJECT 32mL MONTHLY  . LORazepam (ATIVAN) 0.5 MG tablet Take 1 tablet (0.5 mg total) by mouth daily as needed for anxiety.  Marland Kitchen LUMIGAN 0.01 % SOLN Place 1 drop into both eyes daily.   . mirtazapine (REMERON) 7.5 MG tablet Take 1 tablet (7.5 mg total) by mouth at bedtime.  . pravastatin (PRAVACHOL) 10 MG tablet TAKE ONE TABLET DAILY  . [DISCONTINUED] NEEDLE, DISP, 27 G (BD ECLIPSE NEEDLE) 27G X 1/2" MISC 1 mL by Does not apply route every 30 (thirty) days.   No facility-administered encounter medications on file as of 09/12/2019.    Review of Systems:  Review of Systems  Constitutional: Negative  for chills and fever.  HENT: Negative for congestion.   Eyes: Negative for blurred vision.  Respiratory: Negative for cough and shortness of breath.   Cardiovascular: Negative for chest pain, palpitations and leg swelling.       Very minimal swelling of top of left foot, no longer painful  Gastrointestinal: Negative for abdominal pain and constipation.  Genitourinary: Negative for dysuria.  Musculoskeletal: Negative for falls and joint pain.  Skin:       Left lower medial shin has black scab from cryotherapy and surrounding scaly red erythema, nose and forehead have healed  Neurological: Negative for dizziness and loss of consciousness.  Psychiatric/Behavioral: Positive for memory loss. Negative for depression. The patient is not nervous/anxious and does not have insomnia.     Health Maintenance  Topic Date Due  .  Hepatitis C Screening  Never done  . INFLUENZA VACCINE  09/09/2019  . TETANUS/TDAP  10/19/2020  . DEXA SCAN  Completed  . COVID-19 Vaccine  Completed  . PNA vac Low Risk Adult  Completed    Physical Exam: Vitals:   09/12/19 1408  BP: 122/82  Pulse: 93  Temp: 97.8 F (36.6 C)  TempSrc: Temporal  SpO2: 97%  Weight: 129 lb (58.5 kg)  Height: 5\' 2"  (1.575 m)   Body mass index is 23.59 kg/m. Physical Exam Vitals reviewed.  Constitutional:      Appearance: Normal appearance.  HENT:     Head: Normocephalic and atraumatic.  Cardiovascular:     Rate and Rhythm: Normal rate and regular rhythm.     Heart sounds: No murmur heard.  No friction rub. No gallop.   Pulmonary:     Effort: Pulmonary effort is normal.     Breath sounds: Normal breath sounds. No wheezing, rhonchi or rales.  Musculoskeletal:        General: Normal range of motion.     Comments: Touch of nonpitting edema on dorsum of left foot  Skin:    Comments: Left medial lower leg with pencil eraser sized scab, surrounding erythema that is scaly, nontender, not warm, no drainage  Neurological:     General: No focal deficit present.     Mental Status: She is alert.     Comments: Repeats herself over 2 mins during appt  Psychiatric:        Mood and Affect: Mood normal.     Comments: Could not locate her room in [place]--had room number wrong today     Labs reviewed: Basic Metabolic Panel: No results for input(s): NA, K, CL, CO2, GLUCOSE, BUN, CREATININE, CALCIUM, MG, PHOS, TSH in the last 8760 hours. Liver Function Tests: No results for input(s): AST, ALT, ALKPHOS, BILITOT, PROT, ALBUMIN in the last 8760 hours. No results for input(s): LIPASE, AMYLASE in the last 8760 hours. No results for input(s): AMMONIA in the last 8760 hours. CBC: No results for input(s): WBC, NEUTROABS, HGB, HCT, MCV, PLT in the last 8760 hours. Lipid Panel: No results for input(s): CHOL, HDL, LDLCALC, TRIG, CHOLHDL, LDLDIRECT in the last 8760  hours. No results found for: HGBA1C  Procedures since last visit: No results found.  Assessment/Plan 1. Major neurocognitive disorder, due to another medical condition, without behavioral disturbance, moderate ([place]) -continues to progress though less notably since living in [place] (from my perspective has been about the same since then) -gets easily lost/disoriented -short-term memory very poor  2. Cerebral amyloid angiopathy (CODE) -cause of #1  3. Actinic keratosis -had cryotherapy of several placed with onsite  derm -left medial lower leg appears to have considerable surrounding erythema, but not warm or tender--suspect actually squamous cell ca -apply vaseline which she's not been doing daily -if not looking better by next time   4. OSA (obstructive sleep apnea) -no longer going to use cpap since she pulls it off in her sleep  5. Panic attacks -seem much better since move to [place], loves socializing and was upset she missed her trip to [employer]  6. Pure hypercholesterolemia -cont statin therapy, f/u flp  Labs/tests ordered:  Cbc, cmp, flp, b12 next draw Next appt:  4 mos med mgt  Celicia Minahan L. Buna Cuppett, D.O. [facility] [address], [place] [address] Cell Phone (Mon-Fri 8am-5pm):  [phone] On Call:  [phone] & follow prompts after 5pm & weekends Office Phone:  [phone] Office Fax:  [phone]

## 2019-09-13 DIAGNOSIS — I680 Cerebral amyloid angiopathy: Secondary | ICD-10-CM | POA: Diagnosis not present

## 2019-09-13 DIAGNOSIS — F0280 Dementia in other diseases classified elsewhere without behavioral disturbance: Secondary | ICD-10-CM | POA: Diagnosis not present

## 2019-09-13 DIAGNOSIS — D510 Vitamin B12 deficiency anemia due to intrinsic factor deficiency: Secondary | ICD-10-CM | POA: Diagnosis not present

## 2019-09-13 DIAGNOSIS — E785 Hyperlipidemia, unspecified: Secondary | ICD-10-CM | POA: Diagnosis not present

## 2019-09-13 DIAGNOSIS — D649 Anemia, unspecified: Secondary | ICD-10-CM | POA: Diagnosis not present

## 2019-09-24 DIAGNOSIS — Z20828 Contact with and (suspected) exposure to other viral communicable diseases: Secondary | ICD-10-CM | POA: Diagnosis not present

## 2019-09-24 DIAGNOSIS — Z9189 Other specified personal risk factors, not elsewhere classified: Secondary | ICD-10-CM | POA: Diagnosis not present

## 2019-10-01 DIAGNOSIS — Z20828 Contact with and (suspected) exposure to other viral communicable diseases: Secondary | ICD-10-CM | POA: Diagnosis not present

## 2019-10-01 DIAGNOSIS — Z9189 Other specified personal risk factors, not elsewhere classified: Secondary | ICD-10-CM | POA: Diagnosis not present

## 2019-10-16 ENCOUNTER — Encounter: Payer: Self-pay | Admitting: Internal Medicine

## 2019-10-24 DIAGNOSIS — L821 Other seborrheic keratosis: Secondary | ICD-10-CM | POA: Diagnosis not present

## 2019-10-24 DIAGNOSIS — L570 Actinic keratosis: Secondary | ICD-10-CM | POA: Diagnosis not present

## 2019-10-24 DIAGNOSIS — Z85068 Personal history of other malignant neoplasm of small intestine: Secondary | ICD-10-CM | POA: Diagnosis not present

## 2019-10-24 DIAGNOSIS — Z85820 Personal history of malignant melanoma of skin: Secondary | ICD-10-CM | POA: Diagnosis not present

## 2019-12-11 LAB — HM MAMMOGRAPHY

## 2019-12-14 ENCOUNTER — Encounter: Payer: Self-pay | Admitting: Internal Medicine

## 2019-12-22 DIAGNOSIS — Z23 Encounter for immunization: Secondary | ICD-10-CM | POA: Diagnosis not present

## 2020-01-09 ENCOUNTER — Non-Acute Institutional Stay: Payer: Medicare Other | Admitting: Internal Medicine

## 2020-01-09 ENCOUNTER — Other Ambulatory Visit: Payer: Self-pay

## 2020-01-09 ENCOUNTER — Encounter: Payer: Self-pay | Admitting: Internal Medicine

## 2020-01-09 VITALS — BP 122/78 | HR 85 | Temp 97.2°F | Ht 62.0 in | Wt 128.8 lb

## 2020-01-09 DIAGNOSIS — F410 Panic disorder [episodic paroxysmal anxiety] without agoraphobia: Secondary | ICD-10-CM | POA: Diagnosis not present

## 2020-01-09 DIAGNOSIS — F0280 Dementia in other diseases classified elsewhere without behavioral disturbance: Secondary | ICD-10-CM

## 2020-01-09 DIAGNOSIS — I680 Cerebral amyloid angiopathy: Secondary | ICD-10-CM

## 2020-01-09 DIAGNOSIS — F4321 Adjustment disorder with depressed mood: Secondary | ICD-10-CM

## 2020-01-09 DIAGNOSIS — F02B0 Dementia in other diseases classified elsewhere, moderate, without behavioral disturbance, psychotic disturbance, mood disturbance, and anxiety: Secondary | ICD-10-CM

## 2020-01-09 NOTE — Progress Notes (Signed)
Location:  Occupational psychologist of Service:  Clinic (12)  Provider: Symone Cornman L. [name], D.O., C.M.D.  Code Status: DNR Goals of Care:  Advanced Directives 01/09/2020  Does Patient Have a Medical Advance Directive? Yes  Type of Paramedic of [place];Out of facility DNR (pink MOST or yellow form)  Does patient want to make changes to medical advance directive? No - Patient declined  Copy of [place] in Chart? Yes - validated most recent copy scanned in chart (See row information)  Pre-existing out of facility DNR order (yellow form or pink MOST form) Pink MOST/Yellow Form most recent copy in chart - Physician notified to receive inpatient order     Chief Complaint  Patient presents with  . Medical Management of Chronic Issues    4 month follow up     HPI: Patient is a 79 y.o. female seen today for medical management of chronic diseases.    Derm looked at area under her chin and said it was a ketosis.  They're going to leave it alone.    No pains.    She remains busy.  Sleeping not as soundly at night.  Says she hears them at the nurses' station at night/early morning.  She's always been an early riser.  She goes to be earlier than she used to.  She occasionally calls her daughter early morning but less often.  Not coming out of her room at night.  She says she's been in good spirits, but when [name] asks if she's having periods of sadness--she sometimes says there's not a lot going on--feels lonely.  Has periods where she fixates on that.  She does all of the activities.    No difficulty with bowels or bladder.   They've tried different things to fill her downtime--tv does not hold her attention, does get the newspaper to read.  She struggles more on the weekends when her normal activities staff are not here.  She does have a private caregiver come two days a week to take her out.  She also has someone from [facility] two days per  week to keep her occupied and to get her hair done.  That's a new person now this week.  She's going to see her other daughter for a few days at Christmas--she'll have company to go.  Her son is taking her to the gate and Jeral Fruit will meet her at the other gate and someone will be aware on the flight, too.       Past Medical History:  Diagnosis Date  . Anxiety    Per [facility] New Patient Packet   . Bleb x 4   . Depression    Per [facility] New Patient Packet   . Dermatitis due to solar radiation   . Dyshidrosis 05/03/2007  . Fuchs' corneal dystrophy    Per [facility] New Patient Packet   . Glaucoma   . Heart murmur   . Hyperlipidemia    Per [facility] New Patient Packet   . Malignant melanoma ([place])    Per [facility] New Patient Packet   . Osteopenia   . Pernicious anemia    Per [facility] New Patient Packet   . Tinnitus     Past Surgical History:  Procedure Laterality Date  . CORNEAL TRANSPLANT     Dr.[facility], Per [facility] New Patient Packet   . MASTECTOMY    . PLACEMENT OF BREAST IMPLANTS      Allergies  Allergen Reactions  . Sulfonamide Derivatives Other (See Comments)    Doesn't remember    Outpatient Encounter Medications as of 01/09/2020  Medication Sig  . aspirin EC 81 MG tablet Take 81 mg by mouth daily.  . cyanocobalamin (,VITAMIN B-12,) 1000 MCG/ML injection INJECT 50mL MONTHLY  . LORazepam (ATIVAN) 0.5 MG tablet Take 1 tablet (0.5 mg total) by mouth daily as needed for anxiety.  Marland Kitchen LUMIGAN 0.01 % SOLN Place 1 drop into both eyes daily.   . mirtazapine (REMERON) 7.5 MG tablet Take 1 tablet (7.5 mg total) by mouth at bedtime.  . pravastatin (PRAVACHOL) 10 MG tablet TAKE ONE TABLET DAILY   No facility-administered encounter medications on file as of 01/09/2020.    Review of Systems:  Review of Systems  Constitutional: Negative for chills, fever and malaise/fatigue.  HENT: Negative for congestion and sore throat.   Eyes: Negative for blurred vision.  Respiratory: Negative for cough and shortness of  breath.   Cardiovascular: Negative for chest pain, palpitations and leg swelling.  Gastrointestinal: Negative for abdominal pain and constipation.  Genitourinary: Negative for dysuria.  Musculoskeletal: Negative for falls and joint pain.  Neurological: Negative for dizziness and loss of consciousness.  Psychiatric/Behavioral: Positive for depression and memory loss. The patient is nervous/anxious and has insomnia.     Health Maintenance  Topic Date Due  . Hepatitis C Screening  Never done  . TETANUS/TDAP  10/19/2020  . INFLUENZA VACCINE  Completed  . DEXA SCAN  Completed  . COVID-19 Vaccine  Completed  . PNA vac Low Risk Adult  Completed    Physical Exam: Vitals:   01/09/20 1143  BP: 122/78  Pulse: 85  Temp: (!) 97.2 F (36.2 C)  SpO2: 90%  Weight: 128 lb 12.8 oz (58.4 kg)  Height: 5\' 2"  (1.575 m)   Body mass index is 23.56 kg/m. Physical Exam Vitals reviewed.  Constitutional:      General: She is not in acute distress.    Appearance: Normal appearance. She is not toxic-appearing.  HENT:     Head: Normocephalic and atraumatic.  Eyes:     Conjunctiva/sclera: Conjunctivae normal.     Pupils: Pupils are equal, round, and reactive to light.  Cardiovascular:     Rate and Rhythm: Normal rate and regular rhythm.     Heart sounds: No murmur heard.   Pulmonary:     Effort: Pulmonary effort is normal.     Breath sounds: Normal breath sounds.  Abdominal:     General: Bowel sounds are normal.  Musculoskeletal:        General: Normal range of motion.     Right lower leg: No edema.     Left lower leg: No edema.  Skin:    General: Skin is warm and dry.  Neurological:     Mental Status: She is alert. Mental status is at baseline.     Motor: No weakness.     Gait: Gait abnormal.     Comments: kyphosis  Psychiatric:        Mood and Affect: Mood normal.     Labs reviewed: Basic Metabolic Panel: No results for input(s): NA, K, CL, CO2, GLUCOSE, BUN, CREATININE,  CALCIUM, MG, PHOS, TSH in the last 8760 hours. Liver Function Tests: No results for input(s): AST, ALT, ALKPHOS, BILITOT, PROT, ALBUMIN in the last 8760 hours. No results for input(s): LIPASE, AMYLASE in the last 8760 hours. No results for input(s): AMMONIA in the last 8760 hours. CBC: No results  for input(s): WBC, NEUTROABS, HGB, HCT, MCV, PLT in the last 8760 hours. Lipid Panel: No results for input(s): CHOL, HDL, LDLCALC, TRIG, CHOLHDL, LDLDIRECT in the last 8760 hours. No results found for: HGBA1C  Procedures since last visit: No results found.  Assessment/Plan 1. Major neurocognitive disorder, due to another medical condition, without behavioral disturbance, moderate ([place]) -continues to progress, remains ambulatory but requires a lot of cuing, redirection and not safe to leave her unit without assistance due lack of insight and poor memory -family did not want to move her to memory care so quickly after her move to [place]  2. Cerebral amyloid angiopathy (CODE) -cause of #1  3. Panic attacks -no major ones reported lately  4. Situational depression -ongoing, does best when kept continuously occupied, but this is challenging for staff and family to do   Labs/tests ordered:  No new, but as I finish this, it's looking like last labs were almost a year ago unless they were not abstracted--will check with [name]  Next appt:  05/21/2020   Jaja Switalski L. Mosi Hannold, D.O. [facility] [address], [place] [address] Cell Phone (Mon-Fri 8am-5pm):  [phone] On Call:  [phone] & follow prompts after 5pm & weekends Office Phone:  [phone] Office Fax:  [phone]

## 2020-01-23 DIAGNOSIS — L570 Actinic keratosis: Secondary | ICD-10-CM | POA: Diagnosis not present

## 2020-01-23 DIAGNOSIS — Z85820 Personal history of malignant melanoma of skin: Secondary | ICD-10-CM | POA: Diagnosis not present

## 2020-01-23 DIAGNOSIS — L821 Other seborrheic keratosis: Secondary | ICD-10-CM | POA: Diagnosis not present

## 2020-01-23 DIAGNOSIS — Z85068 Personal history of other malignant neoplasm of small intestine: Secondary | ICD-10-CM | POA: Diagnosis not present

## 2020-02-17 ENCOUNTER — Inpatient Hospital Stay (HOSPITAL_COMMUNITY)
Admission: EM | Admit: 2020-02-17 | Discharge: 2020-02-19 | DRG: 065 | Disposition: A | Discharge status: Skilled Nursing Facility | Payer: Medicare Other | Attending: Family Medicine | Admitting: Family Medicine

## 2020-02-17 ENCOUNTER — Inpatient Hospital Stay (HOSPITAL_COMMUNITY): Payer: Medicare Other

## 2020-02-17 ENCOUNTER — Emergency Department (HOSPITAL_COMMUNITY): Payer: Medicare Other

## 2020-02-17 ENCOUNTER — Other Ambulatory Visit: Payer: Self-pay

## 2020-02-17 ENCOUNTER — Encounter (HOSPITAL_COMMUNITY): Payer: Self-pay

## 2020-02-17 DIAGNOSIS — I6389 Other cerebral infarction: Secondary | ICD-10-CM | POA: Diagnosis not present

## 2020-02-17 DIAGNOSIS — Z85820 Personal history of malignant melanoma of skin: Secondary | ICD-10-CM | POA: Diagnosis not present

## 2020-02-17 DIAGNOSIS — Z882 Allergy status to sulfonamides status: Secondary | ICD-10-CM | POA: Diagnosis not present

## 2020-02-17 DIAGNOSIS — F0390 Unspecified dementia without behavioral disturbance: Secondary | ICD-10-CM | POA: Diagnosis present

## 2020-02-17 DIAGNOSIS — Z66 Do not resuscitate: Secondary | ICD-10-CM | POA: Diagnosis present

## 2020-02-17 DIAGNOSIS — E785 Hyperlipidemia, unspecified: Secondary | ICD-10-CM | POA: Diagnosis present

## 2020-02-17 DIAGNOSIS — F02B0 Dementia in other diseases classified elsewhere, moderate, without behavioral disturbance, psychotic disturbance, mood disturbance, and anxiety: Secondary | ICD-10-CM | POA: Diagnosis present

## 2020-02-17 DIAGNOSIS — R471 Dysarthria and anarthria: Secondary | ICD-10-CM | POA: Diagnosis present

## 2020-02-17 DIAGNOSIS — R29810 Facial weakness: Secondary | ICD-10-CM | POA: Diagnosis not present

## 2020-02-17 DIAGNOSIS — I2510 Atherosclerotic heart disease of native coronary artery without angina pectoris: Secondary | ICD-10-CM | POA: Diagnosis present

## 2020-02-17 DIAGNOSIS — M8580 Other specified disorders of bone density and structure, unspecified site: Secondary | ICD-10-CM | POA: Diagnosis present

## 2020-02-17 DIAGNOSIS — I6529 Occlusion and stenosis of unspecified carotid artery: Secondary | ICD-10-CM | POA: Diagnosis not present

## 2020-02-17 DIAGNOSIS — H748X3 Other specified disorders of middle ear and mastoid, bilateral: Secondary | ICD-10-CM | POA: Diagnosis not present

## 2020-02-17 DIAGNOSIS — I6381 Other cerebral infarction due to occlusion or stenosis of small artery: Principal | ICD-10-CM | POA: Diagnosis present

## 2020-02-17 DIAGNOSIS — R29704 NIHSS score 4: Secondary | ICD-10-CM | POA: Diagnosis present

## 2020-02-17 DIAGNOSIS — D510 Vitamin B12 deficiency anemia due to intrinsic factor deficiency: Secondary | ICD-10-CM | POA: Diagnosis present

## 2020-02-17 DIAGNOSIS — F419 Anxiety disorder, unspecified: Secondary | ICD-10-CM | POA: Diagnosis present

## 2020-02-17 DIAGNOSIS — R531 Weakness: Secondary | ICD-10-CM | POA: Diagnosis not present

## 2020-02-17 DIAGNOSIS — Z803 Family history of malignant neoplasm of breast: Secondary | ICD-10-CM

## 2020-02-17 DIAGNOSIS — R4701 Aphasia: Secondary | ICD-10-CM | POA: Diagnosis present

## 2020-02-17 DIAGNOSIS — I10 Essential (primary) hypertension: Secondary | ICD-10-CM | POA: Diagnosis present

## 2020-02-17 DIAGNOSIS — H409 Unspecified glaucoma: Secondary | ICD-10-CM | POA: Diagnosis present

## 2020-02-17 DIAGNOSIS — Z20822 Contact with and (suspected) exposure to covid-19: Secondary | ICD-10-CM | POA: Diagnosis present

## 2020-02-17 DIAGNOSIS — Z7982 Long term (current) use of aspirin: Secondary | ICD-10-CM | POA: Diagnosis not present

## 2020-02-17 DIAGNOSIS — F0280 Dementia in other diseases classified elsewhere without behavioral disturbance: Secondary | ICD-10-CM

## 2020-02-17 DIAGNOSIS — Z79899 Other long term (current) drug therapy: Secondary | ICD-10-CM | POA: Diagnosis not present

## 2020-02-17 DIAGNOSIS — J9811 Atelectasis: Secondary | ICD-10-CM | POA: Diagnosis not present

## 2020-02-17 DIAGNOSIS — G4733 Obstructive sleep apnea (adult) (pediatric): Secondary | ICD-10-CM | POA: Diagnosis present

## 2020-02-17 DIAGNOSIS — R29818 Other symptoms and signs involving the nervous system: Secondary | ICD-10-CM | POA: Diagnosis not present

## 2020-02-17 DIAGNOSIS — E854 Organ-limited amyloidosis: Secondary | ICD-10-CM | POA: Diagnosis present

## 2020-02-17 DIAGNOSIS — I680 Cerebral amyloid angiopathy: Secondary | ICD-10-CM | POA: Diagnosis present

## 2020-02-17 DIAGNOSIS — R21 Rash and other nonspecific skin eruption: Secondary | ICD-10-CM | POA: Diagnosis not present

## 2020-02-17 DIAGNOSIS — R000 Tachycardia, unspecified: Secondary | ICD-10-CM | POA: Diagnosis not present

## 2020-02-17 DIAGNOSIS — I639 Cerebral infarction, unspecified: Secondary | ICD-10-CM | POA: Diagnosis present

## 2020-02-17 DIAGNOSIS — G319 Degenerative disease of nervous system, unspecified: Secondary | ICD-10-CM | POA: Diagnosis not present

## 2020-02-17 DIAGNOSIS — I444 Left anterior fascicular block: Secondary | ICD-10-CM | POA: Diagnosis present

## 2020-02-17 DIAGNOSIS — R4702 Dysphasia: Secondary | ICD-10-CM | POA: Diagnosis not present

## 2020-02-17 DIAGNOSIS — D1779 Benign lipomatous neoplasm of other sites: Secondary | ICD-10-CM | POA: Diagnosis not present

## 2020-02-17 LAB — I-STAT CHEM 8, ED
BUN: 19 mg/dL (ref 8–23)
Calcium, Ion: 1.22 mmol/L (ref 1.15–1.40)
Chloride: 107 mmol/L (ref 98–111)
Creatinine, Ser: 0.7 mg/dL (ref 0.44–1.00)
Glucose, Bld: 132 mg/dL — ABNORMAL HIGH (ref 70–99)
HCT: 49 % — ABNORMAL HIGH (ref 36.0–46.0)
Hemoglobin: 16.7 g/dL — ABNORMAL HIGH (ref 12.0–15.0)
Potassium: 3.7 mmol/L (ref 3.5–5.1)
Sodium: 143 mmol/L (ref 135–145)
TCO2: 27 mmol/L (ref 22–32)

## 2020-02-17 LAB — RESP PANEL BY RT-PCR (FLU A&B, COVID) ARPGX2
Influenza A by PCR: NEGATIVE
Influenza B by PCR: NEGATIVE
SARS Coronavirus 2 by RT PCR: NEGATIVE

## 2020-02-17 LAB — CBC
HCT: 47.7 % — ABNORMAL HIGH (ref 36.0–46.0)
Hemoglobin: 14.7 g/dL (ref 12.0–15.0)
MCH: 27 pg (ref 26.0–34.0)
MCHC: 30.8 g/dL (ref 30.0–36.0)
MCV: 87.7 fL (ref 80.0–100.0)
Platelets: 255 10*3/uL (ref 150–400)
RBC: 5.44 MIL/uL — ABNORMAL HIGH (ref 3.87–5.11)
RDW: 13.2 % (ref 11.5–15.5)
WBC: 6 10*3/uL (ref 4.0–10.5)
nRBC: 0 % (ref 0.0–0.2)

## 2020-02-17 LAB — COMPREHENSIVE METABOLIC PANEL
ALT: 31 U/L (ref 0–44)
AST: 31 U/L (ref 15–41)
Albumin: 4.5 g/dL (ref 3.5–5.0)
Alkaline Phosphatase: 87 U/L (ref 38–126)
Anion gap: 12 (ref 5–15)
BUN: 16 mg/dL (ref 8–23)
CO2: 23 mmol/L (ref 22–32)
Calcium: 9.4 mg/dL (ref 8.9–10.3)
Chloride: 106 mmol/L (ref 98–111)
Creatinine, Ser: 0.78 mg/dL (ref 0.44–1.00)
GFR, Estimated: >60 mL/min (ref >60)
Glucose, Bld: 140 mg/dL — ABNORMAL HIGH (ref 70–99)
Potassium: 3.9 mmol/L (ref 3.5–5.1)
Sodium: 141 mmol/L (ref 135–145)
Total Bilirubin: <0.1 mg/dL — ABNORMAL LOW (ref 0.3–1.2)
Total Protein: 7.8 g/dL (ref 6.5–8.1)

## 2020-02-17 LAB — DIFFERENTIAL
Abs Immature Granulocytes: 0.01 10*3/uL (ref 0.00–0.07)
Basophils Absolute: 0 10*3/uL (ref 0.0–0.1)
Basophils Relative: 1 %
Eosinophils Absolute: 0.1 10*3/uL (ref 0.0–0.5)
Eosinophils Relative: 1 %
Immature Granulocytes: 0 %
Lymphocytes Relative: 17 %
Lymphs Abs: 1 10*3/uL (ref 0.7–4.0)
Monocytes Absolute: 0.4 10*3/uL (ref 0.1–1.0)
Monocytes Relative: 7 %
Neutro Abs: 4.5 10*3/uL (ref 1.7–7.7)
Neutrophils Relative %: 74 %

## 2020-02-17 LAB — CBG MONITORING, ED: Glucose-Capillary: 102 mg/dL — ABNORMAL HIGH (ref 70–99)

## 2020-02-17 LAB — APTT: aPTT: 30 seconds (ref 24–36)

## 2020-02-17 LAB — PROTIME-INR
INR: 0.9 (ref 0.8–1.2)
Prothrombin Time: 11.6 seconds (ref 11.4–15.2)

## 2020-02-17 MED ORDER — ACETAMINOPHEN 160 MG/5ML PO SOLN: 650.0000 mg | ORAL | Status: DC | PRN

## 2020-02-17 MED ORDER — ACETAMINOPHEN 325 MG PO TABS: 650.0000 mg | ORAL_TABLET | ORAL | Status: DC | PRN

## 2020-02-17 MED ORDER — LORAZEPAM 0.5 MG PO TABS
0.5000 mg | ORAL_TABLET | Freq: Every day | ORAL | Status: DC | PRN
  Administered 2020-02-18: 0.5 mg via ORAL
  Filled 2020-02-17 (×3): qty 1

## 2020-02-17 MED ORDER — LABETALOL HCL 5 MG/ML IV SOLN: 5.0000 mg | INTRAVENOUS | Status: DC | PRN

## 2020-02-17 MED ORDER — MIRTAZAPINE 15 MG PO TABS
7.5000 mg | ORAL_TABLET | Freq: Every day | ORAL | Status: DC
  Filled 2020-02-17 (×2): qty 1

## 2020-02-17 MED ORDER — PRAVASTATIN SODIUM 10 MG PO TABS
10.0000 mg | ORAL_TABLET | Freq: Every day | ORAL | Status: DC
  Administered 2020-02-18: 10 mg via ORAL
  Filled 2020-02-17 (×2): qty 1

## 2020-02-17 MED ORDER — IOHEXOL 350 MG/ML SOLN
115.0000 mL | Freq: Once | INTRAVENOUS | Status: AC | PRN
  Administered 2020-02-17: 115 mL via INTRAVENOUS

## 2020-02-17 MED ORDER — ACETAMINOPHEN 650 MG RE SUPP: 650.0000 mg | RECTAL | Status: DC | PRN

## 2020-02-17 MED ORDER — SODIUM CHLORIDE 0.9% FLUSH
3.0000 mL | Freq: Once | INTRAVENOUS | Status: AC
Start: 2020-02-17 — End: 2020-02-17
  Administered 2020-02-17: 3 mL via INTRAVENOUS

## 2020-02-17 MED ORDER — LORAZEPAM 2 MG/ML IJ SOLN
1.0000 mg | Freq: Once | INTRAMUSCULAR | Status: AC | PRN
  Administered 2020-02-17: 1 mg via INTRAVENOUS
  Filled 2020-02-17: qty 1

## 2020-02-17 MED ORDER — LATANOPROST 0.005 % OP SOLN
1.0000 [drp] | Freq: Every day | OPHTHALMIC | Status: DC
  Filled 2020-02-17: qty 2.5

## 2020-02-17 MED ORDER — STROKE: EARLY STAGES OF RECOVERY BOOK
Freq: Once | Status: AC
  Filled 2020-02-17: qty 1

## 2020-02-17 MED ORDER — SENNOSIDES-DOCUSATE SODIUM 8.6-50 MG PO TABS: 1.0000 | ORAL_TABLET | Freq: Every evening | ORAL | Status: DC | PRN

## 2020-02-17 NOTE — ED Notes (Signed)
Patient transported to CT 

## 2020-02-17 NOTE — ED Provider Notes (Signed)
[facility] DEPARTMENT Provider Note   CSN: [id number] Arrival date & time: 02/17/20  1306     History No chief complaint on file.   Carmen [name] is a 80 y.o. female.  80 yo F with a chief complaints of acute onset aphasia.  This was noticed by the family this morning.  Last known well last night.  Having trouble expressing herself and finding certain words.  No head injury no headache no one-sided numbness or weakness no difficulty with swallowing.  Denies recent illness denies cough congestion or fever denies nausea vomiting or diarrhea.  The history is provided by the patient and a relative.  Illness Severity:  Severe Onset quality:  Sudden Duration:  1 day Timing:  Constant Progression:  Unchanged Chronicity:  New Associated symptoms: no chest pain, no congestion, no fever, no headaches, no myalgias, no nausea, no rhinorrhea, no shortness of breath, no vomiting and no wheezing        Past Medical History:  Diagnosis Date  . Anxiety    Per [facility] New Patient Packet   . Bleb x 4   . Depression    Per [facility] New Patient Packet   . Dermatitis due to solar radiation   . Dyshidrosis 05/03/2007  . Fuchs' corneal dystrophy    Per [facility] New Patient Packet   . Glaucoma   . Heart murmur   . Hyperlipidemia    Per [facility] New Patient Packet   . Malignant melanoma ([place])    Per [facility] New Patient Packet   . Osteopenia   . Pernicious anemia    Per [place] New Patient Packet   . Tinnitus     Patient Active Problem List   Diagnosis Date Noted  . Major neurocognitive disorder, due to another medical condition, without behavioral disturbance, moderate ([place]) 03/03/2019  . Panic attacks 12/09/2018  . Cerebral amyloid angiopathy (CODE) 12/09/2018  . OSA on CPAP 12/09/2018  . DNR (do not resuscitate) 12/09/2018  . Situational anxiety 03/07/2018  . Pernicious anemia 04/19/2017  . Situational depression 04/27/2016  . Contracture of palmar fascia 07/23/2015  .  History of malignant melanoma of skin 02/12/2014  . Cataract 10/23/2013  . Family history of breast cancer 10/23/2013  . Fuchs' corneal dystrophy 10/23/2013  . Glaucoma 10/23/2013  . Hyperlipidemia 03/21/2013  . Rhytides 12/12/2012    Past Surgical History:  Procedure Laterality Date  . CORNEAL TRANSPLANT     Dr.[facility], Per [place] New Patient Packet   . MASTECTOMY    . PLACEMENT OF BREAST IMPLANTS       OB History   No obstetric history on file.     Family History  Problem Relation Age of Onset  . Stroke Other   . Heart disease Other   . Breast cancer Other   . Breast cancer Mother     Social History   Tobacco Use  . Smoking status: Never Smoker  . Smokeless tobacco: Never Used  Vaping Use  . Vaping Use: Never used  Substance Use Topics  . Alcohol use: Yes    Comment: 1 drink weekly per [facility] New Patient Packet 12/05/2018   . Drug use: Never    Home Medications Prior to Admission medications   Medication Sig Start Date End Date Taking? Authorizing Provider  aspirin EC 81 MG tablet Take 81 mg by mouth daily.    [provider]  cyanocobalamin (,VITAMIN B-12,) 1000 MCG/ML injection INJECT 25mL MONTHLY 03/27/19   Hollace Kinnier  L, DO  LORazepam (ATIVAN) 0.5 MG tablet Take 1 tablet (0.5 mg total) by mouth daily as needed for anxiety. 05/16/19   Reed, Tiffany L, DO  LUMIGAN 0.01 % SOLN Place 1 drop into both eyes daily.  08/28/10   [provider]  mirtazapine (REMERON) 7.5 MG tablet Take 1 tablet (7.5 mg total) by mouth at bedtime. 03/28/19   Reed, Tiffany L, DO  pravastatin (PRAVACHOL) 10 MG tablet TAKE ONE TABLET DAILY 04/04/19   Reed, Tiffany L, DO    Allergies    Sulfonamide derivatives  Review of Systems   Review of Systems  Constitutional: Negative for chills and fever.  HENT: Negative for congestion and rhinorrhea.   Eyes: Negative for redness and visual disturbance.  Respiratory: Negative for shortness of breath and wheezing.    Cardiovascular: Negative for chest pain and palpitations.  Gastrointestinal: Negative for nausea and vomiting.  Genitourinary: Negative for dysuria and urgency.  Musculoskeletal: Negative for arthralgias and myalgias.  Skin: Negative for pallor and wound.  Neurological: Positive for speech difficulty. Negative for dizziness and headaches.    Physical Exam Updated Vital Signs BP (!) 152/87   Pulse (!) 103   Temp 99.2 F (37.3 C) (Oral)   Resp (!) 21   SpO2 97%   Physical Exam Vitals and nursing note reviewed.  Constitutional:      General: She is not in acute distress.    Appearance: She is well-developed and well-nourished. She is not diaphoretic.  HENT:     Head: Normocephalic and atraumatic.  Eyes:     Extraocular Movements: EOM normal.     Pupils: Pupils are equal, round, and reactive to light.  Cardiovascular:     Rate and Rhythm: Normal rate and regular rhythm.     Heart sounds: No murmur heard. No friction rub. No gallop.   Pulmonary:     Effort: Pulmonary effort is normal.     Breath sounds: No wheezing or rales.  Abdominal:     General: There is no distension.     Palpations: Abdomen is soft.     Tenderness: There is no abdominal tenderness.  Musculoskeletal:        General: No tenderness or edema.     Cervical back: Normal range of motion and neck supple.  Skin:    General: Skin is warm and dry.  Neurological:     Mental Status: She is alert and oriented to person, place, and time.     Cranial Nerves: Cranial nerves are intact.     Sensory: Sensation is intact.     Motor: Motor function is intact.     Coordination: Coordination is intact.     Gait: Gait is intact.     Comments: Some difficulty with naming and slow to respond to commands has some difficulty with finger-to-nose especially with the left upper extremity  Psychiatric:        Mood and Affect: Mood and affect normal.        Behavior: Behavior normal.     ED Results / Procedures /  Treatments   Labs (all labs ordered are listed, but only abnormal results are displayed) Labs Reviewed  CBC - Abnormal; Notable for the following components:      Result Value   RBC 5.44 (*)    HCT 47.7 (*)    All other components within normal limits  COMPREHENSIVE METABOLIC PANEL - Abnormal; Notable for the following components:   Glucose, Bld 140 (*)  Total Bilirubin <0.1 (*)    All other components within normal limits  I-STAT CHEM 8, ED - Abnormal; Notable for the following components:   Glucose, Bld 132 (*)    Hemoglobin 16.7 (*)    HCT 49.0 (*)    All other components within normal limits  CBG MONITORING, ED - Abnormal; Notable for the following components:   Glucose-Capillary 102 (*)    All other components within normal limits  RESP PANEL BY RT-PCR (FLU A&B, COVID) ARPGX2  PROTIME-INR  APTT  DIFFERENTIAL    EKG EKG Interpretation  Date/Time:  "Sunday February 17 2020 13:14:52 EST Ventricular Rate:  102 PR Interval:  150 QRS Duration: 84 QT Interval:  348 QTC Calculation: 453 R Axis:   -68 Text Interpretation: Sinus tachycardia Left anterior fascicular block Septal infarct , age undetermined Abnormal ECG No significant change since last tracing Confirmed by ,  ([id number]) on 02/17/2020 4:33:26 PM   Radiology CT HEAD WO CONTRAST  Result Date: 02/17/2020 CLINICAL DATA:  Expressive aphasia. EXAM: CT HEAD WITHOUT CONTRAST TECHNIQUE: Contiguous axial images were obtained from the base of the skull through the vertex without intravenous contrast. COMPARISON:  MRI brain dated March 18, 2017. FINDINGS: Brain: Punctate focus of hyperdensity in the right cerebellum (series 5, image 48; series 6, image 24). No evidence of acute infarction, hydrocephalus, extra-axial collection or mass lesion/mass effect. Stable atrophy and severe chronic microvascular ischemic changes. Vascular: Atherosclerotic vascular calcification of the carotid siphons. No hyperdense vessel. Skull:  Normal. Negative for fracture or focal lesion. Sinuses/Orbits: No acute finding. Other: None. IMPRESSION: 1. Punctate focus of hyperdensity in the right cerebellum, likely microhemorrhage related to patient's underlying severe amyloid angiopathy as demonstrated on prior MRI. 2. Stable atrophy and severe chronic microvascular ischemic changes. Electronically Signed   By: William T Derry M.D.   On: 02/17/2020 14:21    Procedures Procedures (including critical care time)  Medications Ordered in ED Medications  sodium chloride flush (NS) 0.9 % injection 3 mL (has no administration in time range)  LORazepam (ATIVAN) injection 1 mg (has no administration in time range)    ED Course  I have reviewed the triage vital signs and the nursing notes.  Pertinent labs & imaging results that were available during my care of the patient were reviewed by me and considered in my medical decision making (see chart for details).    MDM Rules/Calculators/A&P                          79"  yo F with a chief complaint of aphasia.  Was noticed about 10 AM this morning by the family.  No other obvious deficits on my exam.  Work-up in triage consistent for concern for microhemorrhage in the right cerebellum which was likely related to her prior seen amyloid angiopathy.  Will discuss with neurology.  Seen by Dr. Leonel Ramsay, felt to likely be stroke, recommends admission.   The patients results and plan were reviewed and discussed.   Any x-rays performed were independently reviewed by myself.   Differential diagnosis were considered with the presenting HPI.  Medications  sodium chloride flush (NS) 0.9 % injection 3 mL (has no administration in time range)  LORazepam (ATIVAN) injection 1 mg (has no administration in time range)    Vitals:   02/17/20 1725 02/17/20 1800 02/17/20 1830 02/17/20 1930  BP: (!) 156/91 (!) 166/93 (!) 129/105 (!) 152/87  Pulse: 94 98 96 (!) 103  Resp:  15 (!) 24 (!) 26 (!) 21  Temp:  99.2 F (37.3 C)     TempSrc: Oral     SpO2: 99% 94% 100% 97%    Final diagnoses:  Aphasia    Admission/ observation were discussed with the admitting physician, patient and/or family and they are comfortable with the plan.   Final Clinical Impression(s) / ED Diagnoses Final diagnoses:  Aphasia    Rx / DC Orders ED Discharge Orders    None       Deno Etienne, DO 02/17/20 1953

## 2020-02-17 NOTE — ED Triage Notes (Signed)
Patient arrived by [facility] from home. Patient was last seen well last night around 9-10pm. Today family checked on her and found to her to have expressive aphasia around noon. Patient also has anxiety and family reports that it occasionally presents with trouble speaking. No drift, no pain. No facial droop, can get some words out but appears slightly anxious

## 2020-02-17 NOTE — H&P (Signed)
History and Physical    Carmen [name] [id number] DOB: 06/12/1940 DOA: 02/17/2020  PCP: [name], DO  Patient coming from: SNF  I have personally briefly reviewed patient's old medical records in [place]  Chief Complaint: Aphasia  HPI: Carmen [name] is a 80 y.o. female with medical history significant of amyloid angiopathy, HLD.  Pt presents to the ED with new onset aphasia.  LKW when she spoke with children on phone last night.  This am when daughter talked with her at 10am noticed she wasn't talking correctly.  Symptoms constant since that point and persistent.  Symptoms severe.  Pt in to ED.  Pt unable to provide history secondary to aphasia.   ED Course: 1) 1.7cm acute ischemic stroke L frontal corona radiata. 2) Severe amyloid angiopathy 3) ? Punctate micro hemorrhage in cerebellum   Review of Systems: Unable to perform due to aphasia. Past Medical History:  Diagnosis Date  . Anxiety    Per [facility] New Patient Packet   . Bleb x 4   . Depression    Per [facility] New Patient Packet   . Dermatitis due to solar radiation   . Dyshidrosis 05/03/2007  . Fuchs' corneal dystrophy    Per [facility] New Patient Packet   . Glaucoma   . Heart murmur   . Hyperlipidemia    Per [facility] New Patient Packet   . Malignant melanoma ([place])    Per [facility] New Patient Packet   . Osteopenia   . Pernicious anemia    Per [facility] New Patient Packet   . Tinnitus     Past Surgical History:  Procedure Laterality Date  . CORNEAL TRANSPLANT     Dr.[facility], Per [place] New Patient Packet   . MASTECTOMY    . PLACEMENT OF BREAST IMPLANTS       reports that she has never smoked. She has never used smokeless tobacco. She reports current alcohol use. She reports that she does not use drugs.  Allergies  Allergen Reactions  . Sulfonamide Derivatives Other (See Comments)    Doesn't remember    Family History  Problem Relation Age of Onset  . Stroke Other   . Heart disease Other   . Breast  cancer Other   . Breast cancer Mother      Prior to Admission medications   Medication Sig Start Date End Date Taking? Authorizing Provider  aspirin EC 81 MG tablet Take 81 mg by mouth daily.   Yes [provider]  cyanocobalamin (,VITAMIN B-12,) 1000 MCG/ML injection INJECT 85mL MONTHLY Patient taking differently: Inject 1,000 mcg into the muscle every 30 (thirty) days. 15th of the month 03/27/19  Yes Reed, Tiffany L, DO  LUMIGAN 0.01 % SOLN Place 1 drop into both eyes daily.  08/28/10  Yes [provider]  mirtazapine (REMERON) 7.5 MG tablet Take 1 tablet (7.5 mg total) by mouth at bedtime. 03/28/19  Yes Reed, Tiffany L, DO  pravastatin (PRAVACHOL) 10 MG tablet TAKE ONE TABLET DAILY Patient taking differently: Take 10 mg by mouth daily. 04/04/19  Yes Reed, Tiffany L, DO  LORazepam (ATIVAN) 0.5 MG tablet Take 1 tablet (0.5 mg total) by mouth daily as needed for anxiety. 05/16/19   [name], DO    Physical Exam: Vitals:   02/17/20 1930 02/17/20 2000 02/17/20 2155 02/17/20 2201  BP: (!) 152/87 (!) 159/99    Pulse: (!) 103 (!) 103 84   Resp: (!) 21 (!) 24 (!) 21  Temp:      TempSrc:      SpO2: 97% 97% (!) 89% 96%    Constitutional: NAD, calm, comfortable Eyes: PERRL, lids and conjunctivae normal ENMT: Mucous membranes are moist. Posterior pharynx clear of any exudate or lesions.Normal dentition.  Neck: normal, supple, no masses, no thyromegaly Respiratory: clear to auscultation bilaterally, no wheezing, no crackles. Normal respiratory effort. No accessory muscle use.  Cardiovascular: Regular rate and rhythm, no murmurs / rubs / gallops. No extremity edema. 2+ pedal pulses. No carotid bruits.  Abdomen: no tenderness, no masses palpated. No hepatosplenomegaly. Bowel sounds positive.  Musculoskeletal: no clubbing / cyanosis. No joint deformity upper and lower extremities. Good ROM, no contractures. Normal muscle tone.  Skin: no rashes, lesions, ulcers. No  induration Neurologic: Difficulty naming objects, 5/5 strength in all extremities, slow to respond to commands.  Difficulty with finger to nose with LUE. Psychiatric: Not oriented to time.   Labs on Admission: I have personally reviewed following labs and imaging studies  CBC: Recent Labs  Lab 02/17/20 1322 02/17/20 1344  WBC 6.0  --   NEUTROABS 4.5  --   HGB 14.7 16.7*  HCT 47.7* 49.0*  MCV 87.7  --   PLT 255  --    Basic Metabolic Panel: Recent Labs  Lab 02/17/20 1322 02/17/20 1344  NA 141 143  K 3.9 3.7  CL 106 107  CO2 23  --   GLUCOSE 140* 132*  BUN 16 19  CREATININE 0.78 0.70  CALCIUM 9.4  --    GFR: CrCl cannot be calculated (Unknown ideal weight.). Liver Function Tests: Recent Labs  Lab 02/17/20 1322  AST 31  ALT 31  ALKPHOS 87  BILITOT <0.1*  PROT 7.8  ALBUMIN 4.5   No results for input(s): LIPASE, AMYLASE in the last 168 hours. No results for input(s): AMMONIA in the last 168 hours. Coagulation Profile: Recent Labs  Lab 02/17/20 1322  INR 0.9   Cardiac Enzymes: No results for input(s): CKTOTAL, CKMB, CKMBINDEX, TROPONINI in the last 168 hours. BNP (last 3 results) No results for input(s): PROBNP in the last 8760 hours. HbA1C: No results for input(s): HGBA1C in the last 72 hours. CBG: Recent Labs  Lab 02/17/20 1656  GLUCAP 102*   Lipid Profile: No results for input(s): CHOL, HDL, LDLCALC, TRIG, CHOLHDL, LDLDIRECT in the last 72 hours. Thyroid Function Tests: No results for input(s): TSH, T4TOTAL, FREET4, T3FREE, THYROIDAB in the last 72 hours. Anemia Panel: No results for input(s): VITAMINB12, FOLATE, FERRITIN, TIBC, IRON, RETICCTPCT in the last 72 hours. Urine analysis:    Component Value Date/Time   COLORURINE yellow 09/23/2009 0814   APPEARANCEUR Clear 09/23/2009 0814   LABSPEC 1.020 09/23/2009 0814   PHURINE 6.5 09/23/2009 0814   HGBUR negative 09/23/2009 0814   BILIRUBINUR n 04/11/2017 1231   PROTEINUR n 04/11/2017 1231    UROBILINOGEN 0.2 04/11/2017 1231   UROBILINOGEN 0.2 09/23/2009 0814   NITRITE n 04/11/2017 1231   NITRITE negative 09/23/2009 0814   LEUKOCYTESUR Negative 04/11/2017 1231    Radiological Exams on Admission: CT Angio Head W or Wo Contrast  Result Date: 02/17/2020 CLINICAL DATA:  Initial evaluation for neuro deficit, stroke suspected, expressive aphasia. EXAM: CT ANGIOGRAPHY HEAD AND NECK CT PERFUSION BRAIN TECHNIQUE: Multidetector CT imaging of the head and neck was performed using the standard protocol during bolus administration of intravenous contrast. Multiplanar CT image reconstructions and MIPs were obtained to evaluate the vascular anatomy. Carotid stenosis measurements (when applicable) are obtained  utilizing NASCET criteria, using the distal internal carotid diameter as the denominator. Multiphase CT imaging of the brain was performed following IV bolus contrast injection. Subsequent parametric perfusion maps were calculated using RAPID software. CONTRAST:  168mL OMNIPAQUE IOHEXOL 350 MG/ML SOLN COMPARISON:  Prior head CT from earlier the same day. FINDINGS: CTA NECK FINDINGS Aortic arch: Visualized aortic arch of normal caliber with normal branch pattern. No hemodynamically significant stenosis seen about the origin of the great vessels. Right carotid system: Right CCA patent from its origin to the bifurcation without stenosis. Mild atheromatous change about the right bifurcation without significant stenosis. Right ICA widely patent distally without stenosis, dissection or occlusion. Left carotid system: Left CCA patent from its origin to the bifurcation without stenosis. Mild atheromatous plaque about the left bifurcation without significant stenosis. Left ICA widely patent distally without stenosis, dissection or occlusion. Vertebral arteries: Both vertebral arteries arise from subclavian arteries. Vertebral arteries widely patent without stenosis, dissection or occlusion. Skeleton: No visible  acute osseous abnormality. No discrete or worrisome osseous lesions. Other neck: No other acute soft tissue abnormality within the neck. No mass or adenopathy. Upper chest: Visualized upper chest demonstrates no acute finding. Review of the MIP images confirms the above findings CTA HEAD FINDINGS Anterior circulation: Both internal carotid arteries widely patent to the termini without stenosis. A1 segments widely patent. Normal anterior communicating artery complex. Anterior cerebral arteries widely patent to their distal aspects. No M1 stenosis or occlusion. Normal MCA bifurcations. Distal MCA branches well perfused and symmetric. Posterior circulation: Both V4 segments widely patent to the vertebrobasilar junction. Both PICA origins patent and normal. Basilar widely patent to its distal aspect without stenosis. Superior cerebellar arteries patent bilaterally. Both PCAs primarily supplied via the basilar well perfused to their distal aspects. Venous sinuses: Not well assessed due to timing of the contrast bolus. Anatomic variants: None significant.  No aneurysm. Review of the MIP images confirms the above findings CT Brain Perfusion Findings: CBF (<30%) Volume: 84mL Perfusion (Tmax>6.0s) volume: 27mL Mismatch Volume: 75mL Infarction Location:Negative CT perfusion with no evidence for acute core infarct or other perfusion deficit. IMPRESSION: CTA HEAD AND NECK IMPRESSION: 1. Negative CTA of the head and neck with no evidence for large vessel occlusion. 2. Mild for age atheromatous plaque about the carotid bifurcations. No hemodynamically significant or correctable stenosis about the major arterial vasculature of the head and neck. CT PERFUSION IMPRESSION: Negative CT perfusion for acute ischemia or other perfusion abnormality. Electronically Signed   By: Jeannine Boga M.D.   On: 02/17/2020 21:18   DG Chest 2 View  Result Date: 02/17/2020 CLINICAL DATA:  80 year old female with acute ischemic stroke. EXAM:  CHEST - 2 VIEW COMPARISON:  None. FINDINGS: Minimal bibasilar atelectasis. No focal consolidation, pleural effusion or pneumothorax. The cardiac silhouette is within limits. No acute osseous pathology. IMPRESSION: No active cardiopulmonary disease. Electronically Signed   By: Anner Crete M.D.   On: 02/17/2020 21:32   CT HEAD WO CONTRAST  Result Date: 02/17/2020 CLINICAL DATA:  Expressive aphasia. EXAM: CT HEAD WITHOUT CONTRAST TECHNIQUE: Contiguous axial images were obtained from the base of the skull through the vertex without intravenous contrast. COMPARISON:  MRI brain dated March 18, 2017. FINDINGS: Brain: Punctate focus of hyperdensity in the right cerebellum (series 5, image 48; series 6, image 24). No evidence of acute infarction, hydrocephalus, extra-axial collection or mass lesion/mass effect. Stable atrophy and severe chronic microvascular ischemic changes. Vascular: Atherosclerotic vascular calcification of the carotid siphons. No hyperdense vessel.  Skull: Normal. Negative for fracture or focal lesion. Sinuses/Orbits: No acute finding. Other: None. IMPRESSION: 1. Punctate focus of hyperdensity in the right cerebellum, likely microhemorrhage related to patient's underlying severe amyloid angiopathy as demonstrated on prior MRI. 2. Stable atrophy and severe chronic microvascular ischemic changes. Electronically Signed   By: Titus Dubin M.D.   On: 02/17/2020 14:21   CT Angio Neck W and/or Wo Contrast  Result Date: 02/17/2020 CLINICAL DATA:  Initial evaluation for neuro deficit, stroke suspected, expressive aphasia. EXAM: CT ANGIOGRAPHY HEAD AND NECK CT PERFUSION BRAIN TECHNIQUE: Multidetector CT imaging of the head and neck was performed using the standard protocol during bolus administration of intravenous contrast. Multiplanar CT image reconstructions and MIPs were obtained to evaluate the vascular anatomy. Carotid stenosis measurements (when applicable) are obtained utilizing NASCET  criteria, using the distal internal carotid diameter as the denominator. Multiphase CT imaging of the brain was performed following IV bolus contrast injection. Subsequent parametric perfusion maps were calculated using RAPID software. CONTRAST:  167mL OMNIPAQUE IOHEXOL 350 MG/ML SOLN COMPARISON:  Prior head CT from earlier the same day. FINDINGS: CTA NECK FINDINGS Aortic arch: Visualized aortic arch of normal caliber with normal branch pattern. No hemodynamically significant stenosis seen about the origin of the great vessels. Right carotid system: Right CCA patent from its origin to the bifurcation without stenosis. Mild atheromatous change about the right bifurcation without significant stenosis. Right ICA widely patent distally without stenosis, dissection or occlusion. Left carotid system: Left CCA patent from its origin to the bifurcation without stenosis. Mild atheromatous plaque about the left bifurcation without significant stenosis. Left ICA widely patent distally without stenosis, dissection or occlusion. Vertebral arteries: Both vertebral arteries arise from subclavian arteries. Vertebral arteries widely patent without stenosis, dissection or occlusion. Skeleton: No visible acute osseous abnormality. No discrete or worrisome osseous lesions. Other neck: No other acute soft tissue abnormality within the neck. No mass or adenopathy. Upper chest: Visualized upper chest demonstrates no acute finding. Review of the MIP images confirms the above findings CTA HEAD FINDINGS Anterior circulation: Both internal carotid arteries widely patent to the termini without stenosis. A1 segments widely patent. Normal anterior communicating artery complex. Anterior cerebral arteries widely patent to their distal aspects. No M1 stenosis or occlusion. Normal MCA bifurcations. Distal MCA branches well perfused and symmetric. Posterior circulation: Both V4 segments widely patent to the vertebrobasilar junction. Both PICA origins  patent and normal. Basilar widely patent to its distal aspect without stenosis. Superior cerebellar arteries patent bilaterally. Both PCAs primarily supplied via the basilar well perfused to their distal aspects. Venous sinuses: Not well assessed due to timing of the contrast bolus. Anatomic variants: None significant.  No aneurysm. Review of the MIP images confirms the above findings CT Brain Perfusion Findings: CBF (<30%) Volume: 61mL Perfusion (Tmax>6.0s) volume: 80mL Mismatch Volume: 54mL Infarction Location:Negative CT perfusion with no evidence for acute core infarct or other perfusion deficit. IMPRESSION: CTA HEAD AND NECK IMPRESSION: 1. Negative CTA of the head and neck with no evidence for large vessel occlusion. 2. Mild for age atheromatous plaque about the carotid bifurcations. No hemodynamically significant or correctable stenosis about the major arterial vasculature of the head and neck. CT PERFUSION IMPRESSION: Negative CT perfusion for acute ischemia or other perfusion abnormality. Electronically Signed   By: Jeannine Boga M.D.   On: 02/17/2020 21:18   MR BRAIN WO CONTRAST  Result Date: 02/17/2020 CLINICAL DATA:  Initial evaluation for acute neuro deficit, stroke suspected. EXAM: MRI HEAD WITHOUT CONTRAST  TECHNIQUE: Multiplanar, multiecho pulse sequences of the brain and surrounding structures were obtained without intravenous contrast. COMPARISON:  Prior CT and CT perfusion from earlier the same day. Comparison also made with previous MRI from 03/18/2017. FINDINGS: Brain: Diffuse prominence of the CSF containing spaces compatible with generalized age-related cerebral atrophy. Patchy T2/FLAIR hyperintensity within the periventricular and deep white matter both cerebral hemispheres consistent with chronic microvascular ischemic disease, moderately advanced in nature. 1.7 cm acute ischemic nonhemorrhagic infarcts seen involving the left frontal corona radiata. No associated hemorrhage or mass  effect. No other evidence for acute or subacute ischemia. Gray-white matter differentiation otherwise maintained. No acute intracranial hemorrhage. Again seen are innumerable chronic micro hemorrhages scattered throughout both cerebral and cerebellar hemispheres, predominantly peripheral in location, and most consistent with severe cerebral amyloid angiopathy. Superimposed areas of encephalomalacia with chronic hemosiderin staining at the posterior right frontal, right parietal, and left occipital lobes likely reflect remote hemorrhages, likely related to underlying angiopathy. No mass lesion, midline shift or mass effect. Mild diffuse ventricular prominence related to global parenchymal volume loss without hydrocephalus. No extra-axial fluid collection. Pituitary gland and suprasellar region within normal limits. Midline structures intact. Small pericallosal lipoma noted. Vascular: Major intracranial vascular flow voids are maintained. Skull and upper cervical spine: Craniocervical junction normal. Bone marrow signal intensity within normal limits. No scalp soft tissue abnormality. Sinuses/Orbits: Patient status post bilateral ocular lens replacement. Globes and orbital soft tissues demonstrate no acute finding. Paranasal sinuses are clear. Trace bilateral mastoid effusions, of doubtful significance. Other: None. IMPRESSION: 1. 1.7 cm acute ischemic nonhemorrhagic infarct involving the left frontal corona radiata. 2. Innumerable chronic micro hemorrhages scattered throughout both cerebral and cerebellar hemispheres, most consistent with severe cerebral amyloid angiopathy. 3. Underlying age-related cerebral atrophy with moderately advanced chronic microvascular ischemic disease. Electronically Signed   By: Jeannine Boga M.D.   On: 02/17/2020 21:25   CT CEREBRAL PERFUSION W CONTRAST  Result Date: 02/17/2020 CLINICAL DATA:  Initial evaluation for neuro deficit, stroke suspected, expressive aphasia. EXAM: CT  ANGIOGRAPHY HEAD AND NECK CT PERFUSION BRAIN TECHNIQUE: Multidetector CT imaging of the head and neck was performed using the standard protocol during bolus administration of intravenous contrast. Multiplanar CT image reconstructions and MIPs were obtained to evaluate the vascular anatomy. Carotid stenosis measurements (when applicable) are obtained utilizing NASCET criteria, using the distal internal carotid diameter as the denominator. Multiphase CT imaging of the brain was performed following IV bolus contrast injection. Subsequent parametric perfusion maps were calculated using RAPID software. CONTRAST:  14mL OMNIPAQUE IOHEXOL 350 MG/ML SOLN COMPARISON:  Prior head CT from earlier the same day. FINDINGS: CTA NECK FINDINGS Aortic arch: Visualized aortic arch of normal caliber with normal branch pattern. No hemodynamically significant stenosis seen about the origin of the great vessels. Right carotid system: Right CCA patent from its origin to the bifurcation without stenosis. Mild atheromatous change about the right bifurcation without significant stenosis. Right ICA widely patent distally without stenosis, dissection or occlusion. Left carotid system: Left CCA patent from its origin to the bifurcation without stenosis. Mild atheromatous plaque about the left bifurcation without significant stenosis. Left ICA widely patent distally without stenosis, dissection or occlusion. Vertebral arteries: Both vertebral arteries arise from subclavian arteries. Vertebral arteries widely patent without stenosis, dissection or occlusion. Skeleton: No visible acute osseous abnormality. No discrete or worrisome osseous lesions. Other neck: No other acute soft tissue abnormality within the neck. No mass or adenopathy. Upper chest: Visualized upper chest demonstrates no acute finding. Review of the  MIP images confirms the above findings CTA HEAD FINDINGS Anterior circulation: Both internal carotid arteries widely patent to the  termini without stenosis. A1 segments widely patent. Normal anterior communicating artery complex. Anterior cerebral arteries widely patent to their distal aspects. No M1 stenosis or occlusion. Normal MCA bifurcations. Distal MCA branches well perfused and symmetric. Posterior circulation: Both V4 segments widely patent to the vertebrobasilar junction. Both PICA origins patent and normal. Basilar widely patent to its distal aspect without stenosis. Superior cerebellar arteries patent bilaterally. Both PCAs primarily supplied via the basilar well perfused to their distal aspects. Venous sinuses: Not well assessed due to timing of the contrast bolus. Anatomic variants: None significant.  No aneurysm. Review of the MIP images confirms the above findings CT Brain Perfusion Findings: CBF (<30%) Volume: 83mL Perfusion (Tmax>6.0s) volume: 79mL Mismatch Volume: 38mL Infarction Location:Negative CT perfusion with no evidence for acute core infarct or other perfusion deficit. IMPRESSION: CTA HEAD AND NECK IMPRESSION: 1. Negative CTA of the head and neck with no evidence for large vessel occlusion. 2. Mild for age atheromatous plaque about the carotid bifurcations. No hemodynamically significant or correctable stenosis about the major arterial vasculature of the head and neck. CT PERFUSION IMPRESSION: Negative CT perfusion for acute ischemia or other perfusion abnormality. Electronically Signed   By: Jeannine Boga M.D.   On: 02/17/2020 21:18    EKG: Independently reviewed.  Assessment/Plan Principal Problem:   Acute ischemic stroke [facility]) Active Problems:   Cerebral amyloid angiopathy (CODE)   DNR (do not resuscitate)   Major neurocognitive disorder, due to another medical condition, without behavioral disturbance, moderate ([place])    1. Acute ischemic stroke - 1. Stroke pathway 2. 2d echo 3. Tele monitor 4. See neuro consult 5. PT/OT/SLP 6. Swallow screen / study 7. No antiplatelets due to amyloid  angiopathy and risk of bleed. 8. BP goal < 160 9. Cont statin when able to take POs 2. Cerebral amyloid - 1. Chronic 2. No antiplatelets or anticoags due to this and risk of bleed 3. More strict BP goal than usually in acute ischemic stroke due to risk of bleed  DVT prophylaxis: SCDs Code Status: DNR - from patients chart, ACP Family Communication: Daughter at bedside, son on phone Disposition Plan: SNF after admit Consults called: Dr. Leonel Ramsay Admission status: Admit to inpatient  Severity of Illness: The appropriate patient status for this patient is INPATIENT. Inpatient status is judged to be reasonable and necessary in order to provide the required intensity of service to ensure the patient's safety. The patient's presenting symptoms, physical exam findings, and initial radiographic and laboratory data in the context of their chronic comorbidities is felt to place them at high risk for further clinical deterioration. Furthermore, it is not anticipated that the patient will be medically stable for discharge from the hospital within 2 midnights of admission. The following factors support the patient status of inpatient.   IP status due to acute ischemic stroke.  * I certify that at the point of admission it is my clinical judgment that the patient will require inpatient hospital care spanning beyond 2 midnights from the point of admission due to high intensity of service, high risk for further deterioration and high frequency of surveillance required.*    Destany Severns M. DO [facility]  How to contact the [facility] Attending or Consulting provider [place] or covering provider during after hours [place], for this patient?  1. Check the care team in [facility] and look for a)  attending/consulting [facility] provider listed and b) the [facility] team listed 2. Log into [email or web address]  Amion Physician Scheduling and messaging for groups and whole hospitals  On call and physician scheduling software for  group practices, residents, hospitalists and other medical providers for call, clinic, rotation and shift schedules. OnCall [employer] is a hospital-wide system for scheduling doctors and paging doctors on call. EasyPlot is for scientific plotting and data analysis.  [email or web address]  and use [place]'s universal password to access. If you do not have the password, please contact the hospital operator.  3. Locate the [facility] provider you are looking for under [facility] and page to a number that you can be directly reached. 4. If you still have difficulty reaching the provider, please page the [facility] (Director on Call) for the Hospitalists listed on amion for assistance.  02/17/2020, 10:29 PM

## 2020-02-17 NOTE — ED Notes (Signed)
Pt is sleepng at this time

## 2020-02-17 NOTE — Consult Note (Signed)
Neurology Consultation Reason for Consult: Aphasia Referring Physician: Tyrone Nine, D  CC: Aphasia  History is obtained from: Patient  HPI: Carmen [name] is a 80 y.o. female with a history of amyloid angiopathy, hyperlipidemia who presents with new onset aphasia.  She was in her normal state of health when she talked to her children last night on the phone, but then this morning when her daughter talked with her around 10 AM, she noticed that she was not talking right.  She states that is been a fairly static deficit since that time.   LKW: 1/8 7:30 PM tpa given?: no, outside of window    ROS: Unable to obtain due to altered mental status.   Past Medical History:  Diagnosis Date  . Anxiety    Per [facility] New Patient Packet   . Bleb x 4   . Depression    Per [facility] New Patient Packet   . Dermatitis due to solar radiation   . Dyshidrosis 05/03/2007  . Fuchs' corneal dystrophy    Per [facility] New Patient Packet   . Glaucoma   . Heart murmur   . Hyperlipidemia    Per [facility] New Patient Packet   . Malignant melanoma ([place])    Per [facility] New Patient Packet   . Osteopenia   . Pernicious anemia    Per [place] New Patient Packet   . Tinnitus      Family History  Problem Relation Age of Onset  . Stroke Other   . Heart disease Other   . Breast cancer Other   . Breast cancer Mother      Social History:  reports that she has never smoked. She has never used smokeless tobacco. She reports current alcohol use. She reports that she does not use drugs.   Exam: Current vital signs: BP (!) 152/87   Pulse (!) 103   Temp 99.2 F (37.3 C) (Oral)   Resp (!) 21   SpO2 97%  Vital signs in last 24 hours: Temp:  [98.3 F (36.8 C)-99.2 F (37.3 C)] 99.2 F (37.3 C) (01/09 1725) Pulse Rate:  [94-103] 103 (01/09 1930) Resp:  [14-26] 21 (01/09 1930) BP: (129-166)/(86-105) 152/87 (01/09 1930) SpO2:  [94 %-100 %] 97 % (01/09 1930)   Physical Exam  Constitutional: Appears well-developed and  well-nourished.  Psych: Affect appropriate to situation Eyes: No scleral injection HENT: No OP obstrucion MSK: no joint deformities.  Cardiovascular: Normal rate and regular rhythm.  Respiratory: Effort normal, non-labored breathing GI: Soft.  No distension. There is no tenderness.  Skin: WDI  Neuro: Mental Status: Patient is awake, alert, she is unable to give me the month or the year, or her age.   Patient is able to give a clear and coherent history. No signs of  neglect She has difficulty naming objects such as Band-Aid, or tape, but is able to tell me thumb and pinky Cranial Nerves: II: Visual Fields are full. Pupils are equal, round, and reactive to light.   III,IV, VI: EOMI without ptosis or diploplia.  V: Facial sensation is symmetric to temperature VII: Facial movement is symmetric.  VIII: hearing is intact to voice X: Uvula elevates symmetrically XI: Shoulder shrug is symmetric. XII: tongue is midline without atrophy or fasciculations.  Motor: Tone is normal. Bulk is normal. 5/5 strength was present in all four extremities.  Sensory: Sensation is symmetric to light touch and temperature in the arms and legs. Cerebellar: She has intentional tremor bilaterally  I have reviewed labs in epic and the results pertinent to this consultation are: CMP is normal  I have reviewed the images obtained: CT-concern for possible cerebellar microhemorrhage, CTA-negative, MRI-ischemic infarct  Impression: 80 year old female with amyloid angiopathy with a new acute ischemic infarct.  Her microhemorrhage is unlikely to be symptomatic or problematic.  Starting antiplatelet therapy is somewhat concerning given the number of her microhemorrhages, and at the very least I would favor repeating a CT scan to ensure stability before starting antiplatelet therapy.  I would favor avoiding severe hypertension and using the systolic goal of less than 160.  Recommendations: - HgbA1c, fasting  lipid panel - Frequent neuro checks - Echocardiogram -Repeat CT in the morning - Prophylactic therapy-none for now - Risk factor modification - Telemetry monitoring - PT consult, OT consult, Speech consult - Stroke team to follow    Roland Rack, MD [facility] [phone]  If 7pm- 7am, please page neurology on call as listed in [place].

## 2020-02-18 ENCOUNTER — Inpatient Hospital Stay (HOSPITAL_COMMUNITY): Payer: Medicare Other

## 2020-02-18 DIAGNOSIS — I680 Cerebral amyloid angiopathy: Secondary | ICD-10-CM | POA: Diagnosis not present

## 2020-02-18 DIAGNOSIS — R4701 Aphasia: Secondary | ICD-10-CM | POA: Diagnosis not present

## 2020-02-18 DIAGNOSIS — I6389 Other cerebral infarction: Secondary | ICD-10-CM

## 2020-02-18 DIAGNOSIS — I639 Cerebral infarction, unspecified: Secondary | ICD-10-CM

## 2020-02-18 LAB — LIPID PANEL
Cholesterol: 215 mg/dL — ABNORMAL HIGH (ref 0–200)
HDL: 71 mg/dL (ref >40)
LDL Cholesterol: 134 mg/dL — ABNORMAL HIGH (ref 0–99)
Total CHOL/HDL Ratio: 3 RATIO
Triglycerides: 50 mg/dL (ref <150)
VLDL: 10 mg/dL (ref 0–40)

## 2020-02-18 LAB — ECHOCARDIOGRAM COMPLETE
Area-P 1/2: 4.31 cm2
S' Lateral: 1.7 cm
Single Plane A4C EF: 68.4 %

## 2020-02-18 LAB — HEMOGLOBIN A1C
Hgb A1c MFr Bld: 5.7 % — ABNORMAL HIGH (ref 4.8–5.6)
Mean Plasma Glucose: 116.89 mg/dL

## 2020-02-18 MED ORDER — LATANOPROST 0.005 % OP SOLN
1.0000 [drp] | Freq: Every day | OPHTHALMIC | Status: DC
  Administered 2020-02-18: 1 [drp] via OPHTHALMIC
  Filled 2020-02-18: qty 2.5

## 2020-02-18 MED ORDER — LACTATED RINGERS IV SOLN: INTRAVENOUS | Status: DC

## 2020-02-18 MED ORDER — ASPIRIN EC 81 MG PO TBEC
81.0000 mg | DELAYED_RELEASE_TABLET | Freq: Every day | ORAL | Status: DC
  Administered 2020-02-18 – 2020-02-19 (×2): 81 mg via ORAL
  Filled 2020-02-18 (×2): qty 1

## 2020-02-18 MED ORDER — STROKE: EARLY STAGES OF RECOVERY BOOK
Status: AC
  Filled 2020-02-18: qty 1

## 2020-02-18 MED ORDER — LORAZEPAM 2 MG/ML IJ SOLN
0.5000 mg | Freq: Once | INTRAMUSCULAR | Status: AC
  Administered 2020-02-18: 0.5 mg via INTRAVENOUS
  Filled 2020-02-18: qty 1

## 2020-02-18 NOTE — Progress Notes (Signed)
Pt has been admitted to the unit. All belongings are present and daughter is at the bedside. All IV and equipment are present with the pt. Pt denies any pain.  02/18/20 1520  Vitals  Temp 98.4 F (36.9 C)  Temp Source Oral  BP 128/76  MAP (mmHg) 90  BP Location Left Arm  BP Method Automatic  Patient Position (if appropriate) Lying  Pulse Rate 81  Resp 18  Level of Consciousness  Level of Consciousness Alert  MEWS COLOR  MEWS Score Color Green  Oxygen Therapy  SpO2 97 %  O2 Device Room Air  Pain Assessment  Pain Scale 0-10  Pain Score 0  MEWS Score  MEWS Temp 0  MEWS Systolic 0  MEWS Pulse 0  MEWS RR 0  MEWS LOC 0  MEWS Score 0

## 2020-02-18 NOTE — Evaluation (Signed)
Speech Language Pathology Evaluation Patient Details Name: Carmen [name] MRN: [id number] DOB: November 20, 1940 Today's Date: 02/18/2020 Time: 1330-1430 SLP Time Calculation (min) (ACUTE ONLY): 60 min  Problem List:  Patient Active Problem List   Diagnosis Date Noted  . Acute ischemic stroke ([place]) 02/17/2020  . Major neurocognitive disorder, due to another medical condition, without behavioral disturbance, moderate ([place]) 03/03/2019  . Panic attacks 12/09/2018  . Cerebral amyloid angiopathy (CODE) 12/09/2018  . OSA on CPAP 12/09/2018  . DNR (do not resuscitate) 12/09/2018  . Situational anxiety 03/07/2018  . Pernicious anemia 04/19/2017  . Situational depression 04/27/2016  . Contracture of palmar fascia 07/23/2015  . History of malignant melanoma of skin 02/12/2014  . Cataract 10/23/2013  . Family history of breast cancer 10/23/2013  . Fuchs' corneal dystrophy 10/23/2013  . Glaucoma 10/23/2013  . Hyperlipidemia 03/21/2013  . [place] 12/12/2012   Past Medical History:  Past Medical History:  Diagnosis Date  . Anxiety    Per [facility] New Patient Packet   . Bleb x 4   . Depression    Per [facility] New Patient Packet   . Dermatitis due to solar radiation   . Dyshidrosis 05/03/2007  . Fuchs' corneal dystrophy    Per [facility] New Patient Packet   . Glaucoma   . Heart murmur   . Hyperlipidemia    Per [facility] New Patient Packet   . Malignant melanoma ([place])    Per [facility] New Patient Packet   . Osteopenia   . Pernicious anemia    Per [place] New Patient Packet   . Tinnitus    Past Surgical History:  Past Surgical History:  Procedure Laterality Date  . CORNEAL TRANSPLANT     Dr.[facility], Per [place] New Patient Packet   . MASTECTOMY    . PLACEMENT OF BREAST IMPLANTS     HPI:  80yo female admitted 02/17/20 with new onset aphasia. PMH: anxiety, amyloid angiopathy, HLD, baseline dementia per dtr. MRI = 1. 1.7 cm acute ischemic nonhemorrhagic infarct involving the left frontal corona radiata. 2.  Innumerable chronic micro hemorrhages scattered throughout both cerebral and cerebellar hemispheres, most consistent with severe cerebral amyloid angiopathy. 3. Underlying age-related cerebral atrophy with moderately advanced chronic microvascular ischemic disease.   Assessment / Plan / Recommendation Clinical Impression  Pt seen at bedside for evaluation of receptive and expressive language. Pt's speech is fully intelligible, and no obvious facial asymmetry is noted. Pt presents with mild receptive and expressive aphasia, characterized as follows: Pt is able to answer simple and personal yes/no questions accurately. Responses to complex and abstract yes/no questions are not accurate or reliable. Pt is able to follow 1-step commands, but not 2+step commands. Right/left discrimination is also impaired, likely due to the increased complexity of the command. Expressive language: Pt is able to complete automatic sequences with min cues, but tends to perseverate. She is able to repeat multisyllabi words and sentence length material. High probability sentence completion and responsive naming tasks were 80% accurate. Naming common objects was 20% accurate, and she was unable to complete a simple divergent naming task. Continued skilled ST intervention is recommended to maximize pt's functional communication and minimize caregiver burden.  ST will continue to follow pt acutely. Continued treatment after discharge will be beneficial as well.   Pt passed [facility] swallow screen with RN. Pt was observed eating a sandwich and applesauce, and drinking water. No difficulty observed.     SLP Assessment  SLP Recommendation/Assessment: Patient needs continued Speech Language Pathology Services  SLP Visit Diagnosis: Aphasia (R47.01)    Follow Up Recommendations  Other (comment) (anticipate need for continued ST intervention at DC)    Frequency and Duration min 1 x/week  2 weeks      SLP Evaluation Cognition  Overall  Cognitive Status: History of cognitive impairments - at baseline Arousal/Alertness: Awake/alert       Comprehension  Auditory Comprehension Overall Auditory Comprehension: Impaired Yes/No Questions: Impaired Basic Biographical Questions: 76-100% accurate Basic Immediate Environment Questions: 75-100% accurate Complex Questions: 0-24% accurate Commands: Impaired One Step Basic Commands: 75-100% accurate Two Step Basic Commands: 50-74% accurate Multistep Basic Commands: 25-49% accurate Conversation: Simple Interfering Components: Working Curator: Not tested Reading Comprehension Reading Status: Not tested    Expression Expression Primary Mode of Expression: Verbal Verbal Expression Overall Verbal Expression: Impaired Initiation: No impairment Automatic Speech: Name (mild difficulty with automatic sequences, some perseveration) Level of Generative/Spontaneous Verbalization: Phrase Repetition: No impairment Naming: Impairment Responsive: 0-25% accurate Confrontation: Impaired Convergent: 25-49% accurate Divergent: 25-49% accurate Verbal Errors: Aware of errors;Perseveration;Confabulation Pragmatics: No impairment Written Expression Dominant Hand: Right Written Expression: Not tested   Oral / Motor  Oral Motor/Sensory Function Overall Oral Motor/Sensory Function: Within functional limits Motor Speech Overall Motor Speech: Appears within functional limits for tasks assessed Respiration: Within functional limits Phonation: Normal Resonance: Within functional limits Articulation: Within functional limitis Intelligibility: Intelligible Motor Planning: Witnin functional limits Motor Speech Errors: Not applicable   GO                   [name] [name], [facility], [place] Speech Language Pathologist Office: [phone] Pager: [phone]  Shonna Chock 02/18/2020, 2:55 PM

## 2020-02-18 NOTE — Discharge Planning (Signed)
RNCM consulted regarding pt returning to previous level of care assisted living(AL) at [facility] vs skilled nursing (SN).  RNCM left message at facility and is awaiting guidance if PT evaluation is required to increase level of care.

## 2020-02-18 NOTE — Evaluation (Addendum)
Physical Therapy Evaluation Patient Details Name: Carmen [name] MRN: [id number] DOB: 02-10-1940 Today's Date: 02/18/2020   History of Present Illness  Pt adm 02/17/20 with new onset aphasia. 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 PMH - anxiety, amyloid angiopathy, dementia.  Clinical Impression  Pt presents to PT likely at or very close to her baseline with mobility. Pt very anxious and confused being in an unfamiliar setting. I think a return to the familiar environment of her ALF will be beneficial.     Follow Up Recommendations No PT follow up;Supervision/Assistance - 24 hour (at ALF)    Equipment Recommendations  Other (comment)    Recommendations for Other Services       Precautions / Restrictions Precautions Precautions: Fall      Mobility  Bed Mobility Overal bed mobility: Modified Independent                  Transfers Overall transfer level: Needs assistance Equipment used: None Transfers: Sit to/from Stand Sit to Stand: Supervision         General transfer comment: supervision for safety  Ambulation/Gait Ambulation/Gait assistance: Supervision;Min guard Gait Distance (Feet): 200 Feet Assistive device: None;1 person hand held assist;4-wheeled walker Gait Pattern/deviations: Step-through pattern;Decreased stride length Gait velocity: decr Gait velocity interpretation: 1.31 - 2.62 ft/sec, indicative of limited community ambulator General Gait Details: assist for safety. Use of assistive device didn't change gait significantly and pt forgetting to take rollator with her when turning. Pt very distracted and confused. Tangential thoughts. Talking about wanting to find her mother.  Stairs            Wheelchair Mobility    Modified Rankin (Stroke Patients Only) Modified Rankin (Stroke Patients Only) Pre-Morbid Rankin Score: Moderate disability Modified Rankin: Moderately severe disability     Balance Overall balance  assessment: Needs assistance Sitting-balance support: No upper extremity supported;Feet supported Sitting balance-Leahy Scale: Good     Standing balance support: No upper extremity supported;During functional activity Standing balance-Leahy Scale: Good                               Pertinent Vitals/Pain Pain Assessment: No/denies pain    Home Living Family/patient expects to be discharged to:: Assisted living   Available Help at Discharge: Other (Comment) (staff) Type of Home: Assisted living         Home Equipment: None Additional Comments: Pt unreliable historian    Prior Function Level of Independence: Needs assistance   Gait / Transfers Assistance Needed: Per pt modified independent without assistive device           Hand Dominance   Dominant Hand: Right    Extremity/Trunk Assessment   Upper Extremity Assessment Upper Extremity Assessment: Defer to OT evaluation    Lower Extremity Assessment Lower Extremity Assessment: Overall WFL for tasks assessed       Communication   Communication: Receptive difficulties;Expressive difficulties (see SLP note)  Cognition Arousal/Alertness: Awake/alert Behavior During Therapy: Anxious Overall Cognitive Status: No family/caregiver present to determine baseline cognitive functioning                                 General Comments: Pt with dementia at baseline. Very anxious.      General Comments      Exercises     Assessment/Plan    PT Assessment Patent does not  need any further PT services  PT Problem List         PT Treatment Interventions      PT Goals (Current goals can be found in the Care Plan section)  Acute Rehab PT Goals PT Goal Formulation: All assessment and education complete, DC therapy    Frequency     Barriers to discharge        Co-evaluation               AM-PAC PT "6 Clicks" Mobility  Outcome Measure Help needed turning from your back to your  side while in a flat bed without using bedrails?: None Help needed moving from lying on your back to sitting on the side of a flat bed without using bedrails?: None Help needed moving to and from a bed to a chair (including a wheelchair)?: None Help needed standing up from a chair using your arms (e.g., wheelchair or bedside chair)?: None Help needed to walk in hospital room?: A Little Help needed climbing 3-5 steps with a railing? : A Little 6 Click Score: 22    End of Session Equipment Utilized During Treatment: Gait belt Activity Tolerance: Patient tolerated treatment well Patient left: in bed;with call bell/phone within reach;with bed alarm set Nurse Communication: Mobility status PT Visit Diagnosis: Other abnormalities of gait and mobility (R26.89)    Time: 1645-1700 PT Time Calculation (min) (ACUTE ONLY): 15 min   Charges:   PT Evaluation $PT Eval Moderate Complexity: 1 Mod          [place] Pager [phone] Office [place] 02/18/2020, 5:18 PM

## 2020-02-18 NOTE — Progress Notes (Addendum)
STROKE TEAM PROGRESS NOTE   INTERVAL HISTORY No acute events since admission last pm.  Patient is tearful and anxious regarding aphasia.  Daughter in law at bedside. Plan of care discussed. Questions answered.   Vitals:   02/18/20 0648 02/18/20 0700 02/18/20 0715 02/18/20 0745  BP:  137/87 139/89 119/86  Pulse: 77 83 88 80  Resp: 18 18 16 15   Temp: 98.4 F (36.9 C)     TempSrc: Oral     SpO2: 98% 98% 96% 93%   CBC:  Recent Labs  Lab 02/17/20 1322 02/17/20 1344  WBC 6.0  --   NEUTROABS 4.5  --   HGB 14.7 16.7*  HCT 47.7* 49.0*  MCV 87.7  --   PLT 255  --    Basic Metabolic Panel:  Recent Labs  Lab 02/17/20 1322 02/17/20 1344  NA 141 143  K 3.9 3.7  CL 106 107  CO2 23  --   GLUCOSE 140* 132*  BUN 16 19  CREATININE 0.78 0.70  CALCIUM 9.4  --    Lipid Panel:  Recent Labs  Lab 02/18/20 0541  CHOL 215*  TRIG 50  HDL 71  CHOLHDL 3.0  VLDL 10  LDLCALC 134*   HgbA1c:  Recent Labs  Lab 02/18/20 0541  HGBA1C 5.7*   Urine Drug Screen: No results for input(s): LABOPIA, COCAINSCRNUR, LABBENZ, AMPHETMU, THCU, LABBARB in the last 168 hours.  Alcohol Level No results for input(s): ETH in the last 168 hours.  IMAGING past 24 hours CT Angio Head W or Wo Contrast  Result Date: 02/17/2020 CLINICAL DATA:  Initial evaluation for neuro deficit, stroke suspected, expressive aphasia. EXAM: CT ANGIOGRAPHY HEAD AND NECK CT PERFUSION BRAIN TECHNIQUE: Multidetector CT imaging of the head and neck was performed using the standard protocol during bolus administration of intravenous contrast. Multiplanar CT image reconstructions and MIPs were obtained to evaluate the vascular anatomy. Carotid stenosis measurements (when applicable) are obtained utilizing NASCET criteria, using the distal internal carotid diameter as the denominator. Multiphase CT imaging of the brain was performed following IV bolus contrast injection. Subsequent parametric perfusion maps were calculated using RAPID  software. CONTRAST:  115mL OMNIPAQUE IOHEXOL 350 MG/ML SOLN COMPARISON:  Prior head CT from earlier the same day. FINDINGS: CTA NECK FINDINGS Aortic arch: Visualized aortic arch of normal caliber with normal branch pattern. No hemodynamically significant stenosis seen about the origin of the great vessels. Right carotid system: Right CCA patent from its origin to the bifurcation without stenosis. Mild atheromatous change about the right bifurcation without significant stenosis. Right ICA widely patent distally without stenosis, dissection or occlusion. Left carotid system: Left CCA patent from its origin to the bifurcation without stenosis. Mild atheromatous plaque about the left bifurcation without significant stenosis. Left ICA widely patent distally without stenosis, dissection or occlusion. Vertebral arteries: Both vertebral arteries arise from subclavian arteries. Vertebral arteries widely patent without stenosis, dissection or occlusion. Skeleton: No visible acute osseous abnormality. No discrete or worrisome osseous lesions. Other neck: No other acute soft tissue abnormality within the neck. No mass or adenopathy. Upper chest: Visualized upper chest demonstrates no acute finding. Review of the MIP images confirms the above findings CTA HEAD FINDINGS Anterior circulation: Both internal carotid arteries widely patent to the termini without stenosis. A1 segments widely patent. Normal anterior communicating artery complex. Anterior cerebral arteries widely patent to their distal aspects. No M1 stenosis or occlusion. Normal MCA bifurcations. Distal MCA branches well perfused and symmetric. Posterior circulation: Both V4 segments  widely patent to the vertebrobasilar junction. Both PICA origins patent and normal. Basilar widely patent to its distal aspect without stenosis. Superior cerebellar arteries patent bilaterally. Both PCAs primarily supplied via the basilar well perfused to their distal aspects. Venous  sinuses: Not well assessed due to timing of the contrast bolus. Anatomic variants: None significant.  No aneurysm. Review of the MIP images confirms the above findings CT Brain Perfusion Findings: CBF (<30%) Volume: 11mL Perfusion (Tmax>6.0s) volume: 62mL Mismatch Volume: 8mL Infarction Location:Negative CT perfusion with no evidence for acute core infarct or other perfusion deficit. IMPRESSION: CTA HEAD AND NECK IMPRESSION: 1. Negative CTA of the head and neck with no evidence for large vessel occlusion. 2. Mild for age atheromatous plaque about the carotid bifurcations. No hemodynamically significant or correctable stenosis about the major arterial vasculature of the head and neck. CT PERFUSION IMPRESSION: Negative CT perfusion for acute ischemia or other perfusion abnormality. Electronically Signed   By: Jeannine Boga M.D.   On: 02/17/2020 21:18   DG Chest 2 View  Result Date: 02/17/2020 CLINICAL DATA:  80 year old female with acute ischemic stroke. EXAM: CHEST - 2 VIEW COMPARISON:  None. FINDINGS: Minimal bibasilar atelectasis. No focal consolidation, pleural effusion or pneumothorax. The cardiac silhouette is within limits. No acute osseous pathology. IMPRESSION: No active cardiopulmonary disease. Electronically Signed   By: Anner Crete M.D.   On: 02/17/2020 21:32   CT HEAD WO CONTRAST  Result Date: 02/17/2020 CLINICAL DATA:  Expressive aphasia. EXAM: CT HEAD WITHOUT CONTRAST TECHNIQUE: Contiguous axial images were obtained from the base of the skull through the vertex without intravenous contrast. COMPARISON:  MRI brain dated March 18, 2017. FINDINGS: Brain: Punctate focus of hyperdensity in the right cerebellum (series 5, image 48; series 6, image 24). No evidence of acute infarction, hydrocephalus, extra-axial collection or mass lesion/mass effect. Stable atrophy and severe chronic microvascular ischemic changes. Vascular: Atherosclerotic vascular calcification of the carotid siphons. No  hyperdense vessel. Skull: Normal. Negative for fracture or focal lesion. Sinuses/Orbits: No acute finding. Other: None. IMPRESSION: 1. Punctate focus of hyperdensity in the right cerebellum, likely microhemorrhage related to patient's underlying severe amyloid angiopathy as demonstrated on prior MRI. 2. Stable atrophy and severe chronic microvascular ischemic changes. Electronically Signed   By: Titus Dubin M.D.   On: 02/17/2020 14:21   CT Angio Neck W and/or Wo Contrast  Result Date: 02/17/2020 CLINICAL DATA:  Initial evaluation for neuro deficit, stroke suspected, expressive aphasia. EXAM: CT ANGIOGRAPHY HEAD AND NECK CT PERFUSION BRAIN TECHNIQUE: Multidetector CT imaging of the head and neck was performed using the standard protocol during bolus administration of intravenous contrast. Multiplanar CT image reconstructions and MIPs were obtained to evaluate the vascular anatomy. Carotid stenosis measurements (when applicable) are obtained utilizing NASCET criteria, using the distal internal carotid diameter as the denominator. Multiphase CT imaging of the brain was performed following IV bolus contrast injection. Subsequent parametric perfusion maps were calculated using RAPID software. CONTRAST:  125mL OMNIPAQUE IOHEXOL 350 MG/ML SOLN COMPARISON:  Prior head CT from earlier the same day. FINDINGS: CTA NECK FINDINGS Aortic arch: Visualized aortic arch of normal caliber with normal branch pattern. No hemodynamically significant stenosis seen about the origin of the great vessels. Right carotid system: Right CCA patent from its origin to the bifurcation without stenosis. Mild atheromatous change about the right bifurcation without significant stenosis. Right ICA widely patent distally without stenosis, dissection or occlusion. Left carotid system: Left CCA patent from its origin to the bifurcation without stenosis. Mild  atheromatous plaque about the left bifurcation without significant stenosis. Left ICA  widely patent distally without stenosis, dissection or occlusion. Vertebral arteries: Both vertebral arteries arise from subclavian arteries. Vertebral arteries widely patent without stenosis, dissection or occlusion. Skeleton: No visible acute osseous abnormality. No discrete or worrisome osseous lesions. Other neck: No other acute soft tissue abnormality within the neck. No mass or adenopathy. Upper chest: Visualized upper chest demonstrates no acute finding. Review of the MIP images confirms the above findings CTA HEAD FINDINGS Anterior circulation: Both internal carotid arteries widely patent to the termini without stenosis. A1 segments widely patent. Normal anterior communicating artery complex. Anterior cerebral arteries widely patent to their distal aspects. No M1 stenosis or occlusion. Normal MCA bifurcations. Distal MCA branches well perfused and symmetric. Posterior circulation: Both V4 segments widely patent to the vertebrobasilar junction. Both PICA origins patent and normal. Basilar widely patent to its distal aspect without stenosis. Superior cerebellar arteries patent bilaterally. Both PCAs primarily supplied via the basilar well perfused to their distal aspects. Venous sinuses: Not well assessed due to timing of the contrast bolus. Anatomic variants: None significant.  No aneurysm. Review of the MIP images confirms the above findings CT Brain Perfusion Findings: CBF (<30%) Volume: 37mL Perfusion (Tmax>6.0s) volume: 35mL Mismatch Volume: 25mL Infarction Location:Negative CT perfusion with no evidence for acute core infarct or other perfusion deficit. IMPRESSION: CTA HEAD AND NECK IMPRESSION: 1. Negative CTA of the head and neck with no evidence for large vessel occlusion. 2. Mild for age atheromatous plaque about the carotid bifurcations. No hemodynamically significant or correctable stenosis about the major arterial vasculature of the head and neck. CT PERFUSION IMPRESSION: Negative CT perfusion for  acute ischemia or other perfusion abnormality. Electronically Signed   By: Jeannine Boga M.D.   On: 02/17/2020 21:18   MR BRAIN WO CONTRAST  Result Date: 02/17/2020 CLINICAL DATA:  Initial evaluation for acute neuro deficit, stroke suspected. EXAM: MRI HEAD WITHOUT CONTRAST TECHNIQUE: Multiplanar, multiecho pulse sequences of the brain and surrounding structures were obtained without intravenous contrast. COMPARISON:  Prior CT and CT perfusion from earlier the same day. Comparison also made with previous MRI from 03/18/2017. FINDINGS: Brain: Diffuse prominence of the CSF containing spaces compatible with generalized age-related cerebral atrophy. Patchy T2/FLAIR hyperintensity within the periventricular and deep white matter both cerebral hemispheres consistent with chronic microvascular ischemic disease, moderately advanced in nature. 1.7 cm acute ischemic nonhemorrhagic infarcts seen involving the left frontal corona radiata. No associated hemorrhage or mass effect. No other evidence for acute or subacute ischemia. Gray-white matter differentiation otherwise maintained. No acute intracranial hemorrhage. Again seen are innumerable chronic micro hemorrhages scattered throughout both cerebral and cerebellar hemispheres, predominantly peripheral in location, and most consistent with severe cerebral amyloid angiopathy. Superimposed areas of encephalomalacia with chronic hemosiderin staining at the posterior right frontal, right parietal, and left occipital lobes likely reflect remote hemorrhages, likely related to underlying angiopathy. No mass lesion, midline shift or mass effect. Mild diffuse ventricular prominence related to global parenchymal volume loss without hydrocephalus. No extra-axial fluid collection. Pituitary gland and suprasellar region within normal limits. Midline structures intact. Small pericallosal lipoma noted. Vascular: Major intracranial vascular flow voids are maintained. Skull and  upper cervical spine: Craniocervical junction normal. Bone marrow signal intensity within normal limits. No scalp soft tissue abnormality. Sinuses/Orbits: Patient status post bilateral ocular lens replacement. Globes and orbital soft tissues demonstrate no acute finding. Paranasal sinuses are clear. Trace bilateral mastoid effusions, of doubtful significance. Other: None. IMPRESSION: 1. 1.7  cm acute ischemic nonhemorrhagic infarct involving the left frontal corona radiata. 2. Innumerable chronic micro hemorrhages scattered throughout both cerebral and cerebellar hemispheres, most consistent with severe cerebral amyloid angiopathy. 3. Underlying age-related cerebral atrophy with moderately advanced chronic microvascular ischemic disease. Electronically Signed   By: Jeannine Boga M.D.   On: 02/17/2020 21:25   CT CEREBRAL PERFUSION W CONTRAST  Result Date: 02/17/2020 CLINICAL DATA:  Initial evaluation for neuro deficit, stroke suspected, expressive aphasia. EXAM: CT ANGIOGRAPHY HEAD AND NECK CT PERFUSION BRAIN TECHNIQUE: Multidetector CT imaging of the head and neck was performed using the standard protocol during bolus administration of intravenous contrast. Multiplanar CT image reconstructions and MIPs were obtained to evaluate the vascular anatomy. Carotid stenosis measurements (when applicable) are obtained utilizing NASCET criteria, using the distal internal carotid diameter as the denominator. Multiphase CT imaging of the brain was performed following IV bolus contrast injection. Subsequent parametric perfusion maps were calculated using RAPID software. CONTRAST:  13mL OMNIPAQUE IOHEXOL 350 MG/ML SOLN COMPARISON:  Prior head CT from earlier the same day. FINDINGS: CTA NECK FINDINGS Aortic arch: Visualized aortic arch of normal caliber with normal branch pattern. No hemodynamically significant stenosis seen about the origin of the great vessels. Right carotid system: Right CCA patent from its origin to  the bifurcation without stenosis. Mild atheromatous change about the right bifurcation without significant stenosis. Right ICA widely patent distally without stenosis, dissection or occlusion. Left carotid system: Left CCA patent from its origin to the bifurcation without stenosis. Mild atheromatous plaque about the left bifurcation without significant stenosis. Left ICA widely patent distally without stenosis, dissection or occlusion. Vertebral arteries: Both vertebral arteries arise from subclavian arteries. Vertebral arteries widely patent without stenosis, dissection or occlusion. Skeleton: No visible acute osseous abnormality. No discrete or worrisome osseous lesions. Other neck: No other acute soft tissue abnormality within the neck. No mass or adenopathy. Upper chest: Visualized upper chest demonstrates no acute finding. Review of the MIP images confirms the above findings CTA HEAD FINDINGS Anterior circulation: Both internal carotid arteries widely patent to the termini without stenosis. A1 segments widely patent. Normal anterior communicating artery complex. Anterior cerebral arteries widely patent to their distal aspects. No M1 stenosis or occlusion. Normal MCA bifurcations. Distal MCA branches well perfused and symmetric. Posterior circulation: Both V4 segments widely patent to the vertebrobasilar junction. Both PICA origins patent and normal. Basilar widely patent to its distal aspect without stenosis. Superior cerebellar arteries patent bilaterally. Both PCAs primarily supplied via the basilar well perfused to their distal aspects. Venous sinuses: Not well assessed due to timing of the contrast bolus. Anatomic variants: None significant.  No aneurysm. Review of the MIP images confirms the above findings CT Brain Perfusion Findings: CBF (<30%) Volume: 71mL Perfusion (Tmax>6.0s) volume: 62mL Mismatch Volume: 53mL Infarction Location:Negative CT perfusion with no evidence for acute core infarct or other  perfusion deficit. IMPRESSION: CTA HEAD AND NECK IMPRESSION: 1. Negative CTA of the head and neck with no evidence for large vessel occlusion. 2. Mild for age atheromatous plaque about the carotid bifurcations. No hemodynamically significant or correctable stenosis about the major arterial vasculature of the head and neck. CT PERFUSION IMPRESSION: Negative CT perfusion for acute ischemia or other perfusion abnormality. Electronically Signed   By: Jeannine Boga M.D.   On: 02/17/2020 21:18   PHYSICAL EXAM Gen: Patient examined post ativan administration 80 yo female lying in bed asleep. Arouses with verbal stimuli to [name] drowsy state. Drifts back to sleep when undisturbed. Picks at  IV and nasal cannula when awake.  Normocephalic atraumatic. Tearful during speech assessment.  Respirations even and unlabored.  MS: Able to state her first name. Oriented to hospital (with multiple choices provided). Unable to state date/year. Names president. Tracks examiner while awake.   Neuro:  Face symmetric  VFF Facial sensation intact V1-V3 TML  Speech dysarthric Names 2/2 objects, Names daughter in law.   Unable to recall recent events   4+/5 bilat UE throughout symmetric 4+/5 bilat LE throughout symmetric  ASSESSMENT/PLAN Carmen [name] is [name] 80 y.o. female with [name] history of anxiety (on Ativan daily), amyloid angiopathy, hyperlipidemia who presents with new onset aphasia and was found on MR brain  to have [name] new 7 cm acute ischemic nonhemorrhagic infarct involving the left frontal corona radiata with innumerable chronic microhemorrhages scattered throughout both cerebellar hemispheres.    Stroke - left frontal CR infarct likely due to small vessel disease, could be related to CAA  CT head punctate focus of hyperdensity in the right cerebellum, likely microhemorrhage related to patient's underlying severe amyloid angiopathy as demonstrated on prior MRI.  CTA head & neck: Negative CTA of the head  and neck with no evidence for large vessel occlusion  CT perfusion: Negative CT perfusion for acute ischemia or other perfusionabnormality  MRI left frontal CR infarct with severe CAA  2D Echo EF 60-65%   LDL 134  HgbA1c 5.7  On ASA 81 PTA, will plan to continue ASA 81 mg as it is her home meds, now has ischemic stroke and pt has no hx of large ICH.   Therapy recommendations:  No PT  Disposition:  Likely home  Severe CAA  Known severe CAA from prior MRI  Re-confirmed this time with MRI  High risk of ICH  BP goal < 140, ideally < 130  OK to continue ASA 81 as above, but no antithrombotics more than this  Hypertension   BP high on presentation  Now stable [id number] systolic, [id number] diastolic  Long term BP goal < 140 given severe CAA  Hyperlipidemia  Home meds:  Pravastatin 10mg  resumed in hospital  LDL 134, goal < 70  Continue statin at discharge  Other Stroke Risk Factors  Advanced Age >/= 80    Family hx stroke   Other Active Problems  OSA following with Carmen [name] at [facility] day # 1  Carmen [name], Carmen [name], Carmen [name]   ATTENDING NOTE: I reviewed above note and agree with the assessment and plan. Pt was seen and examined.   80 year old female with history of CAD, HLD, OSA admitted for aphasia.  CT head showed no acute abnormality.  CT head and neck and CT perfusion negative.  MRI showed severe CAA and [name] left frontal CR small infarct.  EF 60 to 65%.  LDL 134, A1c 5.7.  Pt lying in bed, daughter at bedside.  She was drowsy earlier today after ativan but now more awake alert and cooperative. However, still has expressive aphasia and left gaze preference.  Not orientated to age or months.  Able to follow some simple commands.  Able to repeat simple sentences but not up to name.  Seem to has right neglect, right gaze incomplete.  Blinking to visual threat on the left, inconsistent to the right.  Facial symmetrical, bilateral upper and lower extremity equal  strengths, sensation subjective symmetrical.  Finger-to-nose grossly intact.  Gait not tested  Patient is stroke likely due to his mitral disease, could be related to CAA vasculature.  Since patient on aspirin 81 at home, resolved history of overt ICH, with current lacunar stroke, okay to continue aspirin 81.  However no other antithrombotics more than that.  Okay to continue home pravastatin 10.  Strict BP control less than 140 given severe CAA.  Patient follows with Carmen [name] at [facility].  Neurology will sign off. Please call with questions. Pt will follow up with Carmen [name] at [facility] in about 4 weeks. Thanks for the consult.  Carmen Hawking, MD PhD Stroke Neurology 02/18/2020 5:36 PM  To contact Stroke Continuity provider, please refer to [email or web address] After hours, contact General Neurology

## 2020-02-18 NOTE — Progress Notes (Signed)
[facility] PROGRESS NOTE    Carmen [name]  IZT:[id number] DOB: January 01, 1941 DOA: 02/17/2020 PCP: [name], DO      Brief Narrative:  Carmen [name] with amyloid angiopathy, anxiety, melanoma, pernicious anemia who presented with aphasia.   Patient called family this morning and couldn't speak right.  Last normal was night before at 7:30pm.  On MRI in the ER, she had a new 1cm L corona radiata infarct.        Assessment & Plan:  Stroke Due to CAA. -Non-invasive angiography showed no significant stenoses on CT angiography of the head and neck, including carotids -Echocardiogram showed no cardiogenic source of embolism  -Lipids ordered: continue pravastatin -Continue aspirin  -Atrial fibrillation: not present on tele -tPA not given because outside window -Dysphagia screen ordered in ER -PT eval ordered: recommended HH -Smoking cessation: not pertinent     Anxiety -Continue ativan as needed -Continue mirtazapine  Glaucoma -Continue eye drops         Disposition: Status is: Inpatient  Remains inpatient appropriate because:Ongoing diagnostic testing needed not appropriate for outpatient work up   Dispo: The patient is from: ALF              Anticipated d/c is to: ALF              Anticipated d/c date is: 1 day              Patient currently is medically stable to d/c.              MDM: The below labs and imaging reports were reviewed and summarized above.  Medication management as above.   DVT prophylaxis: SCD's Start: 02/17/20 2110  Code Status: FULL Family Communication: Daughter at bedsdie    Consultants:   Neurology        Subjective: Still aphasic.  No fever, focal weakness, no vomiting, no headache.  Objective: Vitals:   02/18/20 0648 02/18/20 0700 02/18/20 0715 02/18/20 0745  BP:  137/87 139/89 119/86  Pulse: 77 83 88 80  Resp: 18 18 16 15   Temp: 98.4 [name] (36.9 C)      TempSrc: Oral     SpO2: 98% 98% 96% 93%    Intake/Output Summary (Last 24 hours) at 02/18/2020 1044 Last data filed at 02/17/2020 1955 Gross per 24 hour  Intake 3 ml  Output -  Net 3 ml   There were no vitals filed for this visit.  Examination: General appearance:  adult female, alert and in moderate distress from anxiety.   HEENT: Anicteric, conjunctiva pink, lids and lashes normal. No nasal deformity, discharge, epistaxis.  Lips moist.   Skin: Warm and dry.  no jaundice.  No suspicious rashes or lesions. Cardiac: RRR, nl S1-S2, no murmurs appreciated.  Capillary refill is brisk. No LE edema.  Radial pulses 2+ and symmetric. Respiratory: Normal respiratory rate and rhythm.  CTAB without rales or wheezes. Abdomen: Abdomen soft.  No TTP or guarding. No ascites, distension, hepatosplenomegaly.   MSK: No deformities or effusions. Neuro: Awake and alert.  EOMI, moves all extremities with normal strength and cordination. Speech halting and searching for words and mostly incoherent.    Psych: Sensorium intact and responding to questions or attemptin to, attention normal. Affect sad.  Judgment and insight appear impaired.    Data Reviewed: I have personally reviewed following labs and imaging studies:  CBC: Recent Labs  Lab 02/17/20 1322 02/17/20 1344  WBC 6.0  --   NEUTROABS 4.5  --   HGB 14.7 16.7*  HCT 47.7* 49.0*  MCV 87.7  --   PLT 255  --    Basic Metabolic Panel: Recent Labs  Lab 02/17/20 1322 02/17/20 1344  NA 141 143  K 3.9 3.7  CL 106 107  CO2 23  --   GLUCOSE 140* 132*  BUN 16 19  CREATININE 0.78 0.70  CALCIUM 9.4  --    GFR: CrCl cannot be calculated (Unknown ideal weight.). Liver Function Tests: Recent Labs  Lab 02/17/20 1322  AST 31  ALT 31  ALKPHOS 87  BILITOT <0.1*  PROT 7.8  ALBUMIN 4.5   No results for input(s): LIPASE, AMYLASE in the last 168 hours. No results for input(s): AMMONIA in the last 168 hours. Coagulation Profile: Recent Labs   Lab 02/17/20 1322  INR 0.9   Cardiac Enzymes: No results for input(s): CKTOTAL, CKMB, CKMBINDEX, TROPONINI in the last 168 hours. BNP (last 3 results) No results for input(s): PROBNP in the last 8760 hours. HbA1C: Recent Labs    02/18/20 0541  HGBA1C 5.7*   CBG: Recent Labs  Lab 02/17/20 1656  GLUCAP 102*   Lipid Profile: Recent Labs    02/18/20 0541  CHOL 215*  HDL 71  LDLCALC 134*  TRIG 50  CHOLHDL 3.0   Thyroid Function Tests: No results for input(s): TSH, T4TOTAL, FREET4, T3FREE, THYROIDAB in the last 72 hours. Anemia Panel: No results for input(s): VITAMINB12, FOLATE, FERRITIN, TIBC, IRON, RETICCTPCT in the last 72 hours. Urine analysis:    Component Value Date/Time   COLORURINE yellow 09/23/2009 0814   APPEARANCEUR Clear 09/23/2009 0814   LABSPEC 1.020 09/23/2009 0814   PHURINE 6.5 09/23/2009 0814   HGBUR negative 09/23/2009 0814   BILIRUBINUR n 04/11/2017 1231   PROTEINUR n 04/11/2017 1231   UROBILINOGEN 0.2 04/11/2017 1231   UROBILINOGEN 0.2 09/23/2009 0814   NITRITE n 04/11/2017 1231   NITRITE negative 09/23/2009 0814   LEUKOCYTESUR Negative 04/11/2017 1231   Sepsis Labs: @LABRCNTIP (procalcitonin:4,lacticacidven:4)  ) Recent Results (from the past 240 hour(s))  Resp Panel by RT-PCR (Flu A&B, Covid) Nasopharyngeal Swab     Status: None   Collection Time: 02/17/20  5:28 PM   Specimen: Nasopharyngeal Swab; Nasopharyngeal(NP) swabs in vial transport medium  Result Value Ref Range Status   SARS Coronavirus 2 by RT PCR NEGATIVE NEGATIVE Final    Comment: (NOTE) SARS-CoV-2 target nucleic acids are NOT DETECTED.  The SARS-CoV-2 RNA is generally detectable in upper respiratory specimens during the acute phase of infection. The lowest concentration of SARS-CoV-2 viral copies this assay can detect is 138 copies/mL. A negative result does not preclude SARS-Cov-2 infection and should not be used as the sole basis for treatment or other patient  management decisions. A negative result may occur with  improper specimen collection/handling, submission of specimen other than nasopharyngeal swab, presence of viral mutation(s) within the areas targeted by this assay, and inadequate number of viral copies(<138 copies/mL). A negative result must be combined with clinical observations, patient history, and epidemiological information. The expected result is Negative.  Fact Sheet for Patients:  EntrepreneurPulse.com.au  Fact Sheet for Healthcare Providers:  IncredibleEmployment.be  This test is no t yet approved or cleared by the [place] FDA and  has been authorized for detection and/or diagnosis of SARS-CoV-2 by FDA under an Emergency Use Authorization (EUA). This EUA will remain  in effect (meaning this test can be used) for the  duration of the COVID-19 declaration under Section 564(b)(1) of the Act, 21 U.S.C.section 360bbb-3(b)(1), unless the authorization is terminated  or revoked sooner.       Influenza A by PCR NEGATIVE NEGATIVE Final   Influenza B by PCR NEGATIVE NEGATIVE Final    Comment: (NOTE) The Xpert Xpress SARS-CoV-2/FLU/RSV plus assay is intended as an aid in the diagnosis of influenza from Nasopharyngeal swab specimens and should not be used as a sole basis for treatment. Nasal washings and aspirates are unacceptable for Xpert Xpress SARS-CoV-2/FLU/RSV testing.  Fact Sheet for Patients: EntrepreneurPulse.com.au  Fact Sheet for Healthcare Providers: IncredibleEmployment.be  This test is not yet approved or cleared by the [place] FDA and has been authorized for detection and/or diagnosis of SARS-CoV-2 by FDA under an Emergency Use Authorization (EUA). This EUA will remain in effect (meaning this test can be used) for the duration of the COVID-19 declaration under Section 564(b)(1) of the Act, 21 U.S.C. section 360bbb-3(b)(1),  unless the authorization is terminated or revoked.  Performed at [facility], [place] [address], [place], [place] [address]          Radiology Studies: CT Angio Head W or Wo Contrast  Result Date: 02/17/2020 CLINICAL DATA:  Initial evaluation for neuro deficit, stroke suspected, expressive aphasia. EXAM: CT ANGIOGRAPHY HEAD AND NECK CT PERFUSION BRAIN TECHNIQUE: Multidetector CT imaging of the head and neck was performed using the standard protocol during bolus administration of intravenous contrast. Multiplanar CT image reconstructions and MIPs were obtained to evaluate the vascular anatomy. Carotid stenosis measurements (when applicable) are obtained utilizing NASCET criteria, using the distal internal carotid diameter as the denominator. Multiphase CT imaging of the brain was performed following IV bolus contrast injection. Subsequent parametric perfusion maps were calculated using RAPID software. CONTRAST:  145mL OMNIPAQUE IOHEXOL 350 MG/ML SOLN COMPARISON:  Prior head CT from earlier the same day. FINDINGS: CTA NECK FINDINGS Aortic arch: Visualized aortic arch of normal caliber with normal branch pattern. No hemodynamically significant stenosis seen about the origin of the great vessels. Right carotid system: Right CCA patent from its origin to the bifurcation without stenosis. Mild atheromatous change about the right bifurcation without significant stenosis. Right ICA widely patent distally without stenosis, dissection or occlusion. Left carotid system: Left CCA patent from its origin to the bifurcation without stenosis. Mild atheromatous plaque about the left bifurcation without significant stenosis. Left ICA widely patent distally without stenosis, dissection or occlusion. Vertebral arteries: Both vertebral arteries arise from subclavian arteries. Vertebral arteries widely patent without stenosis, dissection or occlusion. Skeleton: No visible acute osseous abnormality. No discrete or  worrisome osseous lesions. Other neck: No other acute soft tissue abnormality within the neck. No mass or adenopathy. Upper chest: Visualized upper chest demonstrates no acute finding. Review of the MIP images confirms the above findings CTA HEAD FINDINGS Anterior circulation: Both internal carotid arteries widely patent to the termini without stenosis. A1 segments widely patent. Normal anterior communicating artery complex. Anterior cerebral arteries widely patent to their distal aspects. No M1 stenosis or occlusion. Normal MCA bifurcations. Distal MCA branches well perfused and symmetric. Posterior circulation: Both V4 segments widely patent to the vertebrobasilar junction. Both PICA origins patent and normal. Basilar widely patent to its distal aspect without stenosis. Superior cerebellar arteries patent bilaterally. Both PCAs primarily supplied via the basilar well perfused to their distal aspects. Venous sinuses: Not well assessed due to timing of the contrast bolus. Anatomic variants: None significant.  No aneurysm. Review of the MIP  images confirms the above findings CT Brain Perfusion Findings: CBF (<30%) Volume: 53mL Perfusion (Tmax>6.0s) volume: 5mL Mismatch Volume: 63mL Infarction Location:Negative CT perfusion with no evidence for acute core infarct or other perfusion deficit. IMPRESSION: CTA HEAD AND NECK IMPRESSION: 1. Negative CTA of the head and neck with no evidence for large vessel occlusion. 2. Mild for age atheromatous plaque about the carotid bifurcations. No hemodynamically significant or correctable stenosis about the major arterial vasculature of the head and neck. CT PERFUSION IMPRESSION: Negative CT perfusion for acute ischemia or other perfusion abnormality. Electronically Signed   By: Jeannine Boga M.D.   On: 02/17/2020 21:18   DG Chest 2 View  Result Date: 02/17/2020 CLINICAL DATA:  80 year old female with acute ischemic stroke. EXAM: CHEST - 2 VIEW COMPARISON:  None. FINDINGS:  Minimal bibasilar atelectasis. No focal consolidation, pleural effusion or pneumothorax. The cardiac silhouette is within limits. No acute osseous pathology. IMPRESSION: No active cardiopulmonary disease. Electronically Signed   By: Anner Crete M.D.   On: 02/17/2020 21:32   CT HEAD WO CONTRAST  Result Date: 02/17/2020 CLINICAL DATA:  Expressive aphasia. EXAM: CT HEAD WITHOUT CONTRAST TECHNIQUE: Contiguous axial images were obtained from the base of the skull through the vertex without intravenous contrast. COMPARISON:  MRI brain dated March 18, 2017. FINDINGS: Brain: Punctate focus of hyperdensity in the right cerebellum (series 5, image 48; series 6, image 24). No evidence of acute infarction, hydrocephalus, extra-axial collection or mass lesion/mass effect. Stable atrophy and severe chronic microvascular ischemic changes. Vascular: Atherosclerotic vascular calcification of the carotid siphons. No hyperdense vessel. Skull: Normal. Negative for fracture or focal lesion. Sinuses/Orbits: No acute finding. Other: None. IMPRESSION: 1. Punctate focus of hyperdensity in the right cerebellum, likely microhemorrhage related to patient's underlying severe amyloid angiopathy as demonstrated on prior MRI. 2. Stable atrophy and severe chronic microvascular ischemic changes. Electronically Signed   By: Titus Dubin M.D.   On: 02/17/2020 14:21   CT Angio Neck W and/or Wo Contrast  Result Date: 02/17/2020 CLINICAL DATA:  Initial evaluation for neuro deficit, stroke suspected, expressive aphasia. EXAM: CT ANGIOGRAPHY HEAD AND NECK CT PERFUSION BRAIN TECHNIQUE: Multidetector CT imaging of the head and neck was performed using the standard protocol during bolus administration of intravenous contrast. Multiplanar CT image reconstructions and MIPs were obtained to evaluate the vascular anatomy. Carotid stenosis measurements (when applicable) are obtained utilizing NASCET criteria, using the distal internal carotid  diameter as the denominator. Multiphase CT imaging of the brain was performed following IV bolus contrast injection. Subsequent parametric perfusion maps were calculated using RAPID software. CONTRAST:  160mL OMNIPAQUE IOHEXOL 350 MG/ML SOLN COMPARISON:  Prior head CT from earlier the same day. FINDINGS: CTA NECK FINDINGS Aortic arch: Visualized aortic arch of normal caliber with normal branch pattern. No hemodynamically significant stenosis seen about the origin of the great vessels. Right carotid system: Right CCA patent from its origin to the bifurcation without stenosis. Mild atheromatous change about the right bifurcation without significant stenosis. Right ICA widely patent distally without stenosis, dissection or occlusion. Left carotid system: Left CCA patent from its origin to the bifurcation without stenosis. Mild atheromatous plaque about the left bifurcation without significant stenosis. Left ICA widely patent distally without stenosis, dissection or occlusion. Vertebral arteries: Both vertebral arteries arise from subclavian arteries. Vertebral arteries widely patent without stenosis, dissection or occlusion. Skeleton: No visible acute osseous abnormality. No discrete or worrisome osseous lesions. Other neck: No other acute soft tissue abnormality within the neck. No mass  or adenopathy. Upper chest: Visualized upper chest demonstrates no acute finding. Review of the MIP images confirms the above findings CTA HEAD FINDINGS Anterior circulation: Both internal carotid arteries widely patent to the termini without stenosis. A1 segments widely patent. Normal anterior communicating artery complex. Anterior cerebral arteries widely patent to their distal aspects. No M1 stenosis or occlusion. Normal MCA bifurcations. Distal MCA branches well perfused and symmetric. Posterior circulation: Both V4 segments widely patent to the vertebrobasilar junction. Both PICA origins patent and normal. Basilar widely patent to  its distal aspect without stenosis. Superior cerebellar arteries patent bilaterally. Both PCAs primarily supplied via the basilar well perfused to their distal aspects. Venous sinuses: Not well assessed due to timing of the contrast bolus. Anatomic variants: None significant.  No aneurysm. Review of the MIP images confirms the above findings CT Brain Perfusion Findings: CBF (<30%) Volume: 56mL Perfusion (Tmax>6.0s) volume: 21mL Mismatch Volume: 31mL Infarction Location:Negative CT perfusion with no evidence for acute core infarct or other perfusion deficit. IMPRESSION: CTA HEAD AND NECK IMPRESSION: 1. Negative CTA of the head and neck with no evidence for large vessel occlusion. 2. Mild for age atheromatous plaque about the carotid bifurcations. No hemodynamically significant or correctable stenosis about the major arterial vasculature of the head and neck. CT PERFUSION IMPRESSION: Negative CT perfusion for acute ischemia or other perfusion abnormality. Electronically Signed   By: Jeannine Boga M.D.   On: 02/17/2020 21:18   MR BRAIN WO CONTRAST  Result Date: 02/17/2020 CLINICAL DATA:  Initial evaluation for acute neuro deficit, stroke suspected. EXAM: MRI HEAD WITHOUT CONTRAST TECHNIQUE: Multiplanar, multiecho pulse sequences of the brain and surrounding structures were obtained without intravenous contrast. COMPARISON:  Prior CT and CT perfusion from earlier the same day. Comparison also made with previous MRI from 03/18/2017. FINDINGS: Brain: Diffuse prominence of the CSF containing spaces compatible with generalized age-related cerebral atrophy. Patchy T2/FLAIR hyperintensity within the periventricular and deep white matter both cerebral hemispheres consistent with chronic microvascular ischemic disease, moderately advanced in nature. 1.7 cm acute ischemic nonhemorrhagic infarcts seen involving the left frontal corona radiata. No associated hemorrhage or mass effect. No other evidence for acute or subacute  ischemia. Gray-white matter differentiation otherwise maintained. No acute intracranial hemorrhage. Again seen are innumerable chronic micro hemorrhages scattered throughout both cerebral and cerebellar hemispheres, predominantly peripheral in location, and most consistent with severe cerebral amyloid angiopathy. Superimposed areas of encephalomalacia with chronic hemosiderin staining at the posterior right frontal, right parietal, and left occipital lobes likely reflect remote hemorrhages, likely related to underlying angiopathy. No mass lesion, midline shift or mass effect. Mild diffuse ventricular prominence related to global parenchymal volume loss without hydrocephalus. No extra-axial fluid collection. Pituitary gland and suprasellar region within normal limits. Midline structures intact. Small pericallosal lipoma noted. Vascular: Major intracranial vascular flow voids are maintained. Skull and upper cervical spine: Craniocervical junction normal. Bone marrow signal intensity within normal limits. No scalp soft tissue abnormality. Sinuses/Orbits: Patient status post bilateral ocular lens replacement. Globes and orbital soft tissues demonstrate no acute finding. Paranasal sinuses are clear. Trace bilateral mastoid effusions, of doubtful significance. Other: None. IMPRESSION: 1. 1.7 cm acute ischemic nonhemorrhagic infarct involving the left frontal corona radiata. 2. Innumerable chronic micro hemorrhages scattered throughout both cerebral and cerebellar hemispheres, most consistent with severe cerebral amyloid angiopathy. 3. Underlying age-related cerebral atrophy with moderately advanced chronic microvascular ischemic disease. Electronically Signed   By: Jeannine Boga M.D.   On: 02/17/2020 21:25   CT CEREBRAL PERFUSION W CONTRAST  Result Date: 02/17/2020 CLINICAL DATA:  Initial evaluation for neuro deficit, stroke suspected, expressive aphasia. EXAM: CT ANGIOGRAPHY HEAD AND NECK CT PERFUSION BRAIN  TECHNIQUE: Multidetector CT imaging of the head and neck was performed using the standard protocol during bolus administration of intravenous contrast. Multiplanar CT image reconstructions and MIPs were obtained to evaluate the vascular anatomy. Carotid stenosis measurements (when applicable) are obtained utilizing NASCET criteria, using the distal internal carotid diameter as the denominator. Multiphase CT imaging of the brain was performed following IV bolus contrast injection. Subsequent parametric perfusion maps were calculated using RAPID software. CONTRAST:  149mL OMNIPAQUE IOHEXOL 350 MG/ML SOLN COMPARISON:  Prior head CT from earlier the same day. FINDINGS: CTA NECK FINDINGS Aortic arch: Visualized aortic arch of normal caliber with normal branch pattern. No hemodynamically significant stenosis seen about the origin of the great vessels. Right carotid system: Right CCA patent from its origin to the bifurcation without stenosis. Mild atheromatous change about the right bifurcation without significant stenosis. Right ICA widely patent distally without stenosis, dissection or occlusion. Left carotid system: Left CCA patent from its origin to the bifurcation without stenosis. Mild atheromatous plaque about the left bifurcation without significant stenosis. Left ICA widely patent distally without stenosis, dissection or occlusion. Vertebral arteries: Both vertebral arteries arise from subclavian arteries. Vertebral arteries widely patent without stenosis, dissection or occlusion. Skeleton: No visible acute osseous abnormality. No discrete or worrisome osseous lesions. Other neck: No other acute soft tissue abnormality within the neck. No mass or adenopathy. Upper chest: Visualized upper chest demonstrates no acute finding. Review of the MIP images confirms the above findings CTA HEAD FINDINGS Anterior circulation: Both internal carotid arteries widely patent to the termini without stenosis. A1 segments widely  patent. Normal anterior communicating artery complex. Anterior cerebral arteries widely patent to their distal aspects. No M1 stenosis or occlusion. Normal MCA bifurcations. Distal MCA branches well perfused and symmetric. Posterior circulation: Both V4 segments widely patent to the vertebrobasilar junction. Both PICA origins patent and normal. Basilar widely patent to its distal aspect without stenosis. Superior cerebellar arteries patent bilaterally. Both PCAs primarily supplied via the basilar well perfused to their distal aspects. Venous sinuses: Not well assessed due to timing of the contrast bolus. Anatomic variants: None significant.  No aneurysm. Review of the MIP images confirms the above findings CT Brain Perfusion Findings: CBF (<30%) Volume: 36mL Perfusion (Tmax>6.0s) volume: 72mL Mismatch Volume: 60mL Infarction Location:Negative CT perfusion with no evidence for acute core infarct or other perfusion deficit. IMPRESSION: CTA HEAD AND NECK IMPRESSION: 1. Negative CTA of the head and neck with no evidence for large vessel occlusion. 2. Mild for age atheromatous plaque about the carotid bifurcations. No hemodynamically significant or correctable stenosis about the major arterial vasculature of the head and neck. CT PERFUSION IMPRESSION: Negative CT perfusion for acute ischemia or other perfusion abnormality. Electronically Signed   By: Jeannine Boga M.D.   On: 02/17/2020 21:18        Scheduled Meds: . latanoprost  1 drop Both Eyes Daily  . mirtazapine  7.5 mg Oral QHS  . pravastatin  10 mg Oral q1800   Continuous Infusions: . lactated ringers       LOS: 1 day    Time spent: 25 minutes    Edwin Dada, MD [facility] 02/18/2020, 10:44 AM     Please page though [place] or Epic secure chat:  For [employer], Adult nurse

## 2020-02-18 NOTE — Progress Notes (Signed)
  Echocardiogram 2D Echocardiogram has been performed.  Bobbye Charleston 02/18/2020, 3:00 PM

## 2020-02-19 ENCOUNTER — Encounter: Payer: Self-pay | Admitting: Adult Health

## 2020-02-19 ENCOUNTER — Telehealth: Payer: Self-pay | Admitting: *Deleted

## 2020-02-19 DIAGNOSIS — R4701 Aphasia: Secondary | ICD-10-CM | POA: Diagnosis not present

## 2020-02-19 MED ORDER — HALOPERIDOL LACTATE 5 MG/ML IJ SOLN
1.0000 mg | Freq: Once | INTRAMUSCULAR | Status: AC
  Administered 2020-02-19: 1 mg via INTRAMUSCULAR

## 2020-02-19 MED ORDER — HALOPERIDOL LACTATE 5 MG/ML IJ SOLN
1.0000 mg | Freq: Once | INTRAMUSCULAR | Status: AC | PRN
  Administered 2020-02-19: 1 mg via INTRAVENOUS
  Filled 2020-02-19: qty 1

## 2020-02-19 NOTE — Evaluation (Signed)
Occupational Therapy Evaluation Patient Details Name: Carmen [name] MRN: [id number] DOB: [birth year] Today's Date: 02/19/2020    History of Present Illness Pt adm 02/17/20 with new onset aphasia. 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 PMH - anxiety, amyloid angiopathy, dementia.   Clinical Impression   PTA patient was living at an ALF and was completing dressing/bathing/toielting tasks without external assist and no AD. Son present at bedside to provide PLOF as patient has cognitive deficits at baseline 2/2 dementia. Patient currently functioning slightly below baseline requiring Min guard to supervision A for UB/LB dressing, 2/3 grooming tasks standing at sink level and short distance functional mobility in hospital room. Son present at bedside also notes patient with increased need for cueing this date compared to baseline. Son expressed interest in [facility] to maximize safety and independence. OT in agreement. Patient would benefit from continued acute OT services in prep for safe d/c back to ALF with recommendation for 24hr supervision/assist.     Follow Up Recommendations  Home health OT;Supervision/Assistance - 24 hour    Equipment Recommendations  None recommended by OT    Recommendations for Other Services       Precautions / Restrictions Precautions Precautions: Fall Restrictions Weight Bearing Restrictions: No      Mobility Bed Mobility Overal bed mobility: Modified Independent             General bed mobility comments: Supine <> EOB with Mod I.    Transfers Overall transfer level: Needs assistance Equipment used: None Transfers: Sit to/from Stand Sit to Stand: Supervision         General transfer comment: supervision for safety    Balance Overall balance assessment: Needs assistance Sitting-balance support: No upper extremity supported;Feet supported Sitting balance-Leahy Scale: Good     Standing balance support: No upper extremity  supported;During functional activity Standing balance-Leahy Scale: Fair Standing balance comment: Hand held assist with short-distance mobiltiy in room. Patient occasionally bumping into furniture in room.                           ADL either performed or assessed with clinical judgement   ADL Overall ADL's : Needs assistance/impaired     Grooming: Min guard;Standing Grooming Details (indicate cue type and reason): 2/3 grooming tasks at sink level with moderate cues for sequencing.         Upper Body Dressing : Set up;Sitting Upper Body Dressing Details (indicate cue type and reason): Set-up assist and cues for orientation. Lower Body Dressing: Min guard;Sit to/from stand Lower Body Dressing Details (indicate cue type and reason): Patient able to thread BLE seated EOB. Min guard for steadying in standing and cues for safety as patient initially attempting to don LB clothing in standing. Toilet Transfer: Magazine features editor Details (indicate cue type and reason): Simulated with stand-pivot transfer to EOB.         Functional mobility during ADLs: Min guard General ADL Comments: Min guard for short-distance functional mobility in room with hand held asssit. No use of AD.     Vision   Vision Assessment?: No apparent visual deficits Additional Comments: History of peripheral vision deficits.     Perception     Praxis      Pertinent Vitals/Pain Pain Assessment: No/denies pain     Hand Dominance Right   Extremity/Trunk Assessment Upper Extremity Assessment Upper Extremity Assessment: Overall WFL for tasks assessed   Lower Extremity Assessment Lower Extremity Assessment:  Overall [facility] for tasks assessed       Communication Communication Communication: Receptive difficulties;Expressive difficulties (see SLP note)   Cognition Arousal/Alertness: Awake/alert Behavior During Therapy: Anxious Overall Cognitive Status: History of cognitive impairments - at  baseline                                 General Comments: Pt with dementia at baseline. Son reports that patient could tell time from digital clock and could identify family members without cueing. Patient uanble to do either this date.   General Comments  Son present at bedside.    Exercises     Shoulder Instructions      Home Living Family/patient expects to be discharged to:: Assisted living   Available Help at Discharge: Other (Comment) (staff) Type of Home: Assisted living                       Home Equipment: None   Additional Comments: Pt unreliable historian      Prior Functioning/Environment Level of Independence: Needs assistance  Gait / Transfers Assistance Needed: No use of AD at baseline per son present at bedside. Patient with lateral lean but otherwise no difficulty ambulating. ADL's / Homemaking Assistance Needed: Per son present at bedside, patient was independent with bathing/dressing without use of AD.            OT Problem List: Impaired balance (sitting and/or standing);Decreased safety awareness      OT Treatment/Interventions: Self-care/ADL training;Therapeutic exercise;Therapeutic activities;Cognitive remediation/compensation;Balance training    OT Goals(Current goals can be found in the care plan section) Acute Rehab OT Goals Patient Stated Goal: To go home OT Goal Formulation: With patient/family Time For Goal Achievement: 03/04/20 Potential to Achieve Goals: Good ADL Goals Additional ADL Goal #1: Patient will complete morning ADLs with supervision A for safety. Additional ADL Goal #2: Patient will complete short-distance functional mobility with Mod I and no LOB in prep for ADLs and ADL transfers.  OT Frequency: Min 2X/week   Barriers to D/C:            Co-evaluation              AM-PAC OT "6 Clicks" Daily Activity     Outcome Measure Help from another person eating meals?: A Little Help from another  person taking care of personal grooming?: A Little Help from another person toileting, which includes using toliet, bedpan, or urinal?: A Little Help from another person bathing (including washing, rinsing, drying)?: A Little Help from another person to put on and taking off regular upper body clothing?: A Little Help from another person to put on and taking off regular lower body clothing?: A Little 6 Click Score: 18   End of Session Equipment Utilized During Treatment: Gait belt  Activity Tolerance: Patient tolerated treatment well Patient left: in bed;with call bell/phone within reach;with family/visitor present  OT Visit Diagnosis: Unsteadiness on feet (R26.81)                Time: 6222-9798 OT Time Calculation (min): 23 min Charges:  OT General Charges $OT Visit: 1 Visit OT Evaluation $OT Eval Low Complexity: 1 Low OT Treatments $Self Care/Home Management : 8-22 mins  Anslie Spadafora H. OTR/L Supplemental OT, Department of rehab services [phone]  Latonyia Lopata R H. 02/19/2020, 11:26 AM

## 2020-02-19 NOTE — Plan of Care (Signed)
  Problem: Education: Goal: Knowledge of General Education information will improve Description: Including pain rating scale, medication(s)/side effects and non-pharmacologic comfort measures 02/19/2020 1315 by Myriam Forehand, RN Outcome: Adequate for Discharge 02/19/2020 1315 by Myriam Forehand, RN Outcome: Adequate for Discharge   Problem: Health Behavior/Discharge Planning: Goal: Ability to manage health-related needs will improve 02/19/2020 1315 by Myriam Forehand, RN Outcome: Adequate for Discharge 02/19/2020 1315 by Myriam Forehand, RN Outcome: Adequate for Discharge   Problem: Clinical Measurements: Goal: Ability to maintain clinical measurements within normal limits will improve 02/19/2020 1315 by Myriam Forehand, RN Outcome: Adequate for Discharge 02/19/2020 1315 by Myriam Forehand, RN Outcome: Adequate for Discharge Goal: Will remain free from infection 02/19/2020 1315 by Myriam Forehand, RN Outcome: Adequate for Discharge 02/19/2020 1315 by Myriam Forehand, RN Outcome: Adequate for Discharge Goal: Diagnostic test results will improve 02/19/2020 1315 by Myriam Forehand, RN Outcome: Adequate for Discharge 02/19/2020 1315 by Myriam Forehand, RN Outcome: Adequate for Discharge Goal: Respiratory complications will improve 02/19/2020 1315 by Myriam Forehand, RN Outcome: Adequate for Discharge 02/19/2020 1315 by Myriam Forehand, RN Outcome: Adequate for Discharge Goal: Cardiovascular complication will be avoided 02/19/2020 1315 by Myriam Forehand, RN Outcome: Adequate for Discharge 02/19/2020 1315 by Myriam Forehand, RN Outcome: Adequate for Discharge   Problem: Activity: Goal: Risk for activity intolerance will decrease 02/19/2020 1315 by Myriam Forehand, RN Outcome: Adequate for Discharge 02/19/2020 1315 by Myriam Forehand, RN Outcome: Adequate for Discharge   Problem: Nutrition: Goal: Adequate nutrition will be maintained 02/19/2020 1315 by Myriam Forehand, RN Outcome: Adequate for Discharge 02/19/2020 1315  by Myriam Forehand, RN Outcome: Adequate for Discharge   Problem: Coping: Goal: Level of anxiety will decrease 02/19/2020 1315 by Myriam Forehand, RN Outcome: Adequate for Discharge 02/19/2020 1315 by Myriam Forehand, RN Outcome: Adequate for Discharge   Problem: Elimination: Goal: Will not experience complications related to bowel motility 02/19/2020 1315 by Myriam Forehand, RN Outcome: Adequate for Discharge 02/19/2020 1315 by Myriam Forehand, RN Outcome: Adequate for Discharge Goal: Will not experience complications related to urinary retention 02/19/2020 1315 by Myriam Forehand, RN Outcome: Adequate for Discharge 02/19/2020 1315 by Myriam Forehand, RN Outcome: Adequate for Discharge   Problem: Pain Managment: Goal: General experience of comfort will improve 02/19/2020 1315 by Myriam Forehand, RN Outcome: Adequate for Discharge 02/19/2020 1315 by Myriam Forehand, RN Outcome: Adequate for Discharge   Problem: Safety: Goal: Ability to remain free from injury will improve 02/19/2020 1315 by Myriam Forehand, RN Outcome: Adequate for Discharge 02/19/2020 1315 by Myriam Forehand, RN Outcome: Adequate for Discharge   Problem: Skin Integrity: Goal: Risk for impaired skin integrity will decrease 02/19/2020 1315 by Myriam Forehand, RN Outcome: Adequate for Discharge 02/19/2020 1315 by Myriam Forehand, RN Outcome: Adequate for Discharge

## 2020-02-19 NOTE — Telephone Encounter (Signed)
Transition Care Management Follow-Up Telephone Call   Date discharged and where:02/19/2020 [place]  How have you been since you were released from the hospital? Better, still having difficulty with finding her words.   Any patient concerns? No  Items Reviewed:   Meds: Yes. [facility] manages  Allergies:Yes  Dietary Changes Reviewed:Yes  Functional Questionnaire:  Independent-I Dependent-D  ADLs: I   Dressing- I    Eating-I   Maintaining continence-I   Transferring-I   Transportation-D   Meal Prep-D   Managing Meds- D  Confirmed importance and Date/Time of follow-up visits scheduled: Called and spoke with Autumn [phone] and she stated that she will let Manuela Schwartz know and they will get an appointment set up.    Confirmed with patient if condition worsens to call PCP or go to the Emergency Dept. Patient was given office number and encouraged to call back with questions or concerns:  Yes

## 2020-02-19 NOTE — Telephone Encounter (Signed)
error 

## 2020-02-19 NOTE — Discharge Summary (Signed)
Physician Discharge Summary  Carmen [name] [id number] DOB: 01-27-1941 DOA: 02/17/2020  PCP: [name], DO  Admit date: 02/17/2020 Discharge date: 02/19/2020  Admitted From: ALF  Disposition:  ALF   Recommendations for Outpatient Follow-up:  1. Follow up with PCP in 1-2 weeks 2. Follow up with Neurology in 4-6 weeks 3. Dr. Mariea [name]: Given mostly normal BP without antihypertensives in the hospital, no new antihypertensives were added; ambulatory BP monitoring would not be unreasonable to further characterize pattern and frequency of BP fluctuations, depending on patient's personal preference for intensive monitoring      Home Health: PT/OT due to balance issues Equipment/Devices: None new  Discharge Condition: Good  CODE STATUS: DNR Diet recommendation: Cardiac  Brief/Interim Summary: Carmen [name] is a 80 y.o. F with amyloid angiopathy, anxiety, melanoma, pernicious anemia who presented with aphasia.   Patient noted with the inability to speak properly the morning of admission.  Last normal was night before at 7:30pm.  On MRI in the ER, she had a new 1cm L corona radiata infarct.     PRINCIPAL HOSPITAL DIAGNOSIS: Acute ischemic stroke    Discharge Diagnoses:   Acute ischemic stroke LEFT frontal corona radiata due to cerebral amyloid angiopathy MRI confirmed stroke.  Non-invasive angiography showed no significant stenoses on CT angiography of the head and neck, including carotids.  Echocardiogram showed no cardiogenic source of embolism.  Telemetry showed no evidence of Afib.  Patient seen by Neurology who recommended continued low dose aspirin and pravastatin.  Patient's BP normal without antihypertensives, although lability was noted.   Anxiety  Glaucoma           Discharge Instructions  Discharge Instructions    Ambulatory referral to Neurology   Complete by: As directed    Follow up with Dr. Rexene Alberts at [facility] in 4 weeks. Pt is Dr. Guadelupe Sabin pt.  Thanks.   Diet - low sodium heart healthy   Complete by: As directed    Discharge instructions   Complete by: As directed    From Dr. Loleta Books: You were admitted for a small stroke This was likely the result of your cerebral amyloid angiopathy  You are already doing everything you can to prevent strokes from CAA, and so the small size of this stroke is likely a reflection of that hard work  Continue your pravastatin and baby aspirin  Follow up with Dr. Rexene Alberts as recommended by Dr. Erlinda Hong   Increase activity slowly   Complete by: As directed      Allergies as of 02/19/2020      Reactions   Sulfonamide Derivatives Other (See Comments)   Doesn't remember      Medication List    TAKE these medications   aspirin EC 81 MG tablet Take 81 mg by mouth daily.   cyanocobalamin 1000 MCG/ML injection Commonly known as: (VITAMIN B-12) INJECT 56mL MONTHLY What changed:   how much to take  how to take this  when to take this  additional instructions   LORazepam 0.5 MG tablet Commonly known as: ATIVAN Take 1 tablet (0.5 mg total) by mouth daily as needed for anxiety.   Lumigan 0.01 % Soln Generic drug: bimatoprost Place 1 drop into both eyes daily.   mirtazapine 7.5 MG tablet Commonly known as: REMERON Take 1 tablet (7.5 mg total) by mouth at bedtime.   pravastatin 10 MG tablet Commonly known as: PRAVACHOL TAKE ONE TABLET DAILY       Follow-up Information    Star Age,  MD. Schedule an appointment as soon as possible for a visit in 4 week(s).   Specialties: Neurology, Radiology Contact information: [address] [place] [address] [phone]              Allergies  Allergen Reactions  . Sulfonamide Derivatives Other (See Comments)    Doesn't remember    Consultations:  Neurology   Procedures/Studies: CT Angio Head W or Wo Contrast  Result Date: 02/17/2020 CLINICAL DATA:  Initial evaluation for neuro deficit, stroke suspected,  expressive aphasia. EXAM: CT ANGIOGRAPHY HEAD AND NECK CT PERFUSION BRAIN TECHNIQUE: Multidetector CT imaging of the head and neck was performed using the standard protocol during bolus administration of intravenous contrast. Multiplanar CT image reconstructions and MIPs were obtained to evaluate the vascular anatomy. Carotid stenosis measurements (when applicable) are obtained utilizing NASCET criteria, using the distal internal carotid diameter as the denominator. Multiphase CT imaging of the brain was performed following IV bolus contrast injection. Subsequent parametric perfusion maps were calculated using RAPID software. CONTRAST:  184mL OMNIPAQUE IOHEXOL 350 MG/ML SOLN COMPARISON:  Prior head CT from earlier the same day. FINDINGS: CTA NECK FINDINGS Aortic arch: Visualized aortic arch of normal caliber with normal branch pattern. No hemodynamically significant stenosis seen about the origin of the great vessels. Right carotid system: Right CCA patent from its origin to the bifurcation without stenosis. Mild atheromatous change about the right bifurcation without significant stenosis. Right ICA widely patent distally without stenosis, dissection or occlusion. Left carotid system: Left CCA patent from its origin to the bifurcation without stenosis. Mild atheromatous plaque about the left bifurcation without significant stenosis. Left ICA widely patent distally without stenosis, dissection or occlusion. Vertebral arteries: Both vertebral arteries arise from subclavian arteries. Vertebral arteries widely patent without stenosis, dissection or occlusion. Skeleton: No visible acute osseous abnormality. No discrete or worrisome osseous lesions. Other neck: No other acute soft tissue abnormality within the neck. No mass or adenopathy. Upper chest: Visualized upper chest demonstrates no acute finding. Review of the MIP images confirms the above findings CTA HEAD FINDINGS Anterior circulation: Both internal carotid  arteries widely patent to the termini without stenosis. A1 segments widely patent. Normal anterior communicating artery complex. Anterior cerebral arteries widely patent to their distal aspects. No M1 stenosis or occlusion. Normal MCA bifurcations. Distal MCA branches well perfused and symmetric. Posterior circulation: Both V4 segments widely patent to the vertebrobasilar junction. Both PICA origins patent and normal. Basilar widely patent to its distal aspect without stenosis. Superior cerebellar arteries patent bilaterally. Both PCAs primarily supplied via the basilar well perfused to their distal aspects. Venous sinuses: Not well assessed due to timing of the contrast bolus. Anatomic variants: None significant.  No aneurysm. Review of the MIP images confirms the above findings CT Brain Perfusion Findings: CBF (<30%) Volume: 71mL Perfusion (Tmax>6.0s) volume: 52mL Mismatch Volume: 78mL Infarction Location:Negative CT perfusion with no evidence for acute core infarct or other perfusion deficit. IMPRESSION: CTA HEAD AND NECK IMPRESSION: 1. Negative CTA of the head and neck with no evidence for large vessel occlusion. 2. Mild for age atheromatous plaque about the carotid bifurcations. No hemodynamically significant or correctable stenosis about the major arterial vasculature of the head and neck. CT PERFUSION IMPRESSION: Negative CT perfusion for acute ischemia or other perfusion abnormality. Electronically Signed   By: Jeannine Boga M.D.   On: 02/17/2020 21:18   DG Chest 2 View  Result Date: 02/17/2020 CLINICAL DATA:  80 year old female with acute ischemic stroke. EXAM: CHEST -  2 VIEW COMPARISON:  None. FINDINGS: Minimal bibasilar atelectasis. No focal consolidation, pleural effusion or pneumothorax. The cardiac silhouette is within limits. No acute osseous pathology. IMPRESSION: No active cardiopulmonary disease. Electronically Signed   By: Anner Crete M.D.   On: 02/17/2020 21:32   CT HEAD WO  CONTRAST  Result Date: 02/17/2020 CLINICAL DATA:  Expressive aphasia. EXAM: CT HEAD WITHOUT CONTRAST TECHNIQUE: Contiguous axial images were obtained from the base of the skull through the vertex without intravenous contrast. COMPARISON:  MRI brain dated March 18, 2017. FINDINGS: Brain: Punctate focus of hyperdensity in the right cerebellum (series 5, image 48; series 6, image 24). No evidence of acute infarction, hydrocephalus, extra-axial collection or mass lesion/mass effect. Stable atrophy and severe chronic microvascular ischemic changes. Vascular: Atherosclerotic vascular calcification of the carotid siphons. No hyperdense vessel. Skull: Normal. Negative for fracture or focal lesion. Sinuses/Orbits: No acute finding. Other: None. IMPRESSION: 1. Punctate focus of hyperdensity in the right cerebellum, likely microhemorrhage related to patient's underlying severe amyloid angiopathy as demonstrated on prior MRI. 2. Stable atrophy and severe chronic microvascular ischemic changes. Electronically Signed   By: Titus Dubin M.D.   On: 02/17/2020 14:21   CT Angio Neck W and/or Wo Contrast  Result Date: 02/17/2020 CLINICAL DATA:  Initial evaluation for neuro deficit, stroke suspected, expressive aphasia. EXAM: CT ANGIOGRAPHY HEAD AND NECK CT PERFUSION BRAIN TECHNIQUE: Multidetector CT imaging of the head and neck was performed using the standard protocol during bolus administration of intravenous contrast. Multiplanar CT image reconstructions and MIPs were obtained to evaluate the vascular anatomy. Carotid stenosis measurements (when applicable) are obtained utilizing NASCET criteria, using the distal internal carotid diameter as the denominator. Multiphase CT imaging of the brain was performed following IV bolus contrast injection. Subsequent parametric perfusion maps were calculated using RAPID software. CONTRAST:  153mL OMNIPAQUE IOHEXOL 350 MG/ML SOLN COMPARISON:  Prior head CT from earlier the same day.  FINDINGS: CTA NECK FINDINGS Aortic arch: Visualized aortic arch of normal caliber with normal branch pattern. No hemodynamically significant stenosis seen about the origin of the great vessels. Right carotid system: Right CCA patent from its origin to the bifurcation without stenosis. Mild atheromatous change about the right bifurcation without significant stenosis. Right ICA widely patent distally without stenosis, dissection or occlusion. Left carotid system: Left CCA patent from its origin to the bifurcation without stenosis. Mild atheromatous plaque about the left bifurcation without significant stenosis. Left ICA widely patent distally without stenosis, dissection or occlusion. Vertebral arteries: Both vertebral arteries arise from subclavian arteries. Vertebral arteries widely patent without stenosis, dissection or occlusion. Skeleton: No visible acute osseous abnormality. No discrete or worrisome osseous lesions. Other neck: No other acute soft tissue abnormality within the neck. No mass or adenopathy. Upper chest: Visualized upper chest demonstrates no acute finding. Review of the MIP images confirms the above findings CTA HEAD FINDINGS Anterior circulation: Both internal carotid arteries widely patent to the termini without stenosis. A1 segments widely patent. Normal anterior communicating artery complex. Anterior cerebral arteries widely patent to their distal aspects. No M1 stenosis or occlusion. Normal MCA bifurcations. Distal MCA branches well perfused and symmetric. Posterior circulation: Both V4 segments widely patent to the vertebrobasilar junction. Both PICA origins patent and normal. Basilar widely patent to its distal aspect without stenosis. Superior cerebellar arteries patent bilaterally. Both PCAs primarily supplied via the basilar well perfused to their distal aspects. Venous sinuses: Not well assessed due to timing of the contrast bolus. Anatomic variants: None significant.  No  aneurysm.  Review of the MIP images confirms the above findings CT Brain Perfusion Findings: CBF (<30%) Volume: 50mL Perfusion (Tmax>6.0s) volume: 72mL Mismatch Volume: 51mL Infarction Location:Negative CT perfusion with no evidence for acute core infarct or other perfusion deficit. IMPRESSION: CTA HEAD AND NECK IMPRESSION: 1. Negative CTA of the head and neck with no evidence for large vessel occlusion. 2. Mild for age atheromatous plaque about the carotid bifurcations. No hemodynamically significant or correctable stenosis about the major arterial vasculature of the head and neck. CT PERFUSION IMPRESSION: Negative CT perfusion for acute ischemia or other perfusion abnormality. Electronically Signed   By: Jeannine Boga M.D.   On: 02/17/2020 21:18   MR BRAIN WO CONTRAST  Result Date: 02/17/2020 CLINICAL DATA:  Initial evaluation for acute neuro deficit, stroke suspected. EXAM: MRI HEAD WITHOUT CONTRAST TECHNIQUE: Multiplanar, multiecho pulse sequences of the brain and surrounding structures were obtained without intravenous contrast. COMPARISON:  Prior CT and CT perfusion from earlier the same day. Comparison also made with previous MRI from 03/18/2017. FINDINGS: Brain: Diffuse prominence of the CSF containing spaces compatible with generalized age-related cerebral atrophy. Patchy T2/FLAIR hyperintensity within the periventricular and deep white matter both cerebral hemispheres consistent with chronic microvascular ischemic disease, moderately advanced in nature. 1.7 cm acute ischemic nonhemorrhagic infarcts seen involving the left frontal corona radiata. No associated hemorrhage or mass effect. No other evidence for acute or subacute ischemia. Gray-white matter differentiation otherwise maintained. No acute intracranial hemorrhage. Again seen are innumerable chronic micro hemorrhages scattered throughout both cerebral and cerebellar hemispheres, predominantly peripheral in location, and most consistent with severe  cerebral amyloid angiopathy. Superimposed areas of encephalomalacia with chronic hemosiderin staining at the posterior right frontal, right parietal, and left occipital lobes likely reflect remote hemorrhages, likely related to underlying angiopathy. No mass lesion, midline shift or mass effect. Mild diffuse ventricular prominence related to global parenchymal volume loss without hydrocephalus. No extra-axial fluid collection. Pituitary gland and suprasellar region within normal limits. Midline structures intact. Small pericallosal lipoma noted. Vascular: Major intracranial vascular flow voids are maintained. Skull and upper cervical spine: Craniocervical junction normal. Bone marrow signal intensity within normal limits. No scalp soft tissue abnormality. Sinuses/Orbits: Patient status post bilateral ocular lens replacement. Globes and orbital soft tissues demonstrate no acute finding. Paranasal sinuses are clear. Trace bilateral mastoid effusions, of doubtful significance. Other: None. IMPRESSION: 1. 1.7 cm acute ischemic nonhemorrhagic infarct involving the left frontal corona radiata. 2. Innumerable chronic micro hemorrhages scattered throughout both cerebral and cerebellar hemispheres, most consistent with severe cerebral amyloid angiopathy. 3. Underlying age-related cerebral atrophy with moderately advanced chronic microvascular ischemic disease. Electronically Signed   By: Jeannine Boga M.D.   On: 02/17/2020 21:25   CT CEREBRAL PERFUSION W CONTRAST  Result Date: 02/17/2020 CLINICAL DATA:  Initial evaluation for neuro deficit, stroke suspected, expressive aphasia. EXAM: CT ANGIOGRAPHY HEAD AND NECK CT PERFUSION BRAIN TECHNIQUE: Multidetector CT imaging of the head and neck was performed using the standard protocol during bolus administration of intravenous contrast. Multiplanar CT image reconstructions and MIPs were obtained to evaluate the vascular anatomy. Carotid stenosis measurements (when  applicable) are obtained utilizing NASCET criteria, using the distal internal carotid diameter as the denominator. Multiphase CT imaging of the brain was performed following IV bolus contrast injection. Subsequent parametric perfusion maps were calculated using RAPID software. CONTRAST:  154mL OMNIPAQUE IOHEXOL 350 MG/ML SOLN COMPARISON:  Prior head CT from earlier the same day. FINDINGS: CTA NECK FINDINGS Aortic arch: Visualized aortic arch of normal caliber  with normal branch pattern. No hemodynamically significant stenosis seen about the origin of the great vessels. Right carotid system: Right CCA patent from its origin to the bifurcation without stenosis. Mild atheromatous change about the right bifurcation without significant stenosis. Right ICA widely patent distally without stenosis, dissection or occlusion. Left carotid system: Left CCA patent from its origin to the bifurcation without stenosis. Mild atheromatous plaque about the left bifurcation without significant stenosis. Left ICA widely patent distally without stenosis, dissection or occlusion. Vertebral arteries: Both vertebral arteries arise from subclavian arteries. Vertebral arteries widely patent without stenosis, dissection or occlusion. Skeleton: No visible acute osseous abnormality. No discrete or worrisome osseous lesions. Other neck: No other acute soft tissue abnormality within the neck. No mass or adenopathy. Upper chest: Visualized upper chest demonstrates no acute finding. Review of the MIP images confirms the above findings CTA HEAD FINDINGS Anterior circulation: Both internal carotid arteries widely patent to the termini without stenosis. A1 segments widely patent. Normal anterior communicating artery complex. Anterior cerebral arteries widely patent to their distal aspects. No M1 stenosis or occlusion. Normal MCA bifurcations. Distal MCA branches well perfused and symmetric. Posterior circulation: Both V4 segments widely patent to the  vertebrobasilar junction. Both PICA origins patent and normal. Basilar widely patent to its distal aspect without stenosis. Superior cerebellar arteries patent bilaterally. Both PCAs primarily supplied via the basilar well perfused to their distal aspects. Venous sinuses: Not well assessed due to timing of the contrast bolus. Anatomic variants: None significant.  No aneurysm. Review of the MIP images confirms the above findings CT Brain Perfusion Findings: CBF (<30%) Volume: 24mL Perfusion (Tmax>6.0s) volume: 49mL Mismatch Volume: 84mL Infarction Location:Negative CT perfusion with no evidence for acute core infarct or other perfusion deficit. IMPRESSION: CTA HEAD AND NECK IMPRESSION: 1. Negative CTA of the head and neck with no evidence for large vessel occlusion. 2. Mild for age atheromatous plaque about the carotid bifurcations. No hemodynamically significant or correctable stenosis about the major arterial vasculature of the head and neck. CT PERFUSION IMPRESSION: Negative CT perfusion for acute ischemia or other perfusion abnormality. Electronically Signed   By: Rise Mu M.D.   On: 02/17/2020 21:18   ECHOCARDIOGRAM COMPLETE  Result Date: 02/18/2020    ECHOCARDIOGRAM REPORT   Patient Name:   CELIA HERMAN Date of Exam: 02/18/2020 Medical Rec #:  [id number]        Height:       62.0 in Accession #:    [id number]       Weight:       128.8 lb Date of Birth:  [birth year]        BSA:          1.586 m Patient Age:    79 years         BP:           150/96 mmHg Patient Gender: F                HR:           72 bpm. Exam Location:  Inpatient Procedure: 2D Echo, Cardiac Doppler and Color Doppler Indications:    Stroke  History:        Patient has no prior history of Echocardiogram examinations.                 Stroke, Signs/Symptoms:Altered Mental Status; Risk                 Factors:Dyslipidemia and Sleep Apnea.  Sonographer:    Roseanna Rainbow RDCS Referring Phys: [phone] JARED M GARDNER  Sonographer Comments:  Technically challenging study due to limited acoustic windows, Technically difficult study due to poor echo windows, suboptimal apical window and suboptimal parasternal window. Extremely difficult study, artifact in apical region. No on axis apical view. IMPRESSIONS  1. Left ventricular ejection fraction, by estimation, is 60 to 65%. The left ventricle has normal function. The left ventricle has no regional wall motion abnormalities. There is moderate asymmetric left ventricular hypertrophy of the septal segment. Left ventricular diastolic parameters are consistent with Grade I diastolic dysfunction (impaired relaxation).  2. Right ventricular systolic function is normal. The right ventricular size is normal. Tricuspid regurgitation signal is inadequate for assessing PA pressure.  3. The mitral valve is normal in structure. No evidence of mitral valve regurgitation. No evidence of mitral stenosis.  4. The aortic valve was not well visualized. Aortic valve regurgitation is not visualized. No aortic stenosis is present.  5. The inferior vena cava is normal in size with greater than 50% respiratory variability, suggesting right atrial pressure of 3 mmHg. FINDINGS  Left Ventricle: Left ventricular ejection fraction, by estimation, is 60 to 65%. The left ventricle has normal function. The left ventricle has no regional wall motion abnormalities. The left ventricular internal cavity size was normal in size. There is  moderate asymmetric left ventricular hypertrophy of the septal segment. Left ventricular diastolic parameters are consistent with Grade I diastolic dysfunction (impaired relaxation). Normal left ventricular filling pressure. Right Ventricle: The right ventricular size is normal. No increase in right ventricular wall thickness. Right ventricular systolic function is normal. Tricuspid regurgitation signal is inadequate for assessing PA pressure. Left Atrium: Left atrial size was not well visualized. Right  Atrium: Right atrial size was not well visualized. Pericardium: There is no evidence of pericardial effusion. Mitral Valve: The mitral valve is normal in structure. No evidence of mitral valve regurgitation. No evidence of mitral valve stenosis. Tricuspid Valve: The tricuspid valve is normal in structure. Tricuspid valve regurgitation is not demonstrated. No evidence of tricuspid stenosis. Aortic Valve: The aortic valve was not well visualized. Aortic valve regurgitation is not visualized. No aortic stenosis is present. Pulmonic Valve: The pulmonic valve was normal in structure. Pulmonic valve regurgitation is not visualized. No evidence of pulmonic stenosis. Aorta: The aortic root is normal in size and structure. Venous: The inferior vena cava is normal in size with greater than 50% respiratory variability, suggesting right atrial pressure of 3 mmHg. IAS/Shunts: No atrial level shunt detected by color flow Doppler.  LEFT VENTRICLE PLAX 2D LVIDd:         3.20 cm     Diastology LVIDs:         1.70 cm     LV e' medial:    7.40 cm/s LV PW:         1.10 cm     LV E/e' medial:  7.1 LV IVS:        1.30 cm     LV e' lateral:   7.18 cm/s LVOT diam:     1.80 cm     LV E/e' lateral: 7.3 LV SV:         26 LV SV Index:   16 LVOT Area:     2.54 cm  LV Volumes (MOD) LV vol d, MOD A4C: 57.2 ml LV vol s, MOD A4C: 18.1 ml LV SV MOD A4C:     57.2 ml RIGHT VENTRICLE  IVC RV S prime:     9.03 cm/s  IVC diam: 1.30 cm TAPSE (M-mode): 2.0 cm LEFT ATRIUM             Index      RIGHT ATRIUM          Index LA diam:        3.20 cm 2.02 cm/m RA Area:     9.58 cm LA Vol (A2C):   13.1 ml 8.26 ml/m RA Volume:   18.70 ml 11.79 ml/m LA Vol (A4C):   10.7 ml 6.75 ml/m LA Biplane Vol: 12.5 ml 7.88 ml/m  AORTIC VALVE LVOT Vmax:   56.20 cm/s LVOT Vmean:  37.000 cm/s LVOT VTI:    0.102 m  AORTA Ao Root diam: 2.70 cm Ao Asc diam:  3.40 cm MITRAL VALVE MV Area (PHT): 4.31 cm    SHUNTS MV Decel Time: 176 msec    Systemic VTI:  0.10 m MV E  velocity: 52.40 cm/s  Systemic Diam: 1.80 cm MV A velocity: 64.00 cm/s MV E/A ratio:  0.82 Fransico Him MD Electronically signed by Fransico Him MD Signature Date/Time: 02/18/2020/3:27:41 PM    Final        Subjective: Did not sleep well.  Has appetite this morning.  Ahasia improved.  No new focal weakness. No fever.  No confusion  Discharge Exam: Vitals:   02/19/20 0100 02/19/20 0807  BP: 123/80 124/81  Pulse: 80   Resp: 16 18  Temp: 98.3 F (36.8 C)   SpO2: 98% 99%   Vitals:   02/18/20 1520 02/18/20 1955 02/19/20 0100 02/19/20 0807  BP: 128/76 (!) 173/102 123/80 124/81  Pulse: 81 (!) 109 80   Resp: 18 16 16 18   Temp: 98.4 F (36.9 C) 98 F (36.7 C) 98.3 F (36.8 C)   TempSrc: Oral Oral Axillary   SpO2: 97% 92% 98% 99%    General: Pt is alert, awake, not in acute distress, sitting on edge of bed, appears tired Cardiovascular: RRR, nl S1-S2, no murmurs appreciated.   No LE edema.   Respiratory: Normal respiratory rate and rhythm.  CTAB without rales or wheezes. MSK: Normal muscle bulk and tone, no eeffusions of the large joints, no deformities. Neuro/Psych: Attention distracted. Aphasia present but improved from yesterday.  Strength symmetric in upper extremities.  Judgment and insight appear normal.   The results of significant diagnostics from this hospitalization (including imaging, microbiology, ancillary and laboratory) are listed below for reference.     Microbiology: Recent Results (from the past 240 hour(s))  Resp Panel by RT-PCR (Flu A&B, Covid) Nasopharyngeal Swab     Status: None   Collection Time: 02/17/20  5:28 PM   Specimen: Nasopharyngeal Swab; Nasopharyngeal(NP) swabs in vial transport medium  Result Value Ref Range Status   SARS Coronavirus 2 by RT PCR NEGATIVE NEGATIVE Final    Comment: (NOTE) SARS-CoV-2 target nucleic acids are NOT DETECTED.  The SARS-CoV-2 RNA is generally detectable in upper respiratory specimens during the acute phase of  infection. The lowest concentration of SARS-CoV-2 viral copies this assay can detect is 138 copies/mL. A negative result does not preclude SARS-Cov-2 infection and should not be used as the sole basis for treatment or other patient management decisions. A negative result may occur with  improper specimen collection/handling, submission of specimen other than nasopharyngeal swab, presence of viral mutation(s) within the areas targeted by this assay, and inadequate number of viral copies(<138 copies/mL). A negative result must be combined with clinical  observations, patient history, and epidemiological information. The expected result is Negative.  Fact Sheet for Patients:  EntrepreneurPulse.com.au  Fact Sheet for Healthcare Providers:  IncredibleEmployment.be  This test is no t yet approved or cleared by the [place] FDA and  has been authorized for detection and/or diagnosis of SARS-CoV-2 by FDA under an Emergency Use Authorization (EUA). This EUA will remain  in effect (meaning this test can be used) for the duration of the COVID-19 declaration under Section 564(b)(1) of the Act, 21 U.S.C.section 360bbb-3(b)(1), unless the authorization is terminated  or revoked sooner.       Influenza A by PCR NEGATIVE NEGATIVE Final   Influenza B by PCR NEGATIVE NEGATIVE Final    Comment: (NOTE) The Xpert Xpress SARS-CoV-2/FLU/RSV plus assay is intended as an aid in the diagnosis of influenza from Nasopharyngeal swab specimens and should not be used as a sole basis for treatment. Nasal washings and aspirates are unacceptable for Xpert Xpress SARS-CoV-2/FLU/RSV testing.  Fact Sheet for Patients: EntrepreneurPulse.com.au  Fact Sheet for Healthcare Providers: IncredibleEmployment.be  This test is not yet approved or cleared by the [place] FDA and has been authorized for detection and/or diagnosis of SARS-CoV-2  by FDA under an Emergency Use Authorization (EUA). This EUA will remain in effect (meaning this test can be used) for the duration of the COVID-19 declaration under Section 564(b)(1) of the Act, 21 U.S.C. section 360bbb-3(b)(1), unless the authorization is terminated or revoked.  Performed at [facility], [place] [address], [place], [place] [address]      Labs: BNP (last 3 results) No results for input(s): BNP in the last 8760 hours. Basic Metabolic Panel: Recent Labs  Lab 02/17/20 1322 02/17/20 1344  NA 141 143  K 3.9 3.7  CL 106 107  CO2 23  --   GLUCOSE 140* 132*  BUN 16 19  CREATININE 0.78 0.70  CALCIUM 9.4  --    Liver Function Tests: Recent Labs  Lab 02/17/20 1322  AST 31  ALT 31  ALKPHOS 87  BILITOT <0.1*  PROT 7.8  ALBUMIN 4.5   No results for input(s): LIPASE, AMYLASE in the last 168 hours. No results for input(s): AMMONIA in the last 168 hours. CBC: Recent Labs  Lab 02/17/20 1322 02/17/20 1344  WBC 6.0  --   NEUTROABS 4.5  --   HGB 14.7 16.7*  HCT 47.7* 49.0*  MCV 87.7  --   PLT 255  --    Cardiac Enzymes: No results for input(s): CKTOTAL, CKMB, CKMBINDEX, TROPONINI in the last 168 hours. BNP: Invalid input(s): POCBNP CBG: Recent Labs  Lab 02/17/20 1656  GLUCAP 102*   D-Dimer No results for input(s): DDIMER in the last 72 hours. Hgb A1c Recent Labs    02/18/20 0541  HGBA1C 5.7*   Lipid Profile Recent Labs    02/18/20 0541  CHOL 215*  HDL 71  LDLCALC 134*  TRIG 50  CHOLHDL 3.0   Thyroid function studies No results for input(s): TSH, T4TOTAL, T3FREE, THYROIDAB in the last 72 hours.  Invalid input(s): FREET3 Anemia work up No results for input(s): VITAMINB12, FOLATE, FERRITIN, TIBC, IRON, RETICCTPCT in the last 72 hours. Urinalysis    Component Value Date/Time   COLORURINE yellow 09/23/2009 0814   APPEARANCEUR Clear 09/23/2009 0814   LABSPEC 1.020 09/23/2009 0814   PHURINE 6.5 09/23/2009 0814   HGBUR negative  09/23/2009 0814   BILIRUBINUR n 04/11/2017 1231   PROTEINUR n 04/11/2017 1231   UROBILINOGEN 0.2 04/11/2017  1231   UROBILINOGEN 0.2 09/23/2009 0814   NITRITE n 04/11/2017 1231   NITRITE negative 09/23/2009 0814   LEUKOCYTESUR Negative 04/11/2017 1231   Sepsis Labs Invalid input(s): PROCALCITONIN,  WBC,  LACTICIDVEN Microbiology Recent Results (from the past 240 hour(s))  Resp Panel by RT-PCR (Flu A&B, Covid) Nasopharyngeal Swab     Status: None   Collection Time: 02/17/20  5:28 PM   Specimen: Nasopharyngeal Swab; Nasopharyngeal(NP) swabs in vial transport medium  Result Value Ref Range Status   SARS Coronavirus 2 by RT PCR NEGATIVE NEGATIVE Final    Comment: (NOTE) SARS-CoV-2 target nucleic acids are NOT DETECTED.  The SARS-CoV-2 RNA is generally detectable in upper respiratory specimens during the acute phase of infection. The lowest concentration of SARS-CoV-2 viral copies this assay can detect is 138 copies/mL. A negative result does not preclude SARS-Cov-2 infection and should not be used as the sole basis for treatment or other patient management decisions. A negative result may occur with  improper specimen collection/handling, submission of specimen other than nasopharyngeal swab, presence of viral mutation(s) within the areas targeted by this assay, and inadequate number of viral copies(<138 copies/mL). A negative result must be combined with clinical observations, patient history, and epidemiological information. The expected result is Negative.  Fact Sheet for Patients:  EntrepreneurPulse.com.au  Fact Sheet for Healthcare Providers:  IncredibleEmployment.be  This test is no t yet approved or cleared by the [place] FDA and  has been authorized for detection and/or diagnosis of SARS-CoV-2 by FDA under an Emergency Use Authorization (EUA). This EUA will remain  in effect (meaning this test can be used) for the duration of  the COVID-19 declaration under Section 564(b)(1) of the Act, 21 U.S.C.section 360bbb-3(b)(1), unless the authorization is terminated  or revoked sooner.       Influenza A by PCR NEGATIVE NEGATIVE Final   Influenza B by PCR NEGATIVE NEGATIVE Final    Comment: (NOTE) The Xpert Xpress SARS-CoV-2/FLU/RSV plus assay is intended as an aid in the diagnosis of influenza from Nasopharyngeal swab specimens and should not be used as a sole basis for treatment. Nasal washings and aspirates are unacceptable for Xpert Xpress SARS-CoV-2/FLU/RSV testing.  Fact Sheet for Patients: EntrepreneurPulse.com.au  Fact Sheet for Healthcare Providers: IncredibleEmployment.be  This test is not yet approved or cleared by the [place] FDA and has been authorized for detection and/or diagnosis of SARS-CoV-2 by FDA under an Emergency Use Authorization (EUA). This EUA will remain in effect (meaning this test can be used) for the duration of the COVID-19 declaration under Section 564(b)(1) of the Act, 21 U.S.C. section 360bbb-3(b)(1), unless the authorization is terminated or revoked.  Performed at [facility], [place] [address], [place], [place] [address]      Time coordinating discharge: 25 minutes The [place] controlled substances registry was reviewed for this patient      SIGNED:   Edwin Dada, MD  [facility] 02/19/2020, 9:10 AM

## 2020-02-19 NOTE — Plan of Care (Signed)

## 2020-02-19 NOTE — TOC Transition Note (Signed)
Transition of Care [facility]) - CM/SW Discharge Note   Patient Details  Name: Carmen [name] MRN: [id number] Date of Birth: [birth year]  Transition of Care [facility]) CM/SW Contact:  Geralynn Ochs, LCSW Phone Number: 02/19/2020, 11:12 AM   Clinical Narrative:   CSW met with patient and son at bedside to discuss return to [facility]. CSW coordinated with Butch Penny at [facility] to determine most appropriate setting, and they can meet patient's current needs with return to ALF and addition of home care. Son at bedside in agreement with plan, and will provide transport back to [facility].  Nurse to call report to [phone], [place].    Final next level of care: Assisted Living Barriers to Discharge: No Barriers Identified   Patient Goals and CMS Choice Patient states their goals for this hospitalization and ongoing recovery are:: patient unable to participate in goal setting, only oriented to self CMS Medicare.gov Compare Post Acute Care list provided to:: Patient Represenative (must comment) Choice offered to / list presented to : Adult Children  Discharge Placement              Patient chooses bed at: [facility] Patient to be transferred to facility by: Family vehicle Name of family member notified: Son at bedside Patient and family notified of of transfer: 02/19/20  Discharge Plan and Services                          HH Arranged: OT,Speech Therapy,PT          Social Determinants of Health (SDOH) Interventions     Readmission Risk Interventions No flowsheet data found.

## 2020-02-20 ENCOUNTER — Encounter: Payer: Self-pay | Admitting: Internal Medicine

## 2020-02-20 NOTE — Progress Notes (Signed)
This encounter was created in error - please disregard.

## 2020-02-25 ENCOUNTER — Encounter: Payer: Self-pay | Admitting: Internal Medicine

## 2020-02-25 ENCOUNTER — Encounter: Payer: Medicare Other | Admitting: Internal Medicine

## 2020-02-26 NOTE — Telephone Encounter (Signed)
Working remotely, I called patient and scheduled her for tomorrow at 2:30pm. I also called and spoke to daughter "Dennie Maizes" who agreed to make sure she's there on time.

## 2020-02-27 ENCOUNTER — Encounter: Payer: Self-pay | Admitting: Internal Medicine

## 2020-02-27 ENCOUNTER — Non-Acute Institutional Stay (INDEPENDENT_AMBULATORY_CARE_PROVIDER_SITE_OTHER): Payer: Medicare Other | Admitting: Internal Medicine

## 2020-02-27 ENCOUNTER — Other Ambulatory Visit: Payer: Self-pay

## 2020-02-27 VITALS — BP 126/82 | HR 98 | Temp 97.5°F | Ht 62.0 in | Wt 131.0 lb

## 2020-02-27 DIAGNOSIS — I69320 Aphasia following cerebral infarction: Secondary | ICD-10-CM | POA: Diagnosis not present

## 2020-02-27 DIAGNOSIS — F4321 Adjustment disorder with depressed mood: Secondary | ICD-10-CM

## 2020-02-27 DIAGNOSIS — F0280 Dementia in other diseases classified elsewhere without behavioral disturbance: Secondary | ICD-10-CM | POA: Diagnosis not present

## 2020-02-27 DIAGNOSIS — F418 Other specified anxiety disorders: Secondary | ICD-10-CM

## 2020-02-27 DIAGNOSIS — F02B0 Dementia in other diseases classified elsewhere, moderate, without behavioral disturbance, psychotic disturbance, mood disturbance, and anxiety: Secondary | ICD-10-CM

## 2020-02-27 MED ORDER — LORAZEPAM 0.5 MG PO TABS: 0.5000 mg | ORAL_TABLET | Freq: Every day | ORAL | 0 refills | Status: DC

## 2020-02-27 NOTE — Progress Notes (Addendum)
Location:  [facility] clinic Provider: Tayloranne Lekas L. [name], D.O., C.M.D.  Code Status: DNR Goals of Care:  Advanced Directives 03/04/2020  Does Patient Have a Medical Advance Directive? Yes  Type of Paramedic of [place];Out of facility DNR (pink MOST or yellow form)  Does patient want to make changes to medical advance directive? -  Copy of [place] in Chart? -  Would patient like information on creating a medical advance directive? No - Patient declined  Pre-existing out of facility DNR order (yellow form or pink MOST form) Pink MOST/Yellow Form most recent copy in chart - Physician notified to receive inpatient order   Chief Complaint  Patient presents with  . Acute Visit    Frustration and depression  . Transitions Of Care    S/p hospitalization for aphasia    HPI: Patient is a 80 y.o. female seen today for hospital f/u/TOC visit for frustration and depression after stroke.  She was hospitalized 1/9-1/11/22 with an acute ischemic stroke that caused worsening of her already present aphasia.  She has even more difficulty finding words and becomes tearful and upset quickly.  She's working with [facility] here.  Note [place] also with periods of anxiety and tearfulness where she would call family at all hours.  She has done better when she has someone with her most of the time.  Her dementia is progressing and she was already having challenges finding her way around [place] and [place] in general, but this has gotten worse and staff have been concerned about her dignity for some time; however, she'd just come to [place] and family did not want to move her again to memory care so quickly.  Now there are no open beds available at this time.  Her daughter would like for her to get the lorazepam that was already ordered during her admission to help her anxiety and tearful spells.  Past Medical History:  Diagnosis Date  . Anxiety     Per [facility] New Patient Packet   . Bleb x 4   . Depression    Per [facility] New Patient Packet   . Dermatitis due to solar radiation   . Dyshidrosis 05/03/2007  . Fuchs' corneal dystrophy    Per [facility] New Patient Packet   . Glaucoma   . Heart murmur   . Hyperlipidemia    Per [facility] New Patient Packet   . Malignant melanoma ([place])    Per [facility] New Patient Packet   . Osteopenia   . Pernicious anemia    Per [facility] [employer] New Patient Packet   . Tinnitus     Past Surgical History:  Procedure Laterality Date  . CORNEAL TRANSPLANT     Dr.[facility], Per [place] New Patient Packet   . MASTECTOMY    . PLACEMENT OF BREAST IMPLANTS      Allergies  Allergen Reactions  . Sulfonamide Derivatives Other (See Comments)    Doesn't remember    Outpatient Encounter Medications as of 02/27/2020  Medication Sig  . aspirin EC 81 MG tablet Take 81 mg by mouth daily.  . cyanocobalamin (,VITAMIN B-12,) 1000 MCG/ML injection INJECT 83mL MONTHLY  . LUMIGAN 0.01 % SOLN Place 1 drop into both eyes daily.   . pravastatin (PRAVACHOL) 10 MG tablet TAKE ONE TABLET DAILY  . [DISCONTINUED] LORazepam (ATIVAN) 0.5 MG tablet Take 1 tablet (0.5 mg total) by mouth daily as needed for anxiety.  . [  DISCONTINUED] mirtazapine (REMERON) 7.5 MG tablet Take 1 tablet (7.5 mg total) by mouth at bedtime.  Marland Kitchen LORazepam (ATIVAN) 0.5 MG tablet Take 1 tablet (0.5 mg total) by mouth daily.   No facility-administered encounter medications on file as of 02/27/2020.    Review of Systems:  Review of Systems  Constitutional: Negative for chills, fever and malaise/fatigue.  HENT: Negative for congestion and sore throat.   Eyes: Negative for blurred vision.  Respiratory: Negative for cough and shortness of breath.   Cardiovascular: Negative for chest pain, palpitations and leg swelling.  Gastrointestinal: Negative for abdominal pain and diarrhea.  Genitourinary: Negative for dysuria.  Musculoskeletal: Negative for falls.  Skin: Negative for rash.   Neurological: Negative for dizziness, sensory change, loss of consciousness and weakness.  Psychiatric/Behavioral: Positive for depression and memory loss. The patient is nervous/anxious.        Sometimes up at night, but other times sleeps well    Health Maintenance  Topic Date Due  . Hepatitis C Screening  Never done  . COVID-19 Vaccine (4 - Booster for Moderna series) 06/18/2020  . TETANUS/TDAP  10/19/2020  . INFLUENZA VACCINE  Completed  . DEXA SCAN  Completed  . PNA vac Low Risk Adult  Completed    Physical Exam: Vitals:   02/27/20 1439  BP: 126/82  Pulse: 98  Temp: (!) 97.5 F (36.4 C)  SpO2: 97%  Weight: 131 lb (59.4 kg)  Height: 5\' 2"  (1.575 m)   Body mass index is 23.96 kg/m. Physical Exam Vitals reviewed.  Constitutional:      Appearance: Normal appearance.  HENT:     Head: Normocephalic and atraumatic.  Eyes:     Extraocular Movements: Extraocular movements intact.     Conjunctiva/sclera: Conjunctivae normal.     Pupils: Pupils are equal, round, and reactive to light.  Cardiovascular:     Rate and Rhythm: Normal rate and regular rhythm.     Pulses: Normal pulses.     Heart sounds: Normal heart sounds.  Pulmonary:     Effort: Pulmonary effort is normal.     Breath sounds: Normal breath sounds. No wheezing, rhonchi or rales.  Abdominal:     General: Bowel sounds are normal.     Palpations: Abdomen is soft.  Musculoskeletal:        General: Normal range of motion.     Right lower leg: No edema.     Left lower leg: No edema.  Neurological:     General: No focal deficit present.     Mental Status: She is alert.     Cranial Nerves: No cranial nerve deficit.     Sensory: No sensory deficit.     Motor: No weakness.     Coordination: Coordination normal.     Gait: Gait normal.     Deep Tendon Reflexes: Reflexes normal.     Comments: Words are clear when she's able to find them  Psychiatric:     Comments: Near tears much of visit when trying to find  words and cannot and sometimes just randomly     Labs reviewed: Basic Metabolic Panel: Recent Labs    02/17/20 1322 02/17/20 1344  NA 141 143  K 3.9 3.7  CL 106 107  CO2 23  --   GLUCOSE 140* 132*  BUN 16 19  CREATININE 0.78 0.70  CALCIUM 9.4  --    Liver Function Tests: Recent Labs    02/17/20 1322  AST 31  ALT 31  ALKPHOS 37  BILITOT <0.1*  PROT 7.8  ALBUMIN 4.5   No results for input(s): LIPASE, AMYLASE in the last 8760 hours. No results for input(s): AMMONIA in the last 8760 hours. CBC: Recent Labs    02/17/20 1322 02/17/20 1344  WBC 6.0  --   NEUTROABS 4.5  --   HGB 14.7 16.7*  HCT 47.7* 49.0*  MCV 87.7  --   PLT 255  --    Lipid Panel: Recent Labs    02/18/20 0541  CHOL 215*  HDL 71  LDLCALC 134*  TRIG 50  CHOLHDL 3.0   Lab Results  Component Value Date   HGBA1C 5.7 (H) 02/18/2020    Procedures since last visit: CT Angio Head W or Wo Contrast  Result Date: 02/17/2020 CLINICAL DATA:  Initial evaluation for neuro deficit, stroke suspected, expressive aphasia. EXAM: CT ANGIOGRAPHY HEAD AND NECK CT PERFUSION BRAIN TECHNIQUE: Multidetector CT imaging of the head and neck was performed using the standard protocol during bolus administration of intravenous contrast. Multiplanar CT image reconstructions and MIPs were obtained to evaluate the vascular anatomy. Carotid stenosis measurements (when applicable) are obtained utilizing NASCET criteria, using the distal internal carotid diameter as the denominator. Multiphase CT imaging of the brain was performed following IV bolus contrast injection. Subsequent parametric perfusion maps were calculated using RAPID software. CONTRAST:  124mL OMNIPAQUE IOHEXOL 350 MG/ML SOLN COMPARISON:  Prior head CT from earlier the same day. FINDINGS: CTA NECK FINDINGS Aortic arch: Visualized aortic arch of normal caliber with normal branch pattern. No hemodynamically significant stenosis seen about the origin of the great  vessels. Right carotid system: Right CCA patent from its origin to the bifurcation without stenosis. Mild atheromatous change about the right bifurcation without significant stenosis. Right ICA widely patent distally without stenosis, dissection or occlusion. Left carotid system: Left CCA patent from its origin to the bifurcation without stenosis. Mild atheromatous plaque about the left bifurcation without significant stenosis. Left ICA widely patent distally without stenosis, dissection or occlusion. Vertebral arteries: Both vertebral arteries arise from subclavian arteries. Vertebral arteries widely patent without stenosis, dissection or occlusion. Skeleton: No visible acute osseous abnormality. No discrete or worrisome osseous lesions. Other neck: No other acute soft tissue abnormality within the neck. No mass or adenopathy. Upper chest: Visualized upper chest demonstrates no acute finding. Review of the MIP images confirms the above findings CTA HEAD FINDINGS Anterior circulation: Both internal carotid arteries widely patent to the termini without stenosis. A1 segments widely patent. Normal anterior communicating artery complex. Anterior cerebral arteries widely patent to their distal aspects. No M1 stenosis or occlusion. Normal MCA bifurcations. Distal MCA branches well perfused and symmetric. Posterior circulation: Both V4 segments widely patent to the vertebrobasilar junction. Both PICA origins patent and normal. Basilar widely patent to its distal aspect without stenosis. Superior cerebellar arteries patent bilaterally. Both PCAs primarily supplied via the basilar well perfused to their distal aspects. Venous sinuses: Not well assessed due to timing of the contrast bolus. Anatomic variants: None significant.  No aneurysm. Review of the MIP images confirms the above findings CT Brain Perfusion Findings: CBF (<30%) Volume: 39mL Perfusion (Tmax>6.0s) volume: 103mL Mismatch Volume: 51mL Infarction Location:Negative  CT perfusion with no evidence for acute core infarct or other perfusion deficit. IMPRESSION: CTA HEAD AND NECK IMPRESSION: 1. Negative CTA of the head and neck with no evidence for large vessel occlusion. 2. Mild for age atheromatous plaque about the carotid bifurcations. No hemodynamically significant or correctable stenosis about the major  arterial vasculature of the head and neck. CT PERFUSION IMPRESSION: Negative CT perfusion for acute ischemia or other perfusion abnormality. Electronically Signed   By: Jeannine Boga M.D.   On: 02/17/2020 21:18   DG Chest 2 View  Result Date: 02/17/2020 CLINICAL DATA:  80 year old female with acute ischemic stroke. EXAM: CHEST - 2 VIEW COMPARISON:  None. FINDINGS: Minimal bibasilar atelectasis. No focal consolidation, pleural effusion or pneumothorax. The cardiac silhouette is within limits. No acute osseous pathology. IMPRESSION: No active cardiopulmonary disease. Electronically Signed   By: Anner Crete M.D.   On: 02/17/2020 21:32   CT HEAD WO CONTRAST  Result Date: 02/17/2020 CLINICAL DATA:  Expressive aphasia. EXAM: CT HEAD WITHOUT CONTRAST TECHNIQUE: Contiguous axial images were obtained from the base of the skull through the vertex without intravenous contrast. COMPARISON:  MRI brain dated March 18, 2017. FINDINGS: Brain: Punctate focus of hyperdensity in the right cerebellum (series 5, image 48; series 6, image 24). No evidence of acute infarction, hydrocephalus, extra-axial collection or mass lesion/mass effect. Stable atrophy and severe chronic microvascular ischemic changes. Vascular: Atherosclerotic vascular calcification of the carotid siphons. No hyperdense vessel. Skull: Normal. Negative for fracture or focal lesion. Sinuses/Orbits: No acute finding. Other: None. IMPRESSION: 1. Punctate focus of hyperdensity in the right cerebellum, likely microhemorrhage related to patient's underlying severe amyloid angiopathy as demonstrated on prior MRI. 2.  Stable atrophy and severe chronic microvascular ischemic changes. Electronically Signed   By: Titus Dubin M.D.   On: 02/17/2020 14:21   CT Angio Neck W and/or Wo Contrast  Result Date: 02/17/2020 CLINICAL DATA:  Initial evaluation for neuro deficit, stroke suspected, expressive aphasia. EXAM: CT ANGIOGRAPHY HEAD AND NECK CT PERFUSION BRAIN TECHNIQUE: Multidetector CT imaging of the head and neck was performed using the standard protocol during bolus administration of intravenous contrast. Multiplanar CT image reconstructions and MIPs were obtained to evaluate the vascular anatomy. Carotid stenosis measurements (when applicable) are obtained utilizing NASCET criteria, using the distal internal carotid diameter as the denominator. Multiphase CT imaging of the brain was performed following IV bolus contrast injection. Subsequent parametric perfusion maps were calculated using RAPID software. CONTRAST:  13mL OMNIPAQUE IOHEXOL 350 MG/ML SOLN COMPARISON:  Prior head CT from earlier the same day. FINDINGS: CTA NECK FINDINGS Aortic arch: Visualized aortic arch of normal caliber with normal branch pattern. No hemodynamically significant stenosis seen about the origin of the great vessels. Right carotid system: Right CCA patent from its origin to the bifurcation without stenosis. Mild atheromatous change about the right bifurcation without significant stenosis. Right ICA widely patent distally without stenosis, dissection or occlusion. Left carotid system: Left CCA patent from its origin to the bifurcation without stenosis. Mild atheromatous plaque about the left bifurcation without significant stenosis. Left ICA widely patent distally without stenosis, dissection or occlusion. Vertebral arteries: Both vertebral arteries arise from subclavian arteries. Vertebral arteries widely patent without stenosis, dissection or occlusion. Skeleton: No visible acute osseous abnormality. No discrete or worrisome osseous lesions.  Other neck: No other acute soft tissue abnormality within the neck. No mass or adenopathy. Upper chest: Visualized upper chest demonstrates no acute finding. Review of the MIP images confirms the above findings CTA HEAD FINDINGS Anterior circulation: Both internal carotid arteries widely patent to the termini without stenosis. A1 segments widely patent. Normal anterior communicating artery complex. Anterior cerebral arteries widely patent to their distal aspects. No M1 stenosis or occlusion. Normal MCA bifurcations. Distal MCA branches well perfused and symmetric. Posterior circulation: Both V4 segments widely  patent to the vertebrobasilar junction. Both PICA origins patent and normal. Basilar widely patent to its distal aspect without stenosis. Superior cerebellar arteries patent bilaterally. Both PCAs primarily supplied via the basilar well perfused to their distal aspects. Venous sinuses: Not well assessed due to timing of the contrast bolus. Anatomic variants: None significant.  No aneurysm. Review of the MIP images confirms the above findings CT Brain Perfusion Findings: CBF (<30%) Volume: 76mL Perfusion (Tmax>6.0s) volume: 4mL Mismatch Volume: 42mL Infarction Location:Negative CT perfusion with no evidence for acute core infarct or other perfusion deficit. IMPRESSION: CTA HEAD AND NECK IMPRESSION: 1. Negative CTA of the head and neck with no evidence for large vessel occlusion. 2. Mild for age atheromatous plaque about the carotid bifurcations. No hemodynamically significant or correctable stenosis about the major arterial vasculature of the head and neck. CT PERFUSION IMPRESSION: Negative CT perfusion for acute ischemia or other perfusion abnormality. Electronically Signed   By: Rise Mu M.D.   On: 02/17/2020 21:18   MR BRAIN WO CONTRAST  Result Date: 02/17/2020 CLINICAL DATA:  Initial evaluation for acute neuro deficit, stroke suspected. EXAM: MRI HEAD WITHOUT CONTRAST TECHNIQUE: Multiplanar,  multiecho pulse sequences of the brain and surrounding structures were obtained without intravenous contrast. COMPARISON:  Prior CT and CT perfusion from earlier the same day. Comparison also made with previous MRI from 03/18/2017. FINDINGS: Brain: Diffuse prominence of the CSF containing spaces compatible with generalized age-related cerebral atrophy. Patchy T2/FLAIR hyperintensity within the periventricular and deep white matter both cerebral hemispheres consistent with chronic microvascular ischemic disease, moderately advanced in nature. 1.7 cm acute ischemic nonhemorrhagic infarcts seen involving the left frontal corona radiata. No associated hemorrhage or mass effect. No other evidence for acute or subacute ischemia. Gray-white matter differentiation otherwise maintained. No acute intracranial hemorrhage. Again seen are innumerable chronic micro hemorrhages scattered throughout both cerebral and cerebellar hemispheres, predominantly peripheral in location, and most consistent with severe cerebral amyloid angiopathy. Superimposed areas of encephalomalacia with chronic hemosiderin staining at the posterior right frontal, right parietal, and left occipital lobes likely reflect remote hemorrhages, likely related to underlying angiopathy. No mass lesion, midline shift or mass effect. Mild diffuse ventricular prominence related to global parenchymal volume loss without hydrocephalus. No extra-axial fluid collection. Pituitary gland and suprasellar region within normal limits. Midline structures intact. Small pericallosal lipoma noted. Vascular: Major intracranial vascular flow voids are maintained. Skull and upper cervical spine: Craniocervical junction normal. Bone marrow signal intensity within normal limits. No scalp soft tissue abnormality. Sinuses/Orbits: Patient status post bilateral ocular lens replacement. Globes and orbital soft tissues demonstrate no acute finding. Paranasal sinuses are clear. Trace  bilateral mastoid effusions, of doubtful significance. Other: None. IMPRESSION: 1. 1.7 cm acute ischemic nonhemorrhagic infarct involving the left frontal corona radiata. 2. Innumerable chronic micro hemorrhages scattered throughout both cerebral and cerebellar hemispheres, most consistent with severe cerebral amyloid angiopathy. 3. Underlying age-related cerebral atrophy with moderately advanced chronic microvascular ischemic disease. Electronically Signed   By: Rise Mu M.D.   On: 02/17/2020 21:25   CT CEREBRAL PERFUSION W CONTRAST  Result Date: 02/17/2020 CLINICAL DATA:  Initial evaluation for neuro deficit, stroke suspected, expressive aphasia. EXAM: CT ANGIOGRAPHY HEAD AND NECK CT PERFUSION BRAIN TECHNIQUE: Multidetector CT imaging of the head and neck was performed using the standard protocol during bolus administration of intravenous contrast. Multiplanar CT image reconstructions and MIPs were obtained to evaluate the vascular anatomy. Carotid stenosis measurements (when applicable) are obtained utilizing NASCET criteria, using the distal internal carotid diameter  as the denominator. Multiphase CT imaging of the brain was performed following IV bolus contrast injection. Subsequent parametric perfusion maps were calculated using RAPID software. CONTRAST:  119mL OMNIPAQUE IOHEXOL 350 MG/ML SOLN COMPARISON:  Prior head CT from earlier the same day. FINDINGS: CTA NECK FINDINGS Aortic arch: Visualized aortic arch of normal caliber with normal branch pattern. No hemodynamically significant stenosis seen about the origin of the great vessels. Right carotid system: Right CCA patent from its origin to the bifurcation without stenosis. Mild atheromatous change about the right bifurcation without significant stenosis. Right ICA widely patent distally without stenosis, dissection or occlusion. Left carotid system: Left CCA patent from its origin to the bifurcation without stenosis. Mild atheromatous plaque  about the left bifurcation without significant stenosis. Left ICA widely patent distally without stenosis, dissection or occlusion. Vertebral arteries: Both vertebral arteries arise from subclavian arteries. Vertebral arteries widely patent without stenosis, dissection or occlusion. Skeleton: No visible acute osseous abnormality. No discrete or worrisome osseous lesions. Other neck: No other acute soft tissue abnormality within the neck. No mass or adenopathy. Upper chest: Visualized upper chest demonstrates no acute finding. Review of the MIP images confirms the above findings CTA HEAD FINDINGS Anterior circulation: Both internal carotid arteries widely patent to the termini without stenosis. A1 segments widely patent. Normal anterior communicating artery complex. Anterior cerebral arteries widely patent to their distal aspects. No M1 stenosis or occlusion. Normal MCA bifurcations. Distal MCA branches well perfused and symmetric. Posterior circulation: Both V4 segments widely patent to the vertebrobasilar junction. Both PICA origins patent and normal. Basilar widely patent to its distal aspect without stenosis. Superior cerebellar arteries patent bilaterally. Both PCAs primarily supplied via the basilar well perfused to their distal aspects. Venous sinuses: Not well assessed due to timing of the contrast bolus. Anatomic variants: None significant.  No aneurysm. Review of the MIP images confirms the above findings CT Brain Perfusion Findings: CBF (<30%) Volume: 63mL Perfusion (Tmax>6.0s) volume: 82mL Mismatch Volume: 33mL Infarction Location:Negative CT perfusion with no evidence for acute core infarct or other perfusion deficit. IMPRESSION: CTA HEAD AND NECK IMPRESSION: 1. Negative CTA of the head and neck with no evidence for large vessel occlusion. 2. Mild for age atheromatous plaque about the carotid bifurcations. No hemodynamically significant or correctable stenosis about the major arterial vasculature of the  head and neck. CT PERFUSION IMPRESSION: Negative CT perfusion for acute ischemia or other perfusion abnormality. Electronically Signed   By: Jeannine Boga M.D.   On: 02/17/2020 21:18   ECHOCARDIOGRAM COMPLETE  Result Date: 02/18/2020    ECHOCARDIOGRAM REPORT   Patient Name:   TAEISHA KIMREY Date of Exam: 02/18/2020 Medical Rec #:  [id number]        Height:       62.0 in Accession #:    [id number]       Weight:       128.8 lb Date of Birth:  Jun 17, 1940        BSA:          1.586 m Patient Age:    75 years         BP:           150/96 mmHg Patient Gender: F                HR:           72 bpm. Exam Location:  Inpatient Procedure: 2D Echo, Cardiac Doppler and Color Doppler Indications:    Stroke  History:  Patient has no prior history of Echocardiogram examinations.                 Stroke, Signs/Symptoms:Altered Mental Status; Risk                 Factors:Dyslipidemia and Sleep Apnea.  Sonographer:    Roseanna Rainbow RDCS Referring Phys: [phone] JARED M GARDNER  Sonographer Comments: Technically challenging study due to limited acoustic windows, Technically difficult study due to poor echo windows, suboptimal apical window and suboptimal parasternal window. Extremely difficult study, artifact in apical region. No on axis apical view. IMPRESSIONS  1. Left ventricular ejection fraction, by estimation, is 60 to 65%. The left ventricle has normal function. The left ventricle has no regional wall motion abnormalities. There is moderate asymmetric left ventricular hypertrophy of the septal segment. Left ventricular diastolic parameters are consistent with Grade I diastolic dysfunction (impaired relaxation).  2. Right ventricular systolic function is normal. The right ventricular size is normal. Tricuspid regurgitation signal is inadequate for assessing PA pressure.  3. The mitral valve is normal in structure. No evidence of mitral valve regurgitation. No evidence of mitral stenosis.  4. The aortic valve was not well  visualized. Aortic valve regurgitation is not visualized. No aortic stenosis is present.  5. The inferior vena cava is normal in size with greater than 50% respiratory variability, suggesting right atrial pressure of 3 mmHg. FINDINGS  Left Ventricle: Left ventricular ejection fraction, by estimation, is 60 to 65%. The left ventricle has normal function. The left ventricle has no regional wall motion abnormalities. The left ventricular internal cavity size was normal in size. There is  moderate asymmetric left ventricular hypertrophy of the septal segment. Left ventricular diastolic parameters are consistent with Grade I diastolic dysfunction (impaired relaxation). Normal left ventricular filling pressure. Right Ventricle: The right ventricular size is normal. No increase in right ventricular wall thickness. Right ventricular systolic function is normal. Tricuspid regurgitation signal is inadequate for assessing PA pressure. Left Atrium: Left atrial size was not well visualized. Right Atrium: Right atrial size was not well visualized. Pericardium: There is no evidence of pericardial effusion. Mitral Valve: The mitral valve is normal in structure. No evidence of mitral valve regurgitation. No evidence of mitral valve stenosis. Tricuspid Valve: The tricuspid valve is normal in structure. Tricuspid valve regurgitation is not demonstrated. No evidence of tricuspid stenosis. Aortic Valve: The aortic valve was not well visualized. Aortic valve regurgitation is not visualized. No aortic stenosis is present. Pulmonic Valve: The pulmonic valve was normal in structure. Pulmonic valve regurgitation is not visualized. No evidence of pulmonic stenosis. Aorta: The aortic root is normal in size and structure. Venous: The inferior vena cava is normal in size with greater than 50% respiratory variability, suggesting right atrial pressure of 3 mmHg. IAS/Shunts: No atrial level shunt detected by color flow Doppler.  LEFT VENTRICLE PLAX  2D LVIDd:         3.20 cm     Diastology LVIDs:         1.70 cm     LV e' medial:    7.40 cm/s LV PW:         1.10 cm     LV E/e' medial:  7.1 LV IVS:        1.30 cm     LV e' lateral:   7.18 cm/s LVOT diam:     1.80 cm     LV E/e' lateral: 7.3 LV SV:  26 LV SV Index:   16 LVOT Area:     2.54 cm  LV Volumes (MOD) LV vol d, MOD A4C: 57.2 ml LV vol s, MOD A4C: 18.1 ml LV SV MOD A4C:     57.2 ml RIGHT VENTRICLE            IVC RV S prime:     9.03 cm/s  IVC diam: 1.30 cm TAPSE (M-mode): 2.0 cm LEFT ATRIUM             Index      RIGHT ATRIUM          Index LA diam:        3.20 cm 2.02 cm/m RA Area:     9.58 cm LA Vol (A2C):   13.1 ml 8.26 ml/m RA Volume:   18.70 ml 11.79 ml/m LA Vol (A4C):   10.7 ml 6.75 ml/m LA Biplane Vol: 12.5 ml 7.88 ml/m  AORTIC VALVE LVOT Vmax:   56.20 cm/s LVOT Vmean:  37.000 cm/s LVOT VTI:    0.102 m  AORTA Ao Root diam: 2.70 cm Ao Asc diam:  3.40 cm MITRAL VALVE MV Area (PHT): 4.31 cm    SHUNTS MV Decel Time: 176 msec    Systemic VTI:  0.10 m MV E velocity: 52.40 cm/s  Systemic Diam: 1.80 cm MV A velocity: 64.00 cm/s MV E/A ratio:  0.82 Fransico Him MD Electronically signed by Fransico Him MD Signature Date/Time: 02/18/2020/3:27:41 PM    Final     Assessment/Plan 1. Situational anxiety - will ask nursing to [place] and see how she does -there were concerns when she returned about extreme drowsiness but seems that she still had haldol in her system from the hospital - LORazepam (ATIVAN) 0.5 MG tablet; Take 1 tablet (0.5 mg total) by mouth daily.  Dispense: 30 tablet; Refill: 0  2. Aphasia due to recent stroke -newly worse -cont ST -seems to provoke anxiety and tearfulness at least sometimes  3. Situational depression -cont remeron--may need adjustment  4. Major neurocognitive disorder, due to another medical condition, without behavioral disturbance, moderate ([place]) -has had rapid progression of her dementia since she's been here and now would be suited  best in memory care (for the case for a while)  Labs/tests ordered:  No new added Next appt:  05/21/2020  Auria Mckinlay L. Ronnika Collett, D.O. [facility] [address], [place] [address] Cell Phone (Mon-Fri 8am-5pm):  [phone] On Call:  [phone] & follow prompts after 5pm & weekends Office Phone:  [phone] Office Fax:  [phone]

## 2020-03-03 ENCOUNTER — Encounter: Payer: Self-pay | Admitting: Internal Medicine

## 2020-03-04 ENCOUNTER — Encounter: Payer: Self-pay | Admitting: Internal Medicine

## 2020-03-04 ENCOUNTER — Non-Acute Institutional Stay: Payer: Medicare Other | Admitting: Internal Medicine

## 2020-03-04 DIAGNOSIS — F0280 Dementia in other diseases classified elsewhere without behavioral disturbance: Secondary | ICD-10-CM | POA: Diagnosis not present

## 2020-03-04 DIAGNOSIS — F410 Panic disorder [episodic paroxysmal anxiety] without agoraphobia: Secondary | ICD-10-CM | POA: Diagnosis not present

## 2020-03-04 DIAGNOSIS — F4321 Adjustment disorder with depressed mood: Secondary | ICD-10-CM | POA: Diagnosis not present

## 2020-03-04 DIAGNOSIS — F02B0 Dementia in other diseases classified elsewhere, moderate, without behavioral disturbance, psychotic disturbance, mood disturbance, and anxiety: Secondary | ICD-10-CM

## 2020-03-04 MED ORDER — MIRTAZAPINE 15 MG PO TABS: 15.0000 mg | ORAL_TABLET | Freq: Every day | ORAL | 5 refills | Status: DC

## 2020-03-04 NOTE — Progress Notes (Signed)
Location:  [facility] Room Number: [id number] Place of Service:  SNF (31) Provider:  Chon Buhl L. [name], D.O., C.M.D.  [name], DO  Patient Care Team: [name], DO as PCP - General (Geriatric Medicine) Martinique, Amy, MD as Consulting Physician (Dermatology) Luberta Mutter, MD as Consulting Physician (Ophthalmology) Star Age, MD as Attending Physician (Neurology)  Extended Emergency Contact Information Primary Emergency Contact: [name] [place] of [place] Phone: [phone] Mobile Phone: [phone] Relation: Daughter Secondary Emergency Contact: Lailaa, [place] Mobile Phone: [phone] Relation: Son  Code Status:  DNR Goals of care: Advanced Directive information Advanced Directives 03/04/2020  Does Patient Have a Medical Advance Directive? Yes  Type of Paramedic of [place];Out of facility DNR (pink MOST or yellow form)  Does patient want to make changes to medical advance directive? -  Copy of [place] in Chart? -  Would patient like information on creating a medical advance directive? No - Patient declined  Pre-existing out of facility DNR order (yellow form or pink MOST form) Pink MOST/Yellow Form most recent copy in chart - Physician notified to receive inpatient order     Chief Complaint  Patient presents with  . Acute Visit    Persistent tearfulness post stroke.     HPI:  Pt is a 80 y.o. female seen today for an acute visit for ongoing difficulty with severe tearful spells since her stroke.  She'd been doing better with mood until her stroke with worsening of her aphasia.  She is now crying when she has a period of not finding the right words, but also at times inappropriately at random per staff.  I'd also gotten a note about this from her daughter, [name].  Past Medical History:  Diagnosis Date  . Anxiety    Per [facility] New Patient Packet   . Bleb x 4   .  Depression    Per [facility] New Patient Packet   . Dermatitis due to solar radiation   . Dyshidrosis 05/03/2007  . Fuchs' corneal dystrophy    Per [facility] New Patient Packet   . Glaucoma   . Heart murmur   . Hyperlipidemia    Per [facility] New Patient Packet   . Malignant melanoma ([place])    Per [facility] New Patient Packet   . Osteopenia   . Pernicious anemia    Per [place] New Patient Packet   . Tinnitus    Past Surgical History:  Procedure Laterality Date  . CORNEAL TRANSPLANT     Dr.[facility], Per [place] New Patient Packet   . MASTECTOMY    . PLACEMENT OF BREAST IMPLANTS      Allergies  Allergen Reactions  . Sulfonamide Derivatives Other (See Comments)    Doesn't remember    Outpatient Encounter Medications as of 03/04/2020  Medication Sig  . aspirin EC 81 MG tablet Take 81 mg by mouth daily.  . cyanocobalamin (,VITAMIN B-12,) 1000 MCG/ML injection INJECT 62mL MONTHLY  . LORazepam (ATIVAN) 0.5 MG tablet Take 1 tablet (0.5 mg total) by mouth daily.  Marland Kitchen LUMIGAN 0.01 % SOLN Place 1 drop into both eyes daily.   . pravastatin (PRAVACHOL) 10 MG tablet TAKE ONE TABLET DAILY  . [DISCONTINUED] mirtazapine (REMERON) 7.5 MG tablet Take 1 tablet (7.5 mg total) by mouth at bedtime.  . mirtazapine (REMERON) 15 MG tablet Take 1 tablet (15 mg total) by mouth at bedtime.   No facility-administered encounter medications on file as of 03/04/2020.  Review of Systems  Constitutional: Negative for chills and fever.  HENT: Negative for congestion and sore throat.   Respiratory: Negative for shortness of breath.   Cardiovascular: Negative for chest pain, palpitations and leg swelling.  Gastrointestinal: Negative for abdominal pain and constipation.  Genitourinary: Negative for dysuria.  Musculoskeletal: Negative for falls and joint pain.  Neurological: Negative for dizziness and loss of consciousness.  Endo/Heme/Allergies: Bruises/bleeds easily.  Psychiatric/Behavioral: Positive for depression and memory loss.  The patient is nervous/anxious.     Immunization History  Administered Date(s) Administered  . Influenza Split 10/25/2011  . Influenza Whole 02/08/2005, 11/08/2008, 11/15/2009, 10/20/2010  . Influenza, High Dose Seasonal PF 02/12/2014, 12/19/2015, 12/25/2017, 12/06/2018, 11/30/2019  . Influenza-Unspecified 10/10/2014, 12/12/2016, 01/22/2018  . Moderna Sars-Covid-2 Vaccination 02/20/2019, 03/25/2019, 12/20/2019  . Pneumococcal Conjugate-13 03/08/2013  . Pneumococcal Polysaccharide-23 05/03/2007  . Td 02/09/2000  . Tdap 10/20/2010   Pertinent  Health Maintenance Due  Topic Date Due  . INFLUENZA VACCINE  Completed  . DEXA SCAN  Completed  . PNA vac Low Risk Adult  Completed   Fall Risk  02/27/2020 01/09/2020 09/12/2019 03/28/2019 02/28/2019  Falls in the past year? 0 0 0 0 0  Comment - - - - -  Number falls in past yr: 0 0 0 0 0  Injury with Fall? 0 0 0 0 0   Functional Status Survey:    Vitals:   03/04/20 1219  BP: 123/80  Pulse: 85  Temp: 98.1 F (36.7 C)  SpO2: 98%  Weight: 127 lb 12.8 oz (58 kg)  Height: 5\' 2"  (1.575 m)   Body mass index is 23.37 kg/m. Physical Exam Vitals reviewed.  Constitutional:      Appearance: Normal appearance.  HENT:     Head: Normocephalic and atraumatic.  Cardiovascular:     Rate and Rhythm: Normal rate and regular rhythm.  Pulmonary:     Effort: Pulmonary effort is normal.     Breath sounds: Normal breath sounds.  Musculoskeletal:        General: Normal range of motion.     Right lower leg: No edema.     Left lower leg: No edema.  Neurological:     General: No focal deficit present.     Mental Status: She is alert.     Cranial Nerves: No cranial nerve deficit.     Motor: No weakness.     Comments: More difficulty finding words than before, but does have periods of perfect clarity; also as prior to the stroke, has times when what she is speaking about is totally irrelevant and does not make sense to the rest of [place]  Psychiatric:      Comments: Breaks down in tears after she cannot find words to answer my questions     Labs reviewed: Recent Labs    02/17/20 1322 02/17/20 1344  NA 141 143  K 3.9 3.7  CL 106 107  CO2 23  --   GLUCOSE 140* 132*  BUN 16 19  CREATININE 0.78 0.70  CALCIUM 9.4  --    Recent Labs    02/17/20 1322  AST 31  ALT 31  ALKPHOS 87  BILITOT <0.1*  PROT 7.8  ALBUMIN 4.5   Recent Labs    02/17/20 1322 02/17/20 1344  WBC 6.0  --   NEUTROABS 4.5  --   HGB 14.7 16.7*  HCT 47.7* 49.0*  MCV 87.7  --   PLT 255  --    Lab Results  Component Value Date   TSH 1.77 04/11/2017   Lab Results  Component Value Date   HGBA1C 5.7 (H) 02/18/2020   Lab Results  Component Value Date   CHOL 215 (H) 02/18/2020   HDL 71 02/18/2020   LDLCALC 134 (H) 02/18/2020   LDLDIRECT 165.1 03/08/2013   TRIG 50 02/18/2020   CHOLHDL 3.0 02/18/2020    Significant Diagnostic Results in last 30 days:  CT Angio Head W or Wo Contrast  Result Date: 02/17/2020 CLINICAL DATA:  Initial evaluation for neuro deficit, stroke suspected, expressive aphasia. EXAM: CT ANGIOGRAPHY HEAD AND NECK CT PERFUSION BRAIN TECHNIQUE: Multidetector CT imaging of the head and neck was performed using the standard protocol during bolus administration of intravenous contrast. Multiplanar CT image reconstructions and MIPs were obtained to evaluate the vascular anatomy. Carotid stenosis measurements (when applicable) are obtained utilizing NASCET criteria, using the distal internal carotid diameter as the denominator. Multiphase CT imaging of the brain was performed following IV bolus contrast injection. Subsequent parametric perfusion maps were calculated using RAPID software. CONTRAST:  138mL OMNIPAQUE IOHEXOL 350 MG/ML SOLN COMPARISON:  Prior head CT from earlier the same day. FINDINGS: CTA NECK FINDINGS Aortic arch: Visualized aortic arch of normal caliber with normal branch pattern. No hemodynamically significant stenosis seen about the  origin of the great vessels. Right carotid system: Right CCA patent from its origin to the bifurcation without stenosis. Mild atheromatous change about the right bifurcation without significant stenosis. Right ICA widely patent distally without stenosis, dissection or occlusion. Left carotid system: Left CCA patent from its origin to the bifurcation without stenosis. Mild atheromatous plaque about the left bifurcation without significant stenosis. Left ICA widely patent distally without stenosis, dissection or occlusion. Vertebral arteries: Both vertebral arteries arise from subclavian arteries. Vertebral arteries widely patent without stenosis, dissection or occlusion. Skeleton: No visible acute osseous abnormality. No discrete or worrisome osseous lesions. Other neck: No other acute soft tissue abnormality within the neck. No mass or adenopathy. Upper chest: Visualized upper chest demonstrates no acute finding. Review of the MIP images confirms the above findings CTA HEAD FINDINGS Anterior circulation: Both internal carotid arteries widely patent to the termini without stenosis. A1 segments widely patent. Normal anterior communicating artery complex. Anterior cerebral arteries widely patent to their distal aspects. No M1 stenosis or occlusion. Normal MCA bifurcations. Distal MCA branches well perfused and symmetric. Posterior circulation: Both V4 segments widely patent to the vertebrobasilar junction. Both PICA origins patent and normal. Basilar widely patent to its distal aspect without stenosis. Superior cerebellar arteries patent bilaterally. Both PCAs primarily supplied via the basilar well perfused to their distal aspects. Venous sinuses: Not well assessed due to timing of the contrast bolus. Anatomic variants: None significant.  No aneurysm. Review of the MIP images confirms the above findings CT Brain Perfusion Findings: CBF (<30%) Volume: 56mL Perfusion (Tmax>6.0s) volume: 11mL Mismatch Volume: 58mL  Infarction Location:Negative CT perfusion with no evidence for acute core infarct or other perfusion deficit. IMPRESSION: CTA HEAD AND NECK IMPRESSION: 1. Negative CTA of the head and neck with no evidence for large vessel occlusion. 2. Mild for age atheromatous plaque about the carotid bifurcations. No hemodynamically significant or correctable stenosis about the major arterial vasculature of the head and neck. CT PERFUSION IMPRESSION: Negative CT perfusion for acute ischemia or other perfusion abnormality. Electronically Signed   By: Jeannine Boga M.D.   On: 02/17/2020 21:18   DG Chest 2 View  Result Date: 02/17/2020 CLINICAL DATA:  80 year old female with  acute ischemic stroke. EXAM: CHEST - 2 VIEW COMPARISON:  None. FINDINGS: Minimal bibasilar atelectasis. No focal consolidation, pleural effusion or pneumothorax. The cardiac silhouette is within limits. No acute osseous pathology. IMPRESSION: No active cardiopulmonary disease. Electronically Signed   By: Anner Crete M.D.   On: 02/17/2020 21:32   CT HEAD WO CONTRAST  Result Date: 02/17/2020 CLINICAL DATA:  Expressive aphasia. EXAM: CT HEAD WITHOUT CONTRAST TECHNIQUE: Contiguous axial images were obtained from the base of the skull through the vertex without intravenous contrast. COMPARISON:  MRI brain dated March 18, 2017. FINDINGS: Brain: Punctate focus of hyperdensity in the right cerebellum (series 5, image 48; series 6, image 24). No evidence of acute infarction, hydrocephalus, extra-axial collection or mass lesion/mass effect. Stable atrophy and severe chronic microvascular ischemic changes. Vascular: Atherosclerotic vascular calcification of the carotid siphons. No hyperdense vessel. Skull: Normal. Negative for fracture or focal lesion. Sinuses/Orbits: No acute finding. Other: None. IMPRESSION: 1. Punctate focus of hyperdensity in the right cerebellum, likely microhemorrhage related to patient's underlying severe amyloid angiopathy as  demonstrated on prior MRI. 2. Stable atrophy and severe chronic microvascular ischemic changes. Electronically Signed   By: Titus Dubin M.D.   On: 02/17/2020 14:21   CT Angio Neck W and/or Wo Contrast  Result Date: 02/17/2020 CLINICAL DATA:  Initial evaluation for neuro deficit, stroke suspected, expressive aphasia. EXAM: CT ANGIOGRAPHY HEAD AND NECK CT PERFUSION BRAIN TECHNIQUE: Multidetector CT imaging of the head and neck was performed using the standard protocol during bolus administration of intravenous contrast. Multiplanar CT image reconstructions and MIPs were obtained to evaluate the vascular anatomy. Carotid stenosis measurements (when applicable) are obtained utilizing NASCET criteria, using the distal internal carotid diameter as the denominator. Multiphase CT imaging of the brain was performed following IV bolus contrast injection. Subsequent parametric perfusion maps were calculated using RAPID software. CONTRAST:  128mL OMNIPAQUE IOHEXOL 350 MG/ML SOLN COMPARISON:  Prior head CT from earlier the same day. FINDINGS: CTA NECK FINDINGS Aortic arch: Visualized aortic arch of normal caliber with normal branch pattern. No hemodynamically significant stenosis seen about the origin of the great vessels. Right carotid system: Right CCA patent from its origin to the bifurcation without stenosis. Mild atheromatous change about the right bifurcation without significant stenosis. Right ICA widely patent distally without stenosis, dissection or occlusion. Left carotid system: Left CCA patent from its origin to the bifurcation without stenosis. Mild atheromatous plaque about the left bifurcation without significant stenosis. Left ICA widely patent distally without stenosis, dissection or occlusion. Vertebral arteries: Both vertebral arteries arise from subclavian arteries. Vertebral arteries widely patent without stenosis, dissection or occlusion. Skeleton: No visible acute osseous abnormality. No discrete or  worrisome osseous lesions. Other neck: No other acute soft tissue abnormality within the neck. No mass or adenopathy. Upper chest: Visualized upper chest demonstrates no acute finding. Review of the MIP images confirms the above findings CTA HEAD FINDINGS Anterior circulation: Both internal carotid arteries widely patent to the termini without stenosis. A1 segments widely patent. Normal anterior communicating artery complex. Anterior cerebral arteries widely patent to their distal aspects. No M1 stenosis or occlusion. Normal MCA bifurcations. Distal MCA branches well perfused and symmetric. Posterior circulation: Both V4 segments widely patent to the vertebrobasilar junction. Both PICA origins patent and normal. Basilar widely patent to its distal aspect without stenosis. Superior cerebellar arteries patent bilaterally. Both PCAs primarily supplied via the basilar well perfused to their distal aspects. Venous sinuses: Not well assessed due to timing of the contrast bolus.  Anatomic variants: None significant.  No aneurysm. Review of the MIP images confirms the above findings CT Brain Perfusion Findings: CBF (<30%) Volume: 94mL Perfusion (Tmax>6.0s) volume: 72mL Mismatch Volume: 70mL Infarction Location:Negative CT perfusion with no evidence for acute core infarct or other perfusion deficit. IMPRESSION: CTA HEAD AND NECK IMPRESSION: 1. Negative CTA of the head and neck with no evidence for large vessel occlusion. 2. Mild for age atheromatous plaque about the carotid bifurcations. No hemodynamically significant or correctable stenosis about the major arterial vasculature of the head and neck. CT PERFUSION IMPRESSION: Negative CT perfusion for acute ischemia or other perfusion abnormality. Electronically Signed   By: Jeannine Boga M.D.   On: 02/17/2020 21:18   MR BRAIN WO CONTRAST  Result Date: 02/17/2020 CLINICAL DATA:  Initial evaluation for acute neuro deficit, stroke suspected. EXAM: MRI HEAD WITHOUT CONTRAST  TECHNIQUE: Multiplanar, multiecho pulse sequences of the brain and surrounding structures were obtained without intravenous contrast. COMPARISON:  Prior CT and CT perfusion from earlier the same day. Comparison also made with previous MRI from 03/18/2017. FINDINGS: Brain: Diffuse prominence of the CSF containing spaces compatible with generalized age-related cerebral atrophy. Patchy T2/FLAIR hyperintensity within the periventricular and deep white matter both cerebral hemispheres consistent with chronic microvascular ischemic disease, moderately advanced in nature. 1.7 cm acute ischemic nonhemorrhagic infarcts seen involving the left frontal corona radiata. No associated hemorrhage or mass effect. No other evidence for acute or subacute ischemia. Gray-white matter differentiation otherwise maintained. No acute intracranial hemorrhage. Again seen are innumerable chronic micro hemorrhages scattered throughout both cerebral and cerebellar hemispheres, predominantly peripheral in location, and most consistent with severe cerebral amyloid angiopathy. Superimposed areas of encephalomalacia with chronic hemosiderin staining at the posterior right frontal, right parietal, and left occipital lobes likely reflect remote hemorrhages, likely related to underlying angiopathy. No mass lesion, midline shift or mass effect. Mild diffuse ventricular prominence related to global parenchymal volume loss without hydrocephalus. No extra-axial fluid collection. Pituitary gland and suprasellar region within normal limits. Midline structures intact. Small pericallosal lipoma noted. Vascular: Major intracranial vascular flow voids are maintained. Skull and upper cervical spine: Craniocervical junction normal. Bone marrow signal intensity within normal limits. No scalp soft tissue abnormality. Sinuses/Orbits: Patient status post bilateral ocular lens replacement. Globes and orbital soft tissues demonstrate no acute finding. Paranasal sinuses  are clear. Trace bilateral mastoid effusions, of doubtful significance. Other: None. IMPRESSION: 1. 1.7 cm acute ischemic nonhemorrhagic infarct involving the left frontal corona radiata. 2. Innumerable chronic micro hemorrhages scattered throughout both cerebral and cerebellar hemispheres, most consistent with severe cerebral amyloid angiopathy. 3. Underlying age-related cerebral atrophy with moderately advanced chronic microvascular ischemic disease. Electronically Signed   By: Jeannine Boga M.D.   On: 02/17/2020 21:25   CT CEREBRAL PERFUSION W CONTRAST  Result Date: 02/17/2020 CLINICAL DATA:  Initial evaluation for neuro deficit, stroke suspected, expressive aphasia. EXAM: CT ANGIOGRAPHY HEAD AND NECK CT PERFUSION BRAIN TECHNIQUE: Multidetector CT imaging of the head and neck was performed using the standard protocol during bolus administration of intravenous contrast. Multiplanar CT image reconstructions and MIPs were obtained to evaluate the vascular anatomy. Carotid stenosis measurements (when applicable) are obtained utilizing NASCET criteria, using the distal internal carotid diameter as the denominator. Multiphase CT imaging of the brain was performed following IV bolus contrast injection. Subsequent parametric perfusion maps were calculated using RAPID software. CONTRAST:  155mL OMNIPAQUE IOHEXOL 350 MG/ML SOLN COMPARISON:  Prior head CT from earlier the same day. FINDINGS: CTA NECK FINDINGS Aortic arch: Visualized  aortic arch of normal caliber with normal branch pattern. No hemodynamically significant stenosis seen about the origin of the great vessels. Right carotid system: Right CCA patent from its origin to the bifurcation without stenosis. Mild atheromatous change about the right bifurcation without significant stenosis. Right ICA widely patent distally without stenosis, dissection or occlusion. Left carotid system: Left CCA patent from its origin to the bifurcation without stenosis. Mild  atheromatous plaque about the left bifurcation without significant stenosis. Left ICA widely patent distally without stenosis, dissection or occlusion. Vertebral arteries: Both vertebral arteries arise from subclavian arteries. Vertebral arteries widely patent without stenosis, dissection or occlusion. Skeleton: No visible acute osseous abnormality. No discrete or worrisome osseous lesions. Other neck: No other acute soft tissue abnormality within the neck. No mass or adenopathy. Upper chest: Visualized upper chest demonstrates no acute finding. Review of the MIP images confirms the above findings CTA HEAD FINDINGS Anterior circulation: Both internal carotid arteries widely patent to the termini without stenosis. A1 segments widely patent. Normal anterior communicating artery complex. Anterior cerebral arteries widely patent to their distal aspects. No M1 stenosis or occlusion. Normal MCA bifurcations. Distal MCA branches well perfused and symmetric. Posterior circulation: Both V4 segments widely patent to the vertebrobasilar junction. Both PICA origins patent and normal. Basilar widely patent to its distal aspect without stenosis. Superior cerebellar arteries patent bilaterally. Both PCAs primarily supplied via the basilar well perfused to their distal aspects. Venous sinuses: Not well assessed due to timing of the contrast bolus. Anatomic variants: None significant.  No aneurysm. Review of the MIP images confirms the above findings CT Brain Perfusion Findings: CBF (<30%) Volume: 63mL Perfusion (Tmax>6.0s) volume: 74mL Mismatch Volume: 2mL Infarction Location:Negative CT perfusion with no evidence for acute core infarct or other perfusion deficit. IMPRESSION: CTA HEAD AND NECK IMPRESSION: 1. Negative CTA of the head and neck with no evidence for large vessel occlusion. 2. Mild for age atheromatous plaque about the carotid bifurcations. No hemodynamically significant or correctable stenosis about the major arterial  vasculature of the head and neck. CT PERFUSION IMPRESSION: Negative CT perfusion for acute ischemia or other perfusion abnormality. Electronically Signed   By: Jeannine Boga M.D.   On: 02/17/2020 21:18   ECHOCARDIOGRAM COMPLETE  Result Date: 02/18/2020    ECHOCARDIOGRAM REPORT   Patient Name:   Carmen [name] Date of Exam: 02/18/2020 Medical Rec #:  [id number]        Height:       62.0 in Accession #:    [id number]       Weight:       128.8 lb Date of Birth:  07-09-1940        BSA:          1.586 m Patient Age:    80 years         BP:           150/96 mmHg Patient Gender: F                HR:           72 bpm. Exam Location:  Inpatient Procedure: 2D Echo, Cardiac Doppler and Color Doppler Indications:    Stroke  History:        Patient has no prior history of Echocardiogram examinations.                 Stroke, Signs/Symptoms:Altered Mental Status; Risk  Factors:Dyslipidemia and Sleep Apnea.  Sonographer:    Roseanna Rainbow RDCS Referring Phys: [phone] JARED M GARDNER  Sonographer Comments: Technically challenging study due to limited acoustic windows, Technically difficult study due to poor echo windows, suboptimal apical window and suboptimal parasternal window. Extremely difficult study, artifact in apical region. No on axis apical view. IMPRESSIONS  1. Left ventricular ejection fraction, by estimation, is 60 to 65%. The left ventricle has normal function. The left ventricle has no regional wall motion abnormalities. There is moderate asymmetric left ventricular hypertrophy of the septal segment. Left ventricular diastolic parameters are consistent with Grade I diastolic dysfunction (impaired relaxation).  2. Right ventricular systolic function is normal. The right ventricular size is normal. Tricuspid regurgitation signal is inadequate for assessing PA pressure.  3. The mitral valve is normal in structure. No evidence of mitral valve regurgitation. No evidence of mitral stenosis.  4. The aortic  valve was not well visualized. Aortic valve regurgitation is not visualized. No aortic stenosis is present.  5. The inferior vena cava is normal in size with greater than 50% respiratory variability, suggesting right atrial pressure of 3 mmHg. FINDINGS  Left Ventricle: Left ventricular ejection fraction, by estimation, is 60 to 65%. The left ventricle has normal function. The left ventricle has no regional wall motion abnormalities. The left ventricular internal cavity size was normal in size. There is  moderate asymmetric left ventricular hypertrophy of the septal segment. Left ventricular diastolic parameters are consistent with Grade I diastolic dysfunction (impaired relaxation). Normal left ventricular filling pressure. Right Ventricle: The right ventricular size is normal. No increase in right ventricular wall thickness. Right ventricular systolic function is normal. Tricuspid regurgitation signal is inadequate for assessing PA pressure. Left Atrium: Left atrial size was not well visualized. Right Atrium: Right atrial size was not well visualized. Pericardium: There is no evidence of pericardial effusion. Mitral Valve: The mitral valve is normal in structure. No evidence of mitral valve regurgitation. No evidence of mitral valve stenosis. Tricuspid Valve: The tricuspid valve is normal in structure. Tricuspid valve regurgitation is not demonstrated. No evidence of tricuspid stenosis. Aortic Valve: The aortic valve was not well visualized. Aortic valve regurgitation is not visualized. No aortic stenosis is present. Pulmonic Valve: The pulmonic valve was normal in structure. Pulmonic valve regurgitation is not visualized. No evidence of pulmonic stenosis. Aorta: The aortic root is normal in size and structure. Venous: The inferior vena cava is normal in size with greater than 50% respiratory variability, suggesting right atrial pressure of 3 mmHg. IAS/Shunts: No atrial level shunt detected by color flow Doppler.   LEFT VENTRICLE PLAX 2D LVIDd:         3.20 cm     Diastology LVIDs:         1.70 cm     LV e' medial:    7.40 cm/s LV PW:         1.10 cm     LV E/e' medial:  7.1 LV IVS:        1.30 cm     LV e' lateral:   7.18 cm/s LVOT diam:     1.80 cm     LV E/e' lateral: 7.3 LV SV:         26 LV SV Index:   16 LVOT Area:     2.54 cm  LV Volumes (MOD) LV vol d, MOD A4C: 57.2 ml LV vol s, MOD A4C: 18.1 ml LV SV MOD A4C:     57.2 ml  RIGHT VENTRICLE            IVC RV S prime:     9.03 cm/s  IVC diam: 1.30 cm TAPSE (M-mode): 2.0 cm LEFT ATRIUM             Index      RIGHT ATRIUM          Index LA diam:        3.20 cm 2.02 cm/m RA Area:     9.58 cm LA Vol (A2C):   13.1 ml 8.26 ml/m RA Volume:   18.70 ml 11.79 ml/m LA Vol (A4C):   10.7 ml 6.75 ml/m LA Biplane Vol: 12.5 ml 7.88 ml/m  AORTIC VALVE LVOT Vmax:   56.20 cm/s LVOT Vmean:  37.000 cm/s LVOT VTI:    0.102 m  AORTA Ao Root diam: 2.70 cm Ao Asc diam:  3.40 cm MITRAL VALVE MV Area (PHT): 4.31 cm    SHUNTS MV Decel Time: 176 msec    Systemic VTI:  0.10 m MV E velocity: 52.40 cm/s  Systemic Diam: 1.80 cm MV A velocity: 64.00 cm/s MV E/A ratio:  0.82 Fransico Him MD Electronically signed by Fransico Him MD Signature Date/Time: 02/18/2020/3:27:41 PM    Final     Assessment/Plan 1. Situational depression Increase remeron to 15mg  nightly Cont current lorazepam Consider increasing remeron more if no better in a week vs nuedexta attempt for pseudobulbar affect - mirtazapine (REMERON) 15 MG tablet; Take 1 tablet (15 mg total) by mouth at bedtime.  Dispense: 30 tablet; Refill: 5  2. Panic attacks Cont current lorazepam - mirtazapine (REMERON) 15 MG tablet; Take 1 tablet (15 mg total) by mouth at bedtime.  Dispense: 30 tablet; Refill: 5  3. Major neurocognitive disorder, due to another medical condition, without behavioral disturbance, moderate ([place]) Needs [address] move--will have more activities, supervision and less concerns about dignity  Family/ staff  Communication: sent Lasandra Beech message, d/w Delia Heady nurse   Labs/tests ordered:  none  Jaymison Luber L. Akiya Morr, D.O. [facility] [address], [place] [address] Cell Phone (Mon-Fri 8am-5pm):  [phone] On Call:  [phone] & follow prompts after 5pm & weekends Office Phone:  [phone] Office Fax:  [phone]

## 2020-03-12 ENCOUNTER — Encounter: Payer: Self-pay | Admitting: Internal Medicine

## 2020-03-12 ENCOUNTER — Non-Acute Institutional Stay: Payer: Medicare Other | Admitting: Internal Medicine

## 2020-03-12 DIAGNOSIS — F410 Panic disorder [episodic paroxysmal anxiety] without agoraphobia: Secondary | ICD-10-CM

## 2020-03-12 DIAGNOSIS — F02B0 Dementia in other diseases classified elsewhere, moderate, without behavioral disturbance, psychotic disturbance, mood disturbance, and anxiety: Secondary | ICD-10-CM

## 2020-03-12 DIAGNOSIS — F4321 Adjustment disorder with depressed mood: Secondary | ICD-10-CM

## 2020-03-12 DIAGNOSIS — F0280 Dementia in other diseases classified elsewhere without behavioral disturbance: Secondary | ICD-10-CM | POA: Diagnosis not present

## 2020-03-12 DIAGNOSIS — I69320 Aphasia following cerebral infarction: Secondary | ICD-10-CM

## 2020-03-12 NOTE — Progress Notes (Signed)
Location:  Occupational psychologist of Service:  ALF (13) Provider:  Kimyetta Flott L. [name], D.O., C.M.D.  [name], DO  Patient Care Team: [name], DO as PCP - General (Geriatric Medicine) Martinique, Amy, MD as Consulting Physician (Dermatology) Luberta Mutter, MD as Consulting Physician (Ophthalmology) Star Age, MD as Attending Physician (Neurology)  Extended Emergency Contact Information Primary Emergency Contact: [name] [place] of [place] Phone: [phone] Mobile Phone: [phone] Relation: Daughter Secondary Emergency Contact: Julieta, [place] Mobile Phone: [phone] Relation: Son  Code Status:  DNR Goals of care: Advanced Directive information Advanced Directives 03/12/2020  Does Patient Have a Medical Advance Directive? Yes  Type of Paramedic of [place];Out of facility DNR (pink MOST or yellow form)  Does patient want to make changes to medical advance directive? -  Copy of [place] in Chart? Yes - validated most recent copy scanned in chart (See row information)  Would patient like information on creating a medical advance directive? No - Patient declined  Pre-existing out of facility DNR order (yellow form or pink MOST form) Pink MOST/Yellow Form most recent copy in chart - Physician notified to receive inpatient order     Chief Complaint  Patient presents with  . Acute Visit    Crying actually worse with with Remeron    HPI:  Pt is a 80 y.o. female seen today for an acute visit for crying might be worse after remeron dose increased.  She continues to get periods of frustration with her speech that seem to lead into crying spells where she's challenging to console.  Of note, today, she has a caregiver with her who notes that Friday and today, she's been pretty calm, but she's had a very busy day with little time to think.  During our visit, she did not cry and was quite  calm.  Her speech was more fluent, as well, though, due to her dementia, did not always make sense.  See also messages from her daughter in the chart.   Past Medical History:  Diagnosis Date  . Anxiety    Per [facility] New Patient Packet   . Bleb x 4   . Depression    Per [facility] New Patient Packet   . Dermatitis due to solar radiation   . Dyshidrosis 05/03/2007  . Fuchs' corneal dystrophy    Per [facility] New Patient Packet   . Glaucoma   . Heart murmur   . Hyperlipidemia    Per [facility] New Patient Packet   . Malignant melanoma ([place])    Per [facility] New Patient Packet   . Osteopenia   . Pernicious anemia    Per [facility] New Patient Packet   . Tinnitus    Past Surgical History:  Procedure Laterality Date  . CORNEAL TRANSPLANT     Dr.[facility], Per [facility] New Patient Packet   . MASTECTOMY    . PLACEMENT OF BREAST IMPLANTS      Allergies  Allergen Reactions  . Sulfonamide Derivatives Other (See Comments)    Doesn't remember    Outpatient Encounter Medications as of 03/12/2020  Medication Sig  . aspirin EC 81 MG tablet Take 81 mg by mouth daily.  . cyanocobalamin (,VITAMIN B-12,) 1000 MCG/ML injection INJECT 23mL MONTHLY  . LORazepam (ATIVAN) 0.5 MG tablet Take 1 tablet (0.5 mg total) by mouth daily.  Marland Kitchen LUMIGAN 0.01 % SOLN Place 1 drop into both eyes daily.   . mirtazapine (REMERON) 15  MG tablet Take 1 tablet (15 mg total) by mouth at bedtime.  . pravastatin (PRAVACHOL) 10 MG tablet TAKE ONE TABLET DAILY   No facility-administered encounter medications on file as of 03/12/2020.    Review of Systems  Constitutional: Negative for chills, fever and malaise/fatigue.  HENT: Negative for congestion and sore throat.   Eyes: Negative for blurred vision.  Respiratory: Negative for cough and shortness of breath.   Cardiovascular: Negative for chest pain and leg swelling.  Gastrointestinal: Negative for abdominal pain.  Genitourinary: Negative for dysuria.  Musculoskeletal: Negative for falls and joint  pain.  Skin: Negative for rash.  Neurological: Negative for dizziness and loss of consciousness.  Psychiatric/Behavioral: Positive for depression and memory loss. The patient is nervous/anxious.     Immunization History  Administered Date(s) Administered  . Influenza Split 10/25/2011  . Influenza Whole 02/08/2005, 11/08/2008, 11/15/2009, 10/20/2010  . Influenza, High Dose Seasonal PF 02/12/2014, 12/19/2015, 12/25/2017, 12/06/2018, 11/30/2019  . Influenza-Unspecified 10/10/2014, 12/12/2016, 01/22/2018  . Moderna Sars-Covid-2 Vaccination 02/20/2019, 03/25/2019, 12/20/2019  . Pneumococcal Conjugate-13 03/08/2013  . Pneumococcal Polysaccharide-23 05/03/2007  . Td 02/09/2000  . Tdap 10/20/2010   Pertinent  Health Maintenance Due  Topic Date Due  . INFLUENZA VACCINE  Completed  . DEXA SCAN  Completed  . PNA vac Low Risk Adult  Completed   Fall Risk  02/27/2020 01/09/2020 09/12/2019 03/28/2019 02/28/2019  Falls in the past year? 0 0 0 0 0  Comment - - - - -  Number falls in past yr: 0 0 0 0 0  Injury with Fall? 0 0 0 0 0   Functional Status Survey:    Vitals:   03/12/20 1453  BP: 120/66  Pulse: 90  Temp: 97.6 F (36.4 C)  SpO2: 98%  Weight: 131 lb 12.8 oz (59.8 kg)  Height: 5\' 2"  (1.575 m)   Body mass index is 24.11 kg/m. Physical Exam Vitals reviewed.  Constitutional:      Appearance: Normal appearance.  HENT:     Head: Normocephalic and atraumatic.  Cardiovascular:     Rate and Rhythm: Normal rate and regular rhythm.     Pulses: Normal pulses.     Heart sounds: Normal heart sounds.  Pulmonary:     Effort: Pulmonary effort is normal.     Breath sounds: Normal breath sounds.  Neurological:     Mental Status: She is alert.     Comments: Speech more fluent today  Psychiatric:     Comments: Not crying during our visit, had some frustration with aphasia, but less so      Labs reviewed: Recent Labs    02/17/20 1322 02/17/20 1344  NA 141 143  K 3.9 3.7  CL 106  107  CO2 23  --   GLUCOSE 140* 132*  BUN 16 19  CREATININE 0.78 0.70  CALCIUM 9.4  --    Recent Labs    02/17/20 1322  AST 31  ALT 31  ALKPHOS 87  BILITOT <0.1*  PROT 7.8  ALBUMIN 4.5   Recent Labs    02/17/20 1322 02/17/20 1344  WBC 6.0  --   NEUTROABS 4.5  --   HGB 14.7 16.7*  HCT 47.7* 49.0*  MCV 87.7  --   PLT 255  --    Lab Results  Component Value Date   TSH 1.77 04/11/2017   Lab Results  Component Value Date   HGBA1C 5.7 (H) 02/18/2020   Lab Results  Component Value Date   CHOL  215 (H) 02/18/2020   HDL 71 02/18/2020   LDLCALC 134 (H) 02/18/2020   LDLDIRECT 165.1 03/08/2013   TRIG 50 02/18/2020   CHOLHDL 3.0 02/18/2020    Assessment/Plan 1. Situational depression -cont remeron 15mg  qhs and monitor -increase lorazepam as below -should improve with more to do in [address] when she can get there -appears to me that caregiver hours/time in presence of others helps her more than meds  2. Panic attacks -will make lorazepam available twice a day in case as she sometimes has periods of anxiety, frustration, tearfulness more often   3. Major neurocognitive disorder, due to another medical condition, without behavioral disturbance, moderate ([place]) -is waiting on a room in [address]   4. Aphasia due to recent stroke -continues with ST  Family/ staff Communication: d/w AL nurse  Labs/tests ordered:  No new  Carmen [name], D.O. [facility] [address], [place] [address] Cell Phone (Mon-Fri 8am-5pm):  [phone] On Call:  [phone] & follow prompts after 5pm & weekends Office Phone:  [phone] Office Fax:  [phone]

## 2020-03-17 ENCOUNTER — Encounter: Payer: Self-pay | Admitting: Internal Medicine

## 2020-03-24 ENCOUNTER — Encounter: Payer: Self-pay | Admitting: Internal Medicine

## 2020-03-25 DIAGNOSIS — N3941 Urge incontinence: Secondary | ICD-10-CM | POA: Diagnosis not present

## 2020-03-25 DIAGNOSIS — R350 Frequency of micturition: Secondary | ICD-10-CM | POA: Diagnosis not present

## 2020-03-27 ENCOUNTER — Encounter: Payer: Self-pay | Admitting: Internal Medicine

## 2020-03-31 ENCOUNTER — Encounter: Payer: Self-pay | Admitting: Internal Medicine

## 2020-04-01 ENCOUNTER — Encounter: Payer: Self-pay | Admitting: Internal Medicine

## 2020-04-03 ENCOUNTER — Encounter: Payer: Self-pay | Admitting: Neurology

## 2020-04-03 ENCOUNTER — Ambulatory Visit (INDEPENDENT_AMBULATORY_CARE_PROVIDER_SITE_OTHER): Payer: Medicare Other | Admitting: Neurology

## 2020-04-03 VITALS — BP 136/88 | HR 76 | Ht 63.0 in | Wt 133.0 lb

## 2020-04-03 DIAGNOSIS — I69321 Dysphasia following cerebral infarction: Secondary | ICD-10-CM | POA: Diagnosis not present

## 2020-04-03 DIAGNOSIS — I680 Cerebral amyloid angiopathy: Secondary | ICD-10-CM | POA: Diagnosis not present

## 2020-04-03 DIAGNOSIS — Z789 Other specified health status: Secondary | ICD-10-CM

## 2020-04-03 DIAGNOSIS — R413 Other amnesia: Secondary | ICD-10-CM | POA: Diagnosis not present

## 2020-04-03 DIAGNOSIS — I639 Cerebral infarction, unspecified: Secondary | ICD-10-CM | POA: Diagnosis not present
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
## 2020-04-03 NOTE — Patient Instructions (Signed)
It was good to see you both again today.  I am glad to hear that you have done fairly well since your recent hospitalization.  I agree with Dr. Cyndi Lennert recommendation for the [place].  We will follow along, I do not recommend any additional medication changes.  As discussed, you have stopped using your CPAP since last year since you could not tolerate it. [facility] receive great [facility] at [facility]. I would recommend a checkup with an ophthalmologist, if possible, with visual field testing. Please follow-up to see Ward Givens, nurse practitioner routinely in 6 months.

## 2020-04-14 ENCOUNTER — Telehealth: Payer: Self-pay | Admitting: Internal Medicine

## 2020-04-14 NOTE — Telephone Encounter (Signed)
I called the patient and was unable to reach the patient and leave a vm-TM

## 2020-04-23 DIAGNOSIS — L821 Other seborrheic keratosis: Secondary | ICD-10-CM | POA: Diagnosis not present

## 2020-04-23 DIAGNOSIS — Z85820 Personal history of malignant melanoma of skin: Secondary | ICD-10-CM | POA: Diagnosis not present

## 2020-04-23 DIAGNOSIS — Z85068 Personal history of other malignant neoplasm of small intestine: Secondary | ICD-10-CM | POA: Diagnosis not present

## 2020-04-23 DIAGNOSIS — L814 Other melanin hyperpigmentation: Secondary | ICD-10-CM | POA: Diagnosis not present

## 2020-05-05 ENCOUNTER — Other Ambulatory Visit: Payer: Self-pay | Admitting: Adult Health

## 2020-05-05 DIAGNOSIS — F418 Other specified anxiety disorders: Secondary | ICD-10-CM

## 2020-05-05 MED ORDER — LORAZEPAM 0.5 MG PO TABS
0.5000 mg | ORAL_TABLET | Freq: Two times a day (BID) | ORAL | 1 refills | Status: DC
Start: 2020-05-05 — End: 2020-08-01

## 2020-05-21 ENCOUNTER — Encounter: Payer: Self-pay | Admitting: Internal Medicine

## 2020-06-19 ENCOUNTER — Other Ambulatory Visit: Payer: Self-pay | Admitting: Adult Health

## 2020-06-19 MED ORDER — LORAZEPAM 0.5 MG PO TABS: 0.5000 mg | ORAL_TABLET | Freq: Every day | ORAL | 3 refills | Status: AC | PRN

## 2020-06-23 ENCOUNTER — Encounter: Payer: Self-pay | Admitting: Internal Medicine

## 2020-06-23 ENCOUNTER — Non-Acute Institutional Stay (SKILLED_NURSING_FACILITY): Payer: Medicare Other | Admitting: Internal Medicine

## 2020-06-23 DIAGNOSIS — I639 Cerebral infarction, unspecified: Secondary | ICD-10-CM

## 2020-06-23 DIAGNOSIS — F410 Panic disorder [episodic paroxysmal anxiety] without agoraphobia: Secondary | ICD-10-CM

## 2020-06-23 DIAGNOSIS — F418 Other specified anxiety disorders: Secondary | ICD-10-CM

## 2020-06-23 DIAGNOSIS — F0280 Dementia in other diseases classified elsewhere without behavioral disturbance: Secondary | ICD-10-CM

## 2020-06-23 DIAGNOSIS — G4733 Obstructive sleep apnea (adult) (pediatric): Secondary | ICD-10-CM

## 2020-06-23 DIAGNOSIS — I680 Cerebral amyloid angiopathy: Secondary | ICD-10-CM

## 2020-06-23 DIAGNOSIS — F4321 Adjustment disorder with depressed mood: Secondary | ICD-10-CM | POA: Diagnosis not present

## 2020-06-23 DIAGNOSIS — F02B0 Dementia in other diseases classified elsewhere, moderate, without behavioral disturbance, psychotic disturbance, mood disturbance, and anxiety: Secondary | ICD-10-CM

## 2020-06-23 DIAGNOSIS — D510 Vitamin B12 deficiency anemia due to intrinsic factor deficiency: Secondary | ICD-10-CM

## 2020-06-23 NOTE — Progress Notes (Signed)
Location: Occupational psychologist of Service:  SNF (31)  Provider:   Code Status: DNR Goals of Care:  Advanced Directives 03/12/2020  Does Patient Have a Medical Advance Directive? Yes  Type of Paramedic of [place];Out of facility DNR (pink MOST or yellow form)  Does patient want to make changes to medical advance directive? -  Copy of [place] in Chart? Yes - validated most recent copy scanned in chart (See row information)  Would patient like information on creating a medical advance directive? No - Patient declined  Pre-existing out of facility DNR order (yellow form or pink MOST form) Pink MOST/Yellow Form most recent copy in chart - Physician notified to receive inpatient order     Chief Complaint  Patient presents with  . Acute Visit    HPI: Patient is a 80 y.o. female seen today for an acute visit for Continuous use of Nuedexta  Patient has h/o Acute ischemic Stroke in 1/22 with Residual Aphasia H/o Amyloid Angiopathy Neurocognitive Impairnment H/o Microhemmorrhage in Cerebellum, Depression, HLD, Pernicious anemia on B12 injections and Anxiety H/o OSA not using CPAP Patient was admitted in Memory unit in 02/22  Initially she was having episodes of Crying Spontaneously And would not be able to control it. Dr Joya San her on Nuedexta. It seem it did help but now Son thinks it can be situational Episodes and probably does not need it I saw patient in her Room. She is doing really good per Nurses. No More Crying episodes Patient seemed very content. Playing Card with Staff Walking with Mild Assist     Past Medical History:  Diagnosis Date  . Anxiety    Per [facility] New Patient Packet   . Bleb x 4   . Depression    Per [facility] New Patient Packet   . Dermatitis due to solar radiation   . Dyshidrosis 05/03/2007  . Fuchs' corneal dystrophy    Per [facility] New Patient Packet   . Glaucoma   . Heart murmur   .  Hyperlipidemia    Per [facility] New Patient Packet   . Malignant melanoma ([place])    Per [facility] New Patient Packet   . Osteopenia   . Pernicious anemia    Per [place] New Patient Packet   . Tinnitus     Past Surgical History:  Procedure Laterality Date  . CORNEAL TRANSPLANT     Dr[facility], Per [facility] New Patient Packet   . MASTECTOMY    . PLACEMENT OF BREAST IMPLANTS      Allergies  Allergen Reactions  . Sulfonamide Derivatives Other (See Comments)    Doesn't remember    Outpatient Encounter Medications as of 06/23/2020  Medication Sig  . aspirin EC 81 MG tablet Take 81 mg by mouth daily.  . cyanocobalamin (,VITAMIN B-12,) 1000 MCG/ML injection INJECT 34mL MONTHLY  . LORazepam (ATIVAN) 0.5 MG tablet Take 1 tablet (0.5 mg total) by mouth 2 (two) times daily.  Marland Kitchen LORazepam (ATIVAN) 0.5 MG tablet Take 1 tablet (0.5 mg total) by mouth daily as needed for anxiety.  Marland Kitchen LUMIGAN 0.01 % SOLN Place 1 drop into both eyes daily.   . mirtazapine (REMERON) 15 MG tablet Take 1 tablet (15 mg total) by mouth at bedtime.  . pravastatin (PRAVACHOL) 10 MG tablet TAKE ONE TABLET DAILY  . [DISCONTINUED] NUEDEXTA 20-10 MG capsule Take 1 capsule by mouth 2 (two) times daily.   No facility-administered encounter medications on file  as of 06/23/2020.    Review of Systems:  Review of Systems Dementia Denies everything  Health Maintenance  Topic Date Due  . Hepatitis C Screening  Never done  . COVID-19 Vaccine (4 - Booster for Moderna series) 03/21/2020  . INFLUENZA VACCINE  09/08/2020  . TETANUS/TDAP  10/19/2020  . DEXA SCAN  Completed  . PNA vac Low Risk Adult  Completed  . HPV VACCINES  Aged Out    Physical Exam: Vitals:   06/23/20 1847  BP: 116/68  Pulse: 72  Resp: 16  Temp: (!) 97.5 F (36.4 C)  Weight: 127 lb 12.8 oz (58 kg)   Body mass index is 22.64 kg/m. Physical Exam  Constitutional:  Well-developed and well-nourished.  HENT:  Head: Normocephalic.  Mouth/Throat: Oropharynx is clear  and moist.  Eyes: Pupils are equal, round, and reactive to light.  Neck: Neck supple.  Cardiovascular: Normal rate and normal heart sounds.  No murmur heard. Pulmonary/Chest: Effort normal and breath sounds normal. No respiratory distress. No wheezes. She has no rales.  Abdominal: Soft. Bowel sounds are normal. No distension. There is no tenderness. There is no rebound.  Musculoskeletal: No edema.  Lymphadenopathy: none Neurological: No Focal Deficits Walks  holding hands Respond Appropriately Has mild Aphasia  Skin: Skin is warm and dry.  Psychiatric: Normal mood and affect. Behavior is normal. Thought content normal.    Labs reviewed: Basic Metabolic Panel: Recent Labs    02/17/20 1322 02/17/20 1344  NA 141 143  K 3.9 3.7  CL 106 107  CO2 23  --   GLUCOSE 140* 132*  BUN 16 19  CREATININE 0.78 0.70  CALCIUM 9.4  --    Liver Function Tests: Recent Labs    02/17/20 1322  AST 31  ALT 31  ALKPHOS 87  BILITOT <0.1*  PROT 7.8  ALBUMIN 4.5   No results for input(s): LIPASE, AMYLASE in the last 8760 hours. No results for input(s): AMMONIA in the last 8760 hours. CBC: Recent Labs    02/17/20 1322 02/17/20 1344  WBC 6.0  --   NEUTROABS 4.5  --   HGB 14.7 16.7*  HCT 47.7* 49.0*  MCV 87.7  --   PLT 255  --    Lipid Panel: Recent Labs    02/18/20 0541  CHOL 215*  HDL 71  LDLCALC 134*  TRIG 50  CHOLHDL 3.0   Lab Results  Component Value Date   HGBA1C 5.7 (H) 02/18/2020    Procedures since last visit: No results found.  Assessment/Plan 1. Cerebral amyloid angiopathy (CODE) On Aspirin  2. Acute ischemic stroke (HCC) Aspirin and Statin Will Need follow up Lipid Panel  3. Major neurocognitive disorder, due to another medical condition, without behavioral disturbance, moderate (HCC) In Memory unit Supportive care  4. Situational depression On Remeron  5. Situational anxiety Doing well  Discontinue Nuedexta Will follow Closely if Symptoms  come back will consider again  6. Panic attacks Ativan PRN also  7. OSA (obstructive sleep apnea) Does not use CPAP now  8. Pernicious anemia On B12 shots Hgb good levels    Labs/tests ordered:  * No order type specified * Next appt:  Visit date not found

## 2020-06-23 NOTE — Telephone Encounter (Signed)
Message routed to PCP Gupta, Anjali L, MD . Please Advise.  

## 2020-06-26 ENCOUNTER — Encounter: Payer: Self-pay | Admitting: Internal Medicine

## 2020-07-31 ENCOUNTER — Encounter: Payer: Self-pay | Admitting: Adult Health

## 2020-07-31 ENCOUNTER — Non-Acute Institutional Stay (SKILLED_NURSING_FACILITY): Payer: Medicare Other | Admitting: Adult Health

## 2020-07-31 DIAGNOSIS — G4733 Obstructive sleep apnea (adult) (pediatric): Secondary | ICD-10-CM | POA: Diagnosis not present

## 2020-07-31 DIAGNOSIS — D510 Vitamin B12 deficiency anemia due to intrinsic factor deficiency: Secondary | ICD-10-CM | POA: Diagnosis not present

## 2020-07-31 DIAGNOSIS — E7800 Pure hypercholesterolemia, unspecified: Secondary | ICD-10-CM | POA: Diagnosis not present

## 2020-07-31 DIAGNOSIS — F4321 Adjustment disorder with depressed mood: Secondary | ICD-10-CM | POA: Diagnosis not present

## 2020-07-31 DIAGNOSIS — I680 Cerebral amyloid angiopathy: Secondary | ICD-10-CM

## 2020-07-31 DIAGNOSIS — Z9989 Dependence on other enabling machines and devices: Secondary | ICD-10-CM

## 2020-07-31 NOTE — Progress Notes (Signed)
Location:  Occupational psychologist of Service:  SNF (31) Provider:   Cindi Carbon, [place] [phone]   Virgie Dad, MD  Patient Care Team: Virgie Dad, MD as PCP - General (Internal Medicine) Martinique, Amy, MD as Consulting Physician (Dermatology) Luberta Mutter, MD as Consulting Physician (Ophthalmology) Star Age, MD as Attending Physician (Neurology)  Extended Emergency Contact Information Primary Emergency Contact: [name] States of [place] Phone: [phone] Mobile Phone: [phone] Relation: Daughter Secondary Emergency Contact: Sharion Dove of [place] Phone: [phone] Work Phone: [phone] Mobile Phone: [phone] Relation: Son   Goals of care: Advanced Directive information Advanced Directives 03/12/2020  Does Patient Have a Medical Advance Directive? Yes  Type of Paramedic of [place];Out of facility DNR (pink MOST or yellow form)  Does patient want to make changes to medical advance directive? -  Copy of [place] in Chart? Yes - validated most recent copy scanned in chart (See row information)  Would patient like information on creating a medical advance directive? No - Patient declined  Pre-existing out of facility DNR order (yellow form or pink MOST form) Pink MOST/Yellow Form most recent copy in chart - Physician notified to receive inpatient order     Chief Complaint  Patient presents with   Medical Management of Chronic Issues    HPI:  Pt is a 80 y.o. female seen today for medical management of chronic diseases.  Ms Rathje resides in the memory care unit at [facility]. PMH significant for dementia with cerebral amyloid angiopathy, depression, anemia, anixety, HLD, OSA, CVA, glaucoma, and melanoma. She was started back on Nuedexta after a trial off and seems much better in terms of crying/anxiety episodes. Her  caregiver is with her today and there are no acute concerns reported regarding her care. She has had some weight loss in the past but has gained 1 lb since last month and takes remeron.  Wt Readings from Last 3 Encounters:  07/31/20 128 lb (58.1 kg)  06/23/20 127 lb 12.8 oz (58 kg)  04/03/20 133 lb (60.3 kg)    Past Medical History:  Diagnosis Date   Anxiety    Per [place] New Patient Packet    Bleb x 4    Depression    Per [place] New Patient Packet    Dermatitis due to solar radiation    Dyshidrosis 05/03/2007   Fuchs' corneal dystrophy    Per [facility] New Patient Packet    Glaucoma    Heart murmur    Hyperlipidemia    Per [facility] New Patient Packet    Malignant melanoma ([place])    Per [place] New Patient Packet    Osteopenia    Pernicious anemia    Per [place] New Patient Packet    Tinnitus    Past Surgical History:  Procedure Laterality Date   CORNEAL TRANSPLANT     Dr.Carlson[facility], Per [place] New Patient Packet    MASTECTOMY     PLACEMENT OF BREAST IMPLANTS      Allergies  Allergen Reactions   Sulfonamide Derivatives Other (See Comments)    Doesn't remember    Outpatient Encounter Medications as of 07/31/2020  Medication Sig   Dextromethorphan-quiNIDine (NUEDEXTA) 20-10 MG capsule Take 1 capsule by mouth 2 (two) times daily.   aspirin EC 81 MG tablet Take 81 mg by mouth daily.   cyanocobalamin (,VITAMIN B-12,) 1000 MCG/ML injection INJECT 72mL MONTHLY   LORazepam (  ATIVAN) 0.5 MG tablet Take 1 tablet (0.5 mg total) by mouth 2 (two) times daily.   LORazepam (ATIVAN) 0.5 MG tablet Take 1 tablet (0.5 mg total) by mouth daily as needed for anxiety.   LUMIGAN 0.01 % SOLN Place 1 drop into both eyes daily.    mirtazapine (REMERON) 15 MG tablet Take 1 tablet (15 mg total) by mouth at bedtime.   pravastatin (PRAVACHOL) 10 MG tablet TAKE ONE TABLET DAILY   No facility-administered encounter medications on file as of 07/31/2020.    Review of Systems  Constitutional:  Negative for activity change,  appetite change, chills, diaphoresis, fatigue, fever and unexpected weight change.  HENT:  Negative for congestion.   Respiratory:  Negative for cough, shortness of breath and wheezing.   Cardiovascular:  Negative for chest pain, palpitations and leg swelling.  Gastrointestinal:  Negative for abdominal distention, abdominal pain, constipation and diarrhea.  Genitourinary:  Negative for difficulty urinating and dysuria.  Musculoskeletal:  Negative for arthralgias, back pain, gait problem, joint swelling and myalgias.  Neurological:  Negative for dizziness, tremors, seizures, syncope, facial asymmetry, speech difficulty, weakness, light-headedness, numbness and headaches.  Psychiatric/Behavioral:  Positive for confusion. Negative for agitation and behavioral problems. The patient is nervous/anxious.    Immunization History  Administered Date(s) Administered   Influenza Split 10/25/2011   Influenza Whole 02/08/2005, 11/08/2008, 11/15/2009, 10/20/2010   Influenza, High Dose Seasonal PF 02/12/2014, 12/19/2015, 12/25/2017, 12/06/2018, 11/30/2019   Influenza-Unspecified 10/10/2014, 12/12/2016, 01/22/2018   Moderna Sars-Covid-2 Vaccination 02/20/2019, 03/25/2019, 12/20/2019   Pneumococcal Conjugate-13 03/08/2013   Pneumococcal Polysaccharide-23 05/03/2007   Td 02/09/2000   Tdap 10/20/2010   Pertinent  Health Maintenance Due  Topic Date Due   INFLUENZA VACCINE  09/08/2020   DEXA SCAN  Completed   PNA vac Low Risk Adult  Completed   Fall Risk  02/27/2020 01/09/2020 09/12/2019 03/28/2019 02/28/2019  Falls in the past year? 0 0 0 0 0  Comment - - - - -  Number falls in past yr: 0 0 0 0 0  Injury with Fall? 0 0 0 0 0   Functional Status Survey:    Vitals:   07/31/20 1034  Weight: 128 lb (58.1 kg)   Body mass index is 22.67 kg/m. Physical Exam Vitals and nursing note reviewed.  Constitutional:      General: She is not in acute distress.    Appearance: She is not diaphoretic.  HENT:      Head: Normocephalic and atraumatic.     Mouth/Throat:     Mouth: Mucous membranes are moist.     Pharynx: Oropharynx is clear.  Neck:     Vascular: No JVD.  Cardiovascular:     Rate and Rhythm: Normal rate and regular rhythm.     Heart sounds: No murmur heard. Pulmonary:     Effort: Pulmonary effort is normal. No respiratory distress.     Breath sounds: Normal breath sounds. No wheezing.  Abdominal:     General: Bowel sounds are normal. There is no distension.     Palpations: Abdomen is soft.     Tenderness: There is no abdominal tenderness.  Musculoskeletal:     Cervical back: No rigidity or tenderness.  Lymphadenopathy:     Cervical: No cervical adenopathy.  Skin:    General: Skin is warm and dry.  Neurological:     Mental Status: She is alert. Mental status is at baseline.     Comments: Slight weakness on the left side  Psychiatric:  Mood and Affect: Mood normal.    Labs reviewed: Recent Labs    02/17/20 1322 02/17/20 1344  NA 141 143  K 3.9 3.7  CL 106 107  CO2 23  --   GLUCOSE 140* 132*  BUN 16 19  CREATININE 0.78 0.70  CALCIUM 9.4  --    Recent Labs    02/17/20 1322  AST 31  ALT 31  ALKPHOS 87  BILITOT <0.1*  PROT 7.8  ALBUMIN 4.5   Recent Labs    02/17/20 1322 02/17/20 1344  WBC 6.0  --   NEUTROABS 4.5  --   HGB 14.7 16.7*  HCT 47.7* 49.0*  MCV 87.7  --   PLT 255  --    Lab Results  Component Value Date   TSH 1.77 04/11/2017   Lab Results  Component Value Date   HGBA1C 5.7 (H) 02/18/2020   Lab Results  Component Value Date   CHOL 215 (H) 02/18/2020   HDL 71 02/18/2020   LDLCALC 134 (H) 02/18/2020   LDLDIRECT 165.1 03/08/2013   TRIG 50 02/18/2020   CHOLHDL 3.0 02/18/2020    Significant Diagnostic Results in last 30 days:  No results found.  Assessment/Plan  1. Cerebral amyloid angiopathy (CODE) Continues to benefit from memory care support and is adjusting well Continue asa due to prior hx of CVA per neuro   2. OSA  on CPAP Could not tolerate equipment  No sleep issues   3. Situational depression Recommend continuing Nuedexta and Remeron.  Continue ativan as well for periods of anxiety   4. Pernicious anemia Lab Results  Component Value Date   HGB 16.7 (H) 02/17/2020   Check CBC next month   5. Pure hypercholesterolemia Lab Results  Component Value Date   LDLCALC 134 (H) 02/18/2020  Continue pravachol   Labs/tests ordered:  recommend CBC and BMP next month

## 2020-08-01 ENCOUNTER — Other Ambulatory Visit: Payer: Self-pay | Admitting: Adult Health

## 2020-08-01 ENCOUNTER — Encounter: Payer: Self-pay | Admitting: Adult Health

## 2020-08-01 DIAGNOSIS — F418 Other specified anxiety disorders: Secondary | ICD-10-CM

## 2020-08-01 MED ORDER — LORAZEPAM 0.5 MG PO TABS: 0.5000 mg | ORAL_TABLET | Freq: Two times a day (BID) | ORAL | 3 refills | Status: DC

## 2020-08-19 ENCOUNTER — Non-Acute Institutional Stay (SKILLED_NURSING_FACILITY): Payer: Medicare Other | Admitting: Orthopedic Surgery

## 2020-08-19 ENCOUNTER — Encounter: Payer: Self-pay | Admitting: Orthopedic Surgery

## 2020-08-19 DIAGNOSIS — R1110 Vomiting, unspecified: Secondary | ICD-10-CM

## 2020-08-19 DIAGNOSIS — R140 Abdominal distension (gaseous): Secondary | ICD-10-CM | POA: Diagnosis not present

## 2020-08-19 MED ORDER — POLYETHYLENE GLYCOL 3350 17 GM/SCOOP PO POWD: 17.0000 g | Freq: Two times a day (BID) | ORAL | 1 refills | Status: AC | PRN

## 2020-08-19 MED ORDER — DOCUSATE SODIUM 100 MG PO CAPS: 100.0000 mg | ORAL_CAPSULE | Freq: Two times a day (BID) | ORAL | 0 refills | Status: DC

## 2020-08-19 NOTE — Progress Notes (Signed)
Location:  [place] Room Number: 833 Place of Service:  SNF [phone]) Provider:  Windell Moulding, AGNP-C  Virgie Dad, MD  Patient Care Team: Virgie Dad, MD as PCP - General (Internal Medicine) [place], Shante [place], MD as Consulting Physician (Dermatology) Luberta Mutter, MD as Consulting Physician (Ophthalmology) Star Age, MD as Attending Physician (Neurology)  Extended Emergency Contact Information Primary Emergency Contact: [name] States of [place] Phone: [phone] Mobile Phone: [phone] Relation: Daughter Secondary Emergency Contact: Sharion Dove of [place] Phone: [phone] Work Phone: [phone] Mobile Phone: [phone] Relation: Son  Code Status:  DNR Goals of care: Advanced Directive information Advanced Directives 03/12/2020  Does Patient Have a Medical Advance Directive? Yes  Type of Paramedic of [place];Out of facility DNR (pink MOST or yellow form)  Does patient want to make changes to medical advance directive? -  Copy of [place] in Chart? Yes - validated most recent copy scanned in chart (See row information)  Would patient like information on creating a medical advance directive? No - Patient declined  Pre-existing out of facility DNR order (yellow form or pink MOST form) Pink MOST/Yellow Form most recent copy in chart - Physician notified to receive inpatient order     Chief Complaint  Patient presents with   Acute Visit    vomiting    HPI:  Pt is a 80 y.o. female seen today for acute visit due to vomiting.   She resides on the memory care unit at [facility] due to dementia with cerebral amyloid angiopathy.   Nurse reports patient vomiting after lunch this afternoon. [name] reports "moderate" amount. She has had daily bowel movements for the past 3 days. Today, her bowel movement was reported as small.   Nurse does nor  report any other concerns, vitals stable.    Past Medical History:  Diagnosis Date   Anxiety    Per [place] New Patient Packet    Bleb x 4    Cerebral amyloid angiopathy (CODE) 12/09/2018   Depression    Per [place] New Patient Packet    Dermatitis due to solar radiation    Dyshidrosis 05/03/2007   Fuchs' corneal dystrophy    Per [facility] New Patient Packet    Glaucoma    Heart murmur    Hyperlipidemia    Per [facility] New Patient Packet    Malignant melanoma ([place])    Per [place] New Patient Packet    Osteopenia    Pernicious anemia    Per [place] New Patient Packet    Tinnitus    Past Surgical History:  Procedure Laterality Date   CORNEAL TRANSPLANT     Dr.Carlson[facility], Per [place] New Patient Packet    MASTECTOMY     PLACEMENT OF BREAST IMPLANTS      Allergies  Allergen Reactions   Sulfonamide Derivatives Other (See Comments)    Doesn't remember    Outpatient Encounter Medications as of 08/19/2020  Medication Sig   aspirin EC 81 MG tablet Take 81 mg by mouth daily.   cyanocobalamin (,VITAMIN B-12,) 1000 MCG/ML injection INJECT 1mL MONTHLY   Dextromethorphan-quiNIDine (NUEDEXTA) 20-10 MG capsule Take 1 capsule by mouth 2 (two) times daily.   LORazepam (ATIVAN) 0.5 MG tablet Take 1 tablet (0.5 mg total) by mouth daily as needed for anxiety.   LORazepam (ATIVAN) 0.5 MG tablet Take 1 tablet (0.5 mg total) by mouth 2 (two) times daily.   LUMIGAN 0.01 %  SOLN Place 1 drop into both eyes daily.    mirtazapine (REMERON) 15 MG tablet Take 1 tablet (15 mg total) by mouth at bedtime.   pravastatin (PRAVACHOL) 10 MG tablet TAKE ONE TABLET DAILY   No facility-administered encounter medications on file as of 08/19/2020.    Review of Systems  Unable to perform ROS: Dementia   Immunization History  Administered Date(s) Administered   Influenza Split 10/25/2011   Influenza Whole 02/08/2005, 11/08/2008, 11/15/2009, 10/20/2010   Influenza, High Dose Seasonal PF 02/12/2014, 12/19/2015, 12/25/2017,  12/06/2018, 11/30/2019   Influenza-Unspecified 10/10/2014, 12/12/2016, 01/22/2018   Moderna Sars-Covid-2 Vaccination 02/20/2019, 03/25/2019, 12/20/2019   Pneumococcal Conjugate-13 03/08/2013   Pneumococcal Polysaccharide-23 05/03/2007   Td 02/09/2000   Tdap 10/20/2010   Pertinent  Health Maintenance Due  Topic Date Due   INFLUENZA VACCINE  09/08/2020   DEXA SCAN  Completed   PNA vac Low Risk Adult  Completed   Fall Risk  02/27/2020 01/09/2020 09/12/2019 03/28/2019 02/28/2019  Falls in the past year? 0 0 0 0 0  Comment - - - - -  Number falls in past yr: 0 0 0 0 0  Injury with Fall? 0 0 0 0 0   Functional Status Survey:    Vitals:   08/19/20 1607  BP: 131/78  Pulse: 83  Resp: 15  Temp: (!) 97.2 F (36.2 C)  SpO2: 97%  Weight: 130 lb 6.4 oz (59.1 kg)   Body mass index is 23.1 kg/m. Physical Exam Vitals reviewed.  Constitutional:      General: She is not in acute distress. Cardiovascular:     Rate and Rhythm: Normal rate and regular rhythm.     Pulses: Normal pulses.     Heart sounds: Normal heart sounds. No murmur heard. Pulmonary:     Effort: Pulmonary effort is normal. No respiratory distress.     Breath sounds: Normal breath sounds. No wheezing.  Abdominal:     General: There is distension.     Tenderness: There is abdominal tenderness.     Comments: Bowel sounds hypoactive x 4   Musculoskeletal:     Right lower leg: No edema.     Left lower leg: No edema.  Skin:    General: Skin is warm and dry.     Capillary Refill: Capillary refill takes less than 2 seconds.  Neurological:     General: No focal deficit present.     Mental Status: She is alert. Mental status is at baseline.     Motor: Weakness present.     Gait: Gait abnormal.  Psychiatric:        Mood and Affect: Mood normal.        Behavior: Behavior normal.        Cognition and Memory: Memory is impaired.    Labs reviewed: Recent Labs    02/17/20 1322 02/17/20 1344  NA 141 143  K 3.9 3.7  CL  106 107  CO2 23  --   GLUCOSE 140* 132*  BUN 16 19  CREATININE 0.78 0.70  CALCIUM 9.4  --    Recent Labs    02/17/20 1322  AST 31  ALT 31  ALKPHOS 87  BILITOT <0.1*  PROT 7.8  ALBUMIN 4.5   Recent Labs    02/17/20 1322 02/17/20 1344  WBC 6.0  --   NEUTROABS 4.5  --   HGB 14.7 16.7*  HCT 47.7* 49.0*  MCV 87.7  --   PLT 255  --  Lab Results  Component Value Date   TSH 1.77 04/11/2017   Lab Results  Component Value Date   HGBA1C 5.7 (H) 02/18/2020   Lab Results  Component Value Date   CHOL 215 (H) 02/18/2020   HDL 71 02/18/2020   LDLCALC 134 (H) 02/18/2020   LDLDIRECT 165.1 03/08/2013   TRIG 50 02/18/2020   CHOLHDL 3.0 02/18/2020    Significant Diagnostic Results in last 30 days:  No results found.  Assessment/Plan 1. Vomiting, intractability of vomiting not specified, presence of nausea not specified, unspecified vomiting type - suspect due to abdominal distension/constipation - zofran 4 mg q6 hrs prn - abdominal x-ray to r/o ileus - colace 100 mg po bid x 5 days, then daily - miralax 17 g now, bid x 2 days then daily prn - bland diet tonight, may consider clear liquids if vomiting persists  2. Abdominal distension - bowel sounds hypoactive, unknown flatus - see above - docusate sodium (COLACE) 100 MG capsule; Take 1 capsule (100 mg total) by mouth 2 (two) times daily.  Dispense: 10 capsule; Refill: 0 - polyethylene glycol powder (GLYCOLAX/MIRALAX) 17 GM/SCOOP powder; Take 17 g by mouth 2 (two) times daily as needed for up to 2 days for moderate constipation.  Dispense: 3350 g; Refill: 1   Family/ staff Communication: plan discussed with nurse   Labs/tests ordered:  abdominal x-ray

## 2020-09-02 ENCOUNTER — Non-Acute Institutional Stay (SKILLED_NURSING_FACILITY): Payer: Medicare Other | Admitting: Orthopedic Surgery

## 2020-09-02 ENCOUNTER — Encounter: Payer: Self-pay | Admitting: Orthopedic Surgery

## 2020-09-02 DIAGNOSIS — R1110 Vomiting, unspecified: Secondary | ICD-10-CM

## 2020-09-02 DIAGNOSIS — U071 COVID-19: Secondary | ICD-10-CM

## 2020-09-02 DIAGNOSIS — G4733 Obstructive sleep apnea (adult) (pediatric): Secondary | ICD-10-CM

## 2020-09-02 DIAGNOSIS — I680 Cerebral amyloid angiopathy: Secondary | ICD-10-CM | POA: Diagnosis not present

## 2020-09-02 DIAGNOSIS — E7800 Pure hypercholesterolemia, unspecified: Secondary | ICD-10-CM

## 2020-09-02 DIAGNOSIS — D510 Vitamin B12 deficiency anemia due to intrinsic factor deficiency: Secondary | ICD-10-CM

## 2020-09-02 DIAGNOSIS — Z9989 Dependence on other enabling machines and devices: Secondary | ICD-10-CM

## 2020-09-02 NOTE — Progress Notes (Signed)
Location:  [place] Room Number: [id number] Place of Service:  SNF [phone]) Provider:  Windell Moulding, AGNP-C  Virgie Dad, MD  Patient Care Team: Virgie Dad, MD as PCP - General (Internal Medicine) [place], Lundyn Coste, MD as Consulting Physician (Dermatology) Luberta Mutter, MD as Consulting Physician (Ophthalmology) Star Age, MD as Attending Physician (Neurology)  Extended Emergency Contact Information Primary Emergency Contact: [name] States of [place] Phone: [phone] Mobile Phone: [phone] Relation: Daughter Secondary Emergency Contact: Sharion Dove of [place] Phone: [phone] Work Phone: [phone] Mobile Phone: [phone] Relation: Son  Code Status: DNR Goals of care: Advanced Directive information Advanced Directives 03/12/2020  Does Patient Have a Medical Advance Directive? Yes  Type of Paramedic of [place];Out of facility DNR (pink MOST or yellow form)  Does patient want to make changes to medical advance directive? -  Copy of [place] in Chart? Yes - validated most recent copy scanned in chart (See row information)  Would patient like information on creating a medical advance directive? No - Patient declined  Pre-existing out of facility DNR order (yellow form or pink MOST form) Pink MOST/Yellow Form most recent copy in chart - Physician notified to receive inpatient order     Chief Complaint  Patient presents with   Medical Management of Chronic Issues    HPI:  Pt is a 80 y.o. female seen today for medical management of chronic diseases.    She resides on the memory care unit at [facility] due to dementia with cerebral amyloid angiopathy. Past medical history includes: stroke, OSA, cataracts, glaucoma, melanoma,  HLD, anxiety and depression.   07/19 pcr positive for covid 19. She only complained of a headache once. Today no  symptoms, remains on isolation protocol x 10 days.   07/13 hematoma noted above left eye measuring 5 cm x 3 cm. Injury unwitnessed.   Nudexta restarted last month, nursing staff denies increased behaviors of crying and anxiety.   07/12 seen for nausea and vomiting. KUB unremarkable. Symptoms improved with prn Zofran.   Often has caregiver with her, not present today.   Recent blood pressure:  07/26- 132/64  07/25- 131/81, 113/73  07/24- 143/82  Recent weights:  07/01- 130.4 lbs  06/01- 128.0 lbs  05/01- 127.8 lbs  Nurse does not report any concerns, vital stable.       Past Medical History:  Diagnosis Date   Anxiety    Per [place] New Patient Packet    Bleb x 4    Cerebral amyloid angiopathy (CODE) 12/09/2018   Depression    Per [place] New Patient Packet    Dermatitis due to solar radiation    Dyshidrosis 05/03/2007   Fuchs' corneal dystrophy    Per [facility] New Patient Packet    Glaucoma    Heart murmur    Hyperlipidemia    Per [facility] New Patient Packet    Malignant melanoma ([place])    Per [place] New Patient Packet    Osteopenia    Pernicious anemia    Per [place] New Patient Packet    Tinnitus    Past Surgical History:  Procedure Laterality Date   CORNEAL TRANSPLANT     Dr.[facility]son[facility], Per [facility] New Patient Packet    MASTECTOMY     PLACEMENT OF BREAST IMPLANTS      Allergies  Allergen Reactions   Sulfonamide Derivatives Other (See Comments)    Doesn't remember    Outpatient  Encounter Medications as of 09/02/2020  Medication Sig   aspirin EC 81 MG tablet Take 81 mg by mouth daily.   cyanocobalamin (,VITAMIN B-12,) 1000 MCG/ML injection INJECT 42m MONTHLY   Dextromethorphan-quiNIDine (NUEDEXTA) 20-10 MG capsule Take 1 capsule by mouth 2 (two) times daily.   docusate sodium (COLACE) 100 MG capsule Take 1 capsule (100 mg total) by mouth 2 (two) times daily.   LORazepam (ATIVAN) 0.5 MG tablet Take 1 tablet (0.5 mg total) by mouth daily as needed for anxiety.   LORazepam  (ATIVAN) 0.5 MG tablet Take 1 tablet (0.5 mg total) by mouth 2 (two) times daily.   LUMIGAN 0.01 % SOLN Place 1 drop into both eyes daily.    mirtazapine (REMERON) 15 MG tablet Take 1 tablet (15 mg total) by mouth at bedtime.   pravastatin (PRAVACHOL) 10 MG tablet TAKE ONE TABLET DAILY   No facility-administered encounter medications on file as of 09/02/2020.    Review of Systems  Unable to perform ROS: Dementia   Immunization History  Administered Date(s) Administered   Influenza Split 10/25/2011   Influenza Whole 02/08/2005, 11/08/2008, 11/15/2009, 10/20/2010   Influenza, High Dose Seasonal PF 02/12/2014, 12/19/2015, 12/25/2017, 12/06/2018, 11/30/2019   Influenza-Unspecified 10/10/2014, 12/12/2016, 01/22/2018   Moderna Sars-Covid-2 Vaccination 02/20/2019, 03/25/2019, 12/20/2019   Pneumococcal Conjugate-13 03/08/2013   Pneumococcal Polysaccharide-23 05/03/2007   Td 02/09/2000   Tdap 10/20/2010   Pertinent  Health Maintenance Due  Topic Date Due   INFLUENZA VACCINE  09/08/2020   DEXA SCAN  Completed   PNA vac Low Risk Adult  Completed   Fall Risk  02/27/2020 01/09/2020 09/12/2019 03/28/2019 02/28/2019  Falls in the past year? 0 0 0 0 0  Comment - - - - -  Number falls in past yr: 0 0 0 0 0  Injury with Fall? 0 0 0 0 0   Functional Status Survey:    Vitals:   09/02/20 1339  BP: 132/64  Pulse: 76  Resp: 18  Temp: (!) 97.2 F (36.2 C)  SpO2: 97%  Weight: 130 lb 6.4 oz (59.1 kg)   Body mass index is 23.1 kg/m. Physical Exam Vitals reviewed.  Constitutional:      General: She is not in acute distress. HENT:     Head: Normocephalic.     Right Ear: There is no impacted cerumen.     Left Ear: There is no impacted cerumen.     Nose: Nose normal.     Mouth/Throat:     Mouth: Mucous membranes are moist.     Pharynx: No posterior oropharyngeal erythema.  Eyes:     General:        Right eye: No discharge.        Left eye: No discharge.  Neck:     Vascular: No carotid  bruit.  Cardiovascular:     Rate and Rhythm: Normal rate and regular rhythm.     Pulses: Normal pulses.     Heart sounds: Normal heart sounds. No murmur heard. Pulmonary:     Effort: Pulmonary effort is normal. No respiratory distress.     Breath sounds: Normal breath sounds. No wheezing.  Abdominal:     General: Bowel sounds are normal. There is no distension.     Palpations: Abdomen is soft.     Tenderness: There is no abdominal tenderness.  Musculoskeletal:     Cervical back: Normal range of motion.     Right lower leg: No edema.     Left lower  leg: No edema.  Lymphadenopathy:     Cervical: No cervical adenopathy.  Skin:    General: Skin is warm and dry.     Capillary Refill: Capillary refill takes less than 2 seconds.  Neurological:     General: No focal deficit present.     Mental Status: She is alert. Mental status is at baseline.     Motor: Weakness present.     Gait: Gait abnormal.     Comments: Slow gait  Psychiatric:        Mood and Affect: Mood normal.        Behavior: Behavior normal.    Labs reviewed: Recent Labs    02/17/20 1322 02/17/20 1344  NA 141 143  K 3.9 3.7  CL 106 107  CO2 23  --   GLUCOSE 140* 132*  BUN 16 19  CREATININE 0.78 0.70  CALCIUM 9.4  --    Recent Labs    02/17/20 1322  AST 31  ALT 31  ALKPHOS 87  BILITOT <0.1*  PROT 7.8  ALBUMIN 4.5   Recent Labs    02/17/20 1322 02/17/20 1344  WBC 6.0  --   NEUTROABS 4.5  --   HGB 14.7 16.7*  HCT 47.7* 49.0*  MCV 87.7  --   PLT 255  --    Lab Results  Component Value Date   TSH 1.77 04/11/2017   Lab Results  Component Value Date   HGBA1C 5.7 (H) 02/18/2020   Lab Results  Component Value Date   CHOL 215 (H) 02/18/2020   HDL 71 02/18/2020   LDLCALC 134 (H) 02/18/2020   LDLDIRECT 165.1 03/08/2013   TRIG 50 02/18/2020   CHOLHDL 3.0 02/18/2020    Significant Diagnostic Results in last 30 days:  No results found.  Assessment/Plan 1. COVID-19 - positive pcr 07/19 -  symptoms minimal, c/o headache once - exam unremarkable today - cont isolation precautions - she received second covid booster 06/05  2. Cerebral amyloid angiopathy (CODE) - cont asa due to history of CVA - cont memory care support  3. OSA on CPAP - noncompliant with equipment - no sleep issues at this time  4. Vomiting, intractability of vomiting not specified, presence of nausea not specified, unspecified vomiting type - resolved   5. Pernicious anemia - hgb 16.7 02/17/2020 - cbc/diff  6. Pure hypercholesterolemia - LDL 134 02/18/2020 - lipid panel      Family/ staff Communication: plan discussed with nurse  Labs/tests ordered:  cbc/diff, cmp, lipid panel

## 2020-09-03 LAB — COMPREHENSIVE METABOLIC PANEL
Albumin: 4.4 (ref 3.5–5.0)
Calcium: 9.6 (ref 8.7–10.7)
Globulin: 2.6

## 2020-09-03 LAB — HEPATIC FUNCTION PANEL
ALT: 15 (ref 7–35)
AST: 27 (ref 13–35)
Alkaline Phosphatase: 104 (ref 25–125)
Bilirubin, Total: 0.2

## 2020-09-03 LAB — CBC: RBC: 4.7 (ref 3.87–5.11)

## 2020-09-03 LAB — CBC AND DIFFERENTIAL
HCT: 39 (ref 36–46)
Hemoglobin: 13.1 (ref 12.0–16.0)
Platelets: 397 (ref 150–399)
WBC: 5.3

## 2020-09-03 LAB — LIPID PANEL
Cholesterol: 185 (ref 0–200)
HDL: 52 (ref 35–70)
LDL Cholesterol: 114
Triglycerides: 118 (ref 40–160)

## 2020-09-03 LAB — BASIC METABOLIC PANEL
BUN: 22 — AB (ref 4–21)
CO2: 26 — AB (ref 13–22)
Chloride: 105 (ref 99–108)
Creatinine: 0.9 (ref 0.5–1.1)
Glucose: 83
Potassium: 4.4 (ref 3.4–5.3)
Sodium: 145 (ref 137–147)

## 2020-09-25 ENCOUNTER — Encounter: Payer: Self-pay | Admitting: Adult Health

## 2020-09-25 ENCOUNTER — Non-Acute Institutional Stay (SKILLED_NURSING_FACILITY): Payer: Medicare Other | Admitting: Adult Health

## 2020-09-25 DIAGNOSIS — Z66 Do not resuscitate: Secondary | ICD-10-CM

## 2020-09-25 DIAGNOSIS — F410 Panic disorder [episodic paroxysmal anxiety] without agoraphobia: Secondary | ICD-10-CM

## 2020-09-25 DIAGNOSIS — F0280 Dementia in other diseases classified elsewhere without behavioral disturbance: Secondary | ICD-10-CM

## 2020-09-25 DIAGNOSIS — D510 Vitamin B12 deficiency anemia due to intrinsic factor deficiency: Secondary | ICD-10-CM | POA: Diagnosis not present

## 2020-09-25 DIAGNOSIS — I680 Cerebral amyloid angiopathy: Secondary | ICD-10-CM

## 2020-09-25 DIAGNOSIS — E782 Mixed hyperlipidemia: Secondary | ICD-10-CM

## 2020-09-25 DIAGNOSIS — F02B0 Dementia in other diseases classified elsewhere, moderate, without behavioral disturbance, psychotic disturbance, mood disturbance, and anxiety: Secondary | ICD-10-CM

## 2020-09-25 NOTE — Progress Notes (Signed)
Location:  [place] Room Number: [id number] Place of Service:  SNF [phone]) Provider:  Royal Hawthorn, NP   Patient Care Team: Virgie Dad, MD as PCP - General (Internal Medicine) Martinique, Amy, MD as Consulting Physician (Dermatology) Luberta Mutter, MD as Consulting Physician (Ophthalmology) Star Age, MD as Attending Physician (Neurology)  Extended Emergency Contact Information Primary Emergency Contact: [name] [place] of [place] Phone: [phone] Mobile Phone: [phone] Relation: Daughter Secondary Emergency Contact: Sharion Dove of [place] Phone: [phone] Work Phone: [phone] Mobile Phone: [phone] Relation: Son  Code Status:  DNR Goals of care: Advanced Directive information Advanced Directives 09/25/2020  Does Patient Have a Medical Advance Directive? Yes  Type of Advance Directive Out of facility DNR (pink MOST or yellow form)  Does patient want to make changes to medical advance directive? No - Patient declined  Copy of [place] in Chart? -  Would patient like information on creating a medical advance directive? -  Pre-existing out of facility DNR order (yellow form or pink MOST form) Yellow form placed in chart (order not valid for inpatient use)     Chief Complaint  Patient presents with   Medical Management of Chronic Issues    Routine visit, discuss need for hep c screening and flu vaccine or exclude.     HPI:  Pt is a 80 y.o. female seen today for medical management of chronic diseases.  PMH significant for major neurocognitive disorder, anxiety with panic attacks, PBA, constipation, depression, glaucoma, pernicious anemia, osteopenia, HLD, CVA.   No acute complaints  Recovered from very mild covid last month.   Continues to remain ambulatory and participate in activities  BP controlled  Weight stable   Wt Readings from Last 3 Encounters:   09/25/20 129 lb 6.4 oz (58.7 kg)  09/02/20 130 lb 6.4 oz (59.1 kg)  08/19/20 130 lb 6.4 oz (59.1 kg)   MRI 02/17/20 IMPRESSION: 1. 1.7 cm acute ischemic nonhemorrhagic infarct involving the left frontal corona radiata. 2. Innumerable chronic micro hemorrhages scattered throughout both cerebral and cerebellar hemispheres, most consistent with severe cerebral amyloid angiopathy. 3. Underlying age-related cerebral atrophy with moderately advanced chronic microvascular ischemic disease. Past Medical History:  Diagnosis Date   Anxiety    Per [place] New Patient Packet    Bleb x 4    Cerebral amyloid angiopathy (CODE) 12/09/2018   Depression    Per [place] New Patient Packet    Dermatitis due to solar radiation    Dyshidrosis 05/03/2007   Fuchs' corneal dystrophy    Per [facility] New Patient Packet    Glaucoma    Heart murmur    Hyperlipidemia    Per [facility] New Patient Packet    Malignant melanoma ([place])    Per [place] New Patient Packet    Osteopenia    Pernicious anemia    Per [place] New Patient Packet    Tinnitus    Past Surgical History:  Procedure Laterality Date   CORNEAL TRANSPLANT     Dr.Carlson-Duke, Per [place] New Patient Packet    MASTECTOMY     PLACEMENT OF BREAST IMPLANTS      Allergies  Allergen Reactions   Sulfonamide Derivatives Other (See Comments)    Doesn't remember    Outpatient Encounter Medications as of 09/25/2020  Medication Sig   aspirin EC 81 MG tablet Take 81 mg by mouth daily.   cyanocobalamin (,VITAMIN B-12,) 1000 MCG/ML injection INJECT 81m MONTHLY  Dextromethorphan-quiNIDine (NUEDEXTA) 20-10 MG capsule Take 1 capsule by mouth 2 (two) times daily.   docusate sodium (COLACE) 100 MG capsule Take 1 capsule (100 mg total) by mouth 2 (two) times daily.   LORazepam (ATIVAN) 0.5 MG tablet Take 1 tablet (0.5 mg total) by mouth daily as needed for anxiety.   LORazepam (ATIVAN) 0.5 MG tablet Take 1 tablet (0.5 mg total) by mouth 2 (two) times daily.   LUMIGAN 0.01 % SOLN  Place 1 drop into both eyes daily.    mirtazapine (REMERON) 15 MG tablet Take 1 tablet (15 mg total) by mouth at bedtime.   ondansetron (ZOFRAN) 4 MG tablet Take 4 mg by mouth 4 (four) times daily as needed.   pravastatin (PRAVACHOL) 10 MG tablet TAKE ONE TABLET DAILY   No facility-administered encounter medications on file as of 09/25/2020.    Review of Systems  Unable to perform ROS: Dementia   Immunization History  Administered Date(s) Administered   Influenza Split 10/25/2011   Influenza Whole 02/08/2005, 11/08/2008, 11/15/2009, 10/20/2010   Influenza, High Dose Seasonal PF 02/12/2014, 12/19/2015, 12/25/2017, 12/06/2018, 11/30/2019   Influenza-Unspecified 10/10/2014, 12/12/2016, 01/22/2018   Moderna SARS-COV2 Booster Vaccination 07/13/2020   Moderna Sars-Covid-2 Vaccination 02/20/2019, 03/25/2019, 12/20/2019   Pneumococcal Conjugate-13 03/08/2013   Pneumococcal Polysaccharide-23 05/03/2007   Td 02/09/2000   Tdap 10/20/2010   Zoster Recombinat (Shingrix) 04/25/2020, 06/25/2020   Pertinent  Health Maintenance Due  Topic Date Due   INFLUENZA VACCINE  09/08/2020   DEXA SCAN  Completed   PNA vac Low Risk Adult  Completed   Fall Risk  02/27/2020 01/09/2020 09/12/2019 03/28/2019 02/28/2019  Falls in the past year? 0 0 0 0 0  Comment - - - - -  Number falls in past yr: 0 0 0 0 0  Injury with Fall? 0 0 0 0 0   Functional Status Survey:    Vitals:   09/25/20 1133  BP: 126/88  Pulse: 96  Resp: 16  Temp: 97.7 F (36.5 C)  SpO2: 96%  Weight: 129 lb 6.4 oz (58.7 kg)  Height: '5\' 3"'$  (1.6 m)   Body mass index is 22.92 kg/m. Physical Exam Vitals and nursing note reviewed.  Constitutional:      General: She is not in acute distress.    Appearance: She is not diaphoretic.  HENT:     Head: Normocephalic and atraumatic.     Nose: Nose normal.     Mouth/Throat:     Mouth: Mucous membranes are moist.     Pharynx: Oropharynx is clear.  Eyes:     Conjunctiva/sclera: Conjunctivae  normal.     Pupils: Pupils are equal, round, and reactive to light.  Neck:     Vascular: No JVD.  Cardiovascular:     Rate and Rhythm: Normal rate and regular rhythm.     Heart sounds: No murmur heard. Pulmonary:     Effort: Pulmonary effort is normal. No respiratory distress.     Breath sounds: Normal breath sounds. No wheezing.  Abdominal:     General: Abdomen is flat. Bowel sounds are normal. There is no distension.     Palpations: Abdomen is soft.  Musculoskeletal:     Right lower leg: No edema.     Left lower leg: No edema.  Skin:    General: Skin is warm and dry.  Neurological:     Mental Status: She is alert. Mental status is at baseline.  Psychiatric:        Mood and  Affect: Mood normal.    Labs reviewed: Recent Labs    02/17/20 1322 02/17/20 1344 09/03/20 0000  NA 141 143 145  K 3.9 3.7 4.4  CL 106 107 105  CO2 23  --  26*  GLUCOSE 140* 132*  --   BUN 16 19 22*  CREATININE 0.78 0.70 0.9  CALCIUM 9.4  --  9.6   Recent Labs    02/17/20 1322 09/03/20 0000  AST 31 27  ALT 31 15  ALKPHOS 87 104  BILITOT <0.1*  --   PROT 7.8  --   ALBUMIN 4.5 4.4   Recent Labs    02/17/20 1322 02/17/20 1344 09/03/20 0000  WBC 6.0  --  5.3  NEUTROABS 4.5  --   --   HGB 14.7 16.7* 13.1  HCT 47.7* 49.0* 39  MCV 87.7  --   --   PLT 255  --  397   Lab Results  Component Value Date   TSH 1.77 04/11/2017   Lab Results  Component Value Date   HGBA1C 5.7 (H) 02/18/2020   Lab Results  Component Value Date   CHOL 185 09/03/2020   HDL 52 09/03/2020   LDLCALC 114 09/03/2020   LDLDIRECT 165.1 03/08/2013   TRIG 118 09/03/2020   CHOLHDL 3.0 02/18/2020    Significant Diagnostic Results in last 30 days:  No results found.  Assessment/Plan  1. DNR (do not resuscitate) Updated order  - Do not attempt resuscitation (DNR)  2. Major neurocognitive disorder, due to another medical condition, without behavioral disturbance, moderate (HCC) Moderate stage of dementia  but remains ambulatory and pleasant. Continues to benefit from the memory care setting.   3. Cerebral amyloid angiopathy (CODE) Led to #2 On baby aspirin prior hx of CVA  4. Pernicious anemia Lab Results  Component Value Date   HGB 13.1 09/03/2020   Lab Results  Component Value Date   VITAMINB12 558 03/07/2018    Continue B12 inj monthly  5. Mixed hyperlipidemia Lab Results  Component Value Date   LDLCALC 114 09/03/2020  Continue pravachol 10 mg qd    6. Panic attacks Nurse reports this is not an issue currently Continue ativan 0.5 mg bid and prn  Continue Remeron 15 mg qhs  Family/ staff Communication: nurse  Labs/tests ordered:  NA

## 2020-09-26 ENCOUNTER — Encounter: Payer: Self-pay | Admitting: Adult Health

## 2020-10-01 ENCOUNTER — Encounter: Payer: Self-pay | Admitting: Adult Health

## 2020-10-01 ENCOUNTER — Ambulatory Visit (INDEPENDENT_AMBULATORY_CARE_PROVIDER_SITE_OTHER): Payer: Medicare Other | Admitting: Adult Health

## 2020-10-01 VITALS — BP 126/83 | HR 54 | Ht 61.0 in | Wt 133.0 lb

## 2020-10-01 DIAGNOSIS — I639 Cerebral infarction, unspecified: Secondary | ICD-10-CM

## 2020-10-01 DIAGNOSIS — I69321 Dysphasia following cerebral infarction: Secondary | ICD-10-CM

## 2020-10-01 DIAGNOSIS — R413 Other amnesia: Secondary | ICD-10-CM | POA: Diagnosis not present

## 2020-10-01 NOTE — Progress Notes (Addendum)
PATIENT: Carmen [name] DOB: 05-10-1940  REASON FOR VISIT: follow up HISTORY FROM: patient PRIMARY NEUROLOGIST: Dr. Rexene Alberts  HISTORY OF PRESENT ILLNESS: Today 10/01/20:  Ms. Carmen [name] is a 80 year old female with a history of stroke with residual aphasia.  She returns today for follow-up.  Overall the patient feels that she has remained relatively stable.  She resides at [facility].  She is able to complete all ADLs independently.  She does eat all of her meals at the cafeteria there.  Son noticed some trouble with her short-term memory.  The patient refuses memory test today.  Stroke risk factors are being managed by her PCP.  She returns today for evaluation.    HISTORY 04/03/20: (copied from Dr. Guadelupe Sabin note) She reports very little of her recent history.  She is able to answer simple questions appropriately.  She is complaining of left heel pain which started yesterday.  Her son provides her history.  He reports that she has been fairly stable.  She has short-term memory loss and some residual word finding difficulty since her stroke.  She has had visual field restrictions especially when somebody stands on her right.  She has not had a recent check with an ophthalmologist.  She is getting occupational therapy at [facility].  Dr. [name] has prescribed Nuedexta to help with spells of uncontrollable crying and sobbing.  She does not have uncontrollable laughter or high moods.  Her anxiety has increased over the past months.   The patient's allergies, current medications, family history, past medical history, past social history, past surgical history and problem list were reviewed and updated as appropriate.    REVIEW OF SYSTEMS: Out of a complete 14 system review of symptoms, the patient complains only of the following symptoms, and all other reviewed systems are negative.  ALLERGIES: Allergies  Allergen Reactions   Sulfonamide Derivatives Other (See Comments)    Doesn't remember     HOME MEDICATIONS: Outpatient Medications Prior to Visit  Medication Sig Dispense Refill   aspirin EC 81 MG tablet Take 81 mg by mouth daily.     cyanocobalamin (,VITAMIN B-12,) 1000 MCG/ML injection INJECT 52m MONTHLY 30 mL 1   Dextromethorphan-quiNIDine (NUEDEXTA) 20-10 MG capsule Take 1 capsule by mouth 2 (two) times daily.     docusate sodium (COLACE) 100 MG capsule Take 1 capsule (100 mg total) by mouth 2 (two) times daily. 10 capsule 0   LORazepam (ATIVAN) 0.5 MG tablet Take 1 tablet (0.5 mg total) by mouth daily as needed for anxiety. 30 tablet 3   LORazepam (ATIVAN) 0.5 MG tablet Take 1 tablet (0.5 mg total) by mouth 2 (two) times daily. 60 tablet 3   LUMIGAN 0.01 % SOLN Place 1 drop into both eyes daily.      mirtazapine (REMERON) 15 MG tablet Take 1 tablet (15 mg total) by mouth at bedtime. 30 tablet 5   ondansetron (ZOFRAN) 4 MG tablet Take 4 mg by mouth 4 (four) times daily as needed.     pravastatin (PRAVACHOL) 10 MG tablet TAKE ONE TABLET DAILY 90 tablet 3   No facility-administered medications prior to visit.    PAST MEDICAL HISTORY: Past Medical History:  Diagnosis Date   Anxiety    [facility]    Bleb x 4    Cerebral amyloid angiopathy (CODE) 12/09/2018   Depression    [facility]    Dermatitis due to solar radiation    Dyshidrosis 05/03/2007  Fuchs' corneal dystrophy    Per [place] New Patient Packet    Glaucoma    Heart murmur    Hyperlipidemia    Per [facility] New Patient Packet    Malignant melanoma ([place])    Per [place] New Patient Packet    Osteopenia    Pernicious anemia    Per [place] New Patient Packet    Tinnitus     PAST SURGICAL HISTORY: Past Surgical History:  Procedure Laterality Date   CORNEAL TRANSPLANT     Dr.Carlson-D[facility], Per [place] New Patient Packet    MASTECTOMY     PLACEMENT OF BREAST IMPLANTS      FAMILY HISTORY: Family History  Problem Relation Age of Onset   Breast cancer Mother    Stroke Other    Heart  disease Other    Breast cancer Other    Dysphagia Neg Hx     SOCIAL HISTORY: Social History   Socioeconomic History   Marital status: Widowed    Spouse name: Not on file   Number of children: Not on file   Years of education: Not on file   Highest education level: Not on file  Occupational History    Employer: [employer]  Tobacco Use   Smoking status: Never   Smokeless tobacco: Never  Vaping Use   Vaping Use: Never used  Substance and Sexual Activity   Alcohol use: Yes    Comment: 1 drink weekly per [facility] New Patient Packet 12/05/2018    Drug use: Never   Sexual activity: Not Currently  Other Topics Concern   Not on file  Social History Narrative   Per [facility] New Patient Packet abstracted 12/05/2018:   Diet: Regular       Caffeine: Occasionally       Married, if yes what year: Widow, married 1965      Do you live in a house, apartment, assisted living, [place], trailer, ect: [place] resident at [facility], 1 stories, 1 person      Pets: No      Current/Past profession: Buyer, retail, Marine scientist    Exercise:Yes, 3 x weekly          Living Will: Yes   DNR: Left blank    POA/HPOA: Left blank       Functional Status:   Do you have difficulty bathing or dressing yourself? No   Do you have difficulty preparing food or eating? No   Do you have difficulty managing your medications? No answer selected, notation- pill box filled by daughter   Do you have difficulty managing your finances? Yes, patients son handles her finances    Do you have difficulty affording your medications? No   Social Determinants of Radio broadcast assistant Strain: Not on file  Food Insecurity: Not on file  Transportation Needs: Not on file  Physical Activity: Not on file  Stress: Not on file  Social Connections: Not on file  Intimate Partner Violence: Not on file      PHYSICAL EXAM  Vitals:   10/01/20 1032  BP: 126/83  Pulse: (!) 54  Weight: 133 lb (60.3 kg)   Height: '5\' 1"'$  (1.549 m)   Body mass index is 25.13 kg/m.  Generalized: Well developed, in no acute distress   Neurological examination  Mentation: Alert oriented to time, place, history taking. Follows all commands.  Expressive aphasia noted Cranial nerve II-XII: Pupils were equal round reactive to light. Extraocular movements were full, visual field were full on  confrontational test. Facial sensation and strength were normal. Uvula tongue midline. Head turning and shoulder shrug  were normal and symmetric. Motor: The motor testing reveals 5 over 5 strength of all 4 extremities. Good symmetric motor tone is noted throughout.  Sensory: Sensory testing is intact to soft touch on all 4 extremities. No evidence of extinction is noted.  Coordination: Cerebellar testing reveals good finger-nose-finger and heel-to-shin bilaterally.  Gait and station: Gait is normal. Reflexes: Deep tendon reflexes are symmetric and normal bilaterally.   DIAGNOSTIC DATA (LABS, IMAGING, TESTING) - I reviewed patient records, labs, notes, testing and imaging myself where available.  Lab Results  Component Value Date   WBC 5.3 09/03/2020   HGB 13.1 09/03/2020   HCT 39 09/03/2020   MCV 87.7 02/17/2020   PLT 397 09/03/2020      Component Value Date/Time   NA 145 09/03/2020 0000   K 4.4 09/03/2020 0000   CL 105 09/03/2020 0000   CO2 26 (A) 09/03/2020 0000   GLUCOSE 132 (H) 02/17/2020 1344   GLUCOSE 96 01/19/2006 0904   BUN 22 (A) 09/03/2020 0000   CREATININE 0.9 09/03/2020 0000   CREATININE 0.70 02/17/2020 1344   CALCIUM 9.6 09/03/2020 0000   PROT 7.8 02/17/2020 1322   ALBUMIN 4.4 09/03/2020 0000   AST 27 09/03/2020 0000   ALT 15 09/03/2020 0000   ALKPHOS 104 09/03/2020 0000   BILITOT <0.1 (L) 02/17/2020 1322   GFRNONAA >60 02/17/2020 1322   GFRAA 81 (L) 03/10/2013 1852   Lab Results  Component Value Date   CHOL 185 09/03/2020   HDL 52 09/03/2020   LDLCALC 114 09/03/2020   LDLDIRECT 165.1  03/08/2013   TRIG 118 09/03/2020   CHOLHDL 3.0 02/18/2020   Lab Results  Component Value Date   HGBA1C 5.7 (H) 02/18/2020   Lab Results  Component Value Date   [id number] 03/07/2018   Lab Results  Component Value Date   TSH 1.77 04/11/2017      ASSESSMENT AND PLAN 80 y.o. year old female  has a past medical history of Anxiety, Bleb x 4, Cerebral amyloid angiopathy (CODE) (12/09/2018), Depression, Dermatitis due to solar radiation, Dyshidrosis (05/03/2007), Fuchs' corneal dystrophy, Glaucoma, Heart murmur, Hyperlipidemia, Malignant melanoma ([place]), Osteopenia, Pernicious anemia, and Tinnitus. here with :  1.  History of stroke with residual aphasia  -Continue to monitor stroke risk factors the PCP  2.  Memory disturbance  -Patient refused memory test today -Advised if symptoms worsen they can certainly let us know  Patient will follow-up on an as-needed basis     Ward Givens, MSN, NP-C 10/01/2020, 10:37 AM [facility] [address], [place] [address] [phone]  I reviewed the above note and documentation by the Nurse Practitioner and agree with the history, exam, assessment and plan as outlined above. I was available for consultation. Star Age, MD, PhD [facility])

## 2020-10-20 ENCOUNTER — Encounter: Payer: Self-pay | Admitting: Internal Medicine

## 2020-10-20 ENCOUNTER — Non-Acute Institutional Stay (SKILLED_NURSING_FACILITY): Payer: Medicare Other | Admitting: Internal Medicine

## 2020-10-20 DIAGNOSIS — F410 Panic disorder [episodic paroxysmal anxiety] without agoraphobia: Secondary | ICD-10-CM

## 2020-10-20 DIAGNOSIS — F482 Pseudobulbar affect: Secondary | ICD-10-CM

## 2020-10-20 DIAGNOSIS — E782 Mixed hyperlipidemia: Secondary | ICD-10-CM | POA: Diagnosis not present

## 2020-10-20 DIAGNOSIS — D510 Vitamin B12 deficiency anemia due to intrinsic factor deficiency: Secondary | ICD-10-CM | POA: Diagnosis not present

## 2020-10-20 DIAGNOSIS — I680 Cerebral amyloid angiopathy: Secondary | ICD-10-CM

## 2020-10-20 DIAGNOSIS — F0280 Dementia in other diseases classified elsewhere without behavioral disturbance: Secondary | ICD-10-CM

## 2020-10-20 DIAGNOSIS — G4733 Obstructive sleep apnea (adult) (pediatric): Secondary | ICD-10-CM

## 2020-10-20 DIAGNOSIS — F02B0 Dementia in other diseases classified elsewhere, moderate, without behavioral disturbance, psychotic disturbance, mood disturbance, and anxiety: Secondary | ICD-10-CM

## 2020-10-20 DIAGNOSIS — I639 Cerebral infarction, unspecified: Secondary | ICD-10-CM

## 2020-10-20 NOTE — Progress Notes (Signed)
Location:   [place] Room Number: [id number] Place of Service:  SNF [phone]) Provider:  Veleta Miners MD  Virgie Dad, MD  Patient Care Team: Virgie Dad, MD as PCP - General (Internal Medicine) Martinique, Amy, MD as Consulting Physician (Dermatology) Luberta Mutter, MD as Consulting Physician (Ophthalmology) Star Age, MD as Attending Physician (Neurology)  Extended Emergency Contact Information Primary Emergency Contact: [name] States of [place] Phone: [phone] Mobile Phone: [phone] Relation: Daughter Secondary Emergency Contact: Sharion Dove of [place] Phone: [phone] Work Phone: [phone] Mobile Phone: [phone] Relation: Son  Code Status:  DNR Goals of care: Advanced Directive information Advanced Directives 10/20/2020  Does Patient Have a Medical Advance Directive? Yes  Type of Advance Directive Out of facility DNR (pink MOST or yellow form)  Does patient want to make changes to medical advance directive? No - Patient declined  Copy of [place] in Chart? -  Would patient like information on creating a medical advance directive? -  Pre-existing out of facility DNR order (yellow form or pink MOST form) Yellow form placed in chart (order not valid for inpatient use)     No chief complaint on file.   HPI:  Pt is a 80 y.o. female seen today for medical management of chronic diseases.    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 Did not tolerate Tapering Covid infection in 7/19 Mostly minimal symptoms  Doing well. Weight stable Walks with no issues No  Nursing isues   Past Medical History:  Diagnosis Date   Anxiety    Per [place] New Patient Packet    Bleb x 4    Cerebral amyloid angiopathy (CODE) 12/09/2018   Depression    Per [place] New Patient Packet    Dermatitis due to solar radiation    Dyshidrosis 05/03/2007   Fuchs' corneal dystrophy    Per [facility] New Patient Packet    Glaucoma    Heart murmur    Hyperlipidemia    Per [facility] New Patient Packet    Malignant melanoma ([place])    Per [place] New Patient Packet    Osteopenia    Pernicious anemia    Per [place] New Patient Packet    Tinnitus    Past Surgical History:  Procedure Laterality Date   CORNEAL TRANSPLANT     Dr.Carlson-Duke, Per [facility] New Patient Packet    MASTECTOMY     PLACEMENT OF BREAST IMPLANTS      Allergies  Allergen Reactions   Sulfonamide Derivatives Other (See Comments)    Doesn't remember    Allergies as of 10/20/2020       Reactions   Sulfonamide Derivatives Other (See Comments)   Doesn't remember        Medication List        Accurate as of October 20, 2020 11:59 PM. If you have any questions, ask your nurse or doctor.          aspirin EC 81 MG tablet Take 81 mg by mouth daily.   cyanocobalamin 1000 MCG/ML injection Commonly known as: (VITAMIN B-12) INJECT 35m MONTHLY   Dextromethorphan-quiNIDine 20-10 MG capsule Commonly known as: NUEDEXTA Take 1 capsule by mouth every 12 (  twelve) hours.   docusate sodium 100 MG capsule Commonly known as: Colace Take 1 capsule (100 mg total) by mouth 2 (two) times daily.   LORazepam 0.5 MG tablet Commonly known as: ATIVAN Take 1 tablet (0.5 mg total) by mouth daily as needed for anxiety.   LORazepam 0.5 MG tablet Commonly known as: ATIVAN Take 1 tablet (0.5 mg total) by mouth 2 (two) times daily.   Lumigan 0.01 % Soln Generic drug: bimatoprost Place 1 drop into both eyes daily.   mirtazapine 15 MG tablet Commonly known as: REMERON Take 1 tablet (15 mg total) by mouth at bedtime.    ondansetron 4 MG tablet Commonly known as: ZOFRAN Take 4 mg by mouth 4 (four) times daily as needed.   pravastatin 10 MG tablet Commonly known as: PRAVACHOL TAKE ONE TABLET DAILY        Review of Systems  Unable to perform ROS: Dementia   Immunization History  Administered Date(s) Administered   Influenza Split 10/25/2011   Influenza Whole 02/08/2005, 11/08/2008, 11/15/2009, 10/20/2010   Influenza, High Dose Seasonal PF 02/12/2014, 12/19/2015, 12/25/2017, 12/06/2018, 11/30/2019   Influenza-Unspecified 10/10/2014, 12/12/2016, 01/22/2018   Moderna SARS-COV2 Booster Vaccination 07/13/2020   Moderna Sars-Covid-2 Vaccination 02/20/2019, 03/25/2019, 12/20/2019   Pneumococcal Conjugate-13 03/08/2013   Pneumococcal Polysaccharide-23 05/03/2007   Td 02/09/2000   Tdap 10/20/2010   Zoster Recombinat (Shingrix) 04/25/2020, 06/25/2020   Pertinent  Health Maintenance Due  Topic Date Due   INFLUENZA VACCINE  09/08/2020   DEXA SCAN  Completed   PNA vac Low Risk Adult  Completed   Fall Risk  09/26/2020 02/27/2020 01/09/2020 09/12/2019 03/28/2019  Falls in the past year? 1 0 0 0 0  Comment - - - - -  Number falls in past yr: 0 0 0 0 0  Injury with Fall? 1 0 0 0 0  Risk for fall due to : History of fall(s) - - - -  Follow up Falls evaluation completed - - - -   Functional Status Survey:    Vitals:   10/20/20 1114  BP: 126/88  Pulse: 96  Resp: 16  Temp: 97.7 F (36.5 C)  SpO2: 96%  Weight: 132 lb 6.4 oz (60.1 kg)  Height: '5\' 1"'$  (1.549 m)   Body mass index is 25.02 kg/m. Physical Exam Constitutional:  Well-developed and well-nourished.  HENT:  Head: Normocephalic.  Mouth/Throat: Oropharynx is clear and moist.  Eyes: Pupils are equal, round, and reactive to light.  Neck: Neck supple.  Cardiovascular: Normal rate and normal heart sounds.  No murmur heard. Pulmonary/Chest: Effort normal and breath sounds normal. No respiratory distress. No wheezes. She has no rales.   Abdominal: Soft. Bowel sounds are normal. No distension. There is no tenderness. There is no rebound.  Musculoskeletal: No edema.  Lymphadenopathy: none Neurological:  No Focal Deficits Walks  holding hands Respond Appropriately Has mild Aphasia Skin: Skin is warm and dry.  Psychiatric: Normal mood and affect. Behavior is normal. Thought content normal.   Labs reviewed: Recent Labs    02/17/20 1322 02/17/20 1344 09/03/20 0000  NA 141 143 145  K 3.9 3.7 4.4  CL 106 107 105  CO2 23  --  26*  GLUCOSE 140* 132*  --   BUN 16 19 22*  CREATININE 0.78 0.70 0.9  CALCIUM 9.4  --  9.6   Recent Labs    02/17/20 1322 09/03/20 0000  AST 31 27  ALT 31 15  ALKPHOS 87 104  BILITOT <0.1*  --  PROT 7.8  --   ALBUMIN 4.5 4.4   Recent Labs    02/17/20 1322 02/17/20 1344 09/03/20 0000  WBC 6.0  --  5.3  NEUTROABS 4.5  --   --   HGB 14.7 16.7* 13.1  HCT 47.7* 49.0* 39  MCV 87.7  --   --   PLT 255  --  397   Lab Results  Component Value Date   TSH 1.77 04/11/2017   Lab Results  Component Value Date   HGBA1C 5.7 (H) 02/18/2020   Lab Results  Component Value Date   CHOL 185 09/03/2020   HDL 52 09/03/2020   LDLCALC 114 09/03/2020   LDLDIRECT 165.1 03/08/2013   TRIG 118 09/03/2020   CHOLHDL 3.0 02/18/2020    Significant Diagnostic Results in last 30 days:  No results found.  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

## 2020-11-14 ENCOUNTER — Non-Acute Institutional Stay (SKILLED_NURSING_FACILITY): Payer: Medicare Other | Admitting: Adult Health

## 2020-11-14 ENCOUNTER — Encounter: Payer: Self-pay | Admitting: Adult Health

## 2020-11-14 DIAGNOSIS — K5901 Slow transit constipation: Secondary | ICD-10-CM

## 2020-11-14 DIAGNOSIS — F02B0 Dementia in other diseases classified elsewhere, moderate, without behavioral disturbance, psychotic disturbance, mood disturbance, and anxiety: Secondary | ICD-10-CM | POA: Diagnosis not present

## 2020-11-14 DIAGNOSIS — F482 Pseudobulbar affect: Secondary | ICD-10-CM

## 2020-11-14 DIAGNOSIS — D510 Vitamin B12 deficiency anemia due to intrinsic factor deficiency: Secondary | ICD-10-CM

## 2020-11-14 DIAGNOSIS — F410 Panic disorder [episodic paroxysmal anxiety] without agoraphobia: Secondary | ICD-10-CM | POA: Diagnosis not present

## 2020-11-14 DIAGNOSIS — E782 Mixed hyperlipidemia: Secondary | ICD-10-CM

## 2020-11-14 DIAGNOSIS — K5900 Constipation, unspecified: Secondary | ICD-10-CM | POA: Insufficient documentation

## 2020-11-14 NOTE — Progress Notes (Signed)
Location:   [place] Room Number: 683 Place of Service:  SNF [phone]) Provider:  Royal Hawthorn, NP  Virgie Dad, MD  Patient Care Team: Virgie Dad, MD as PCP - General (Internal Medicine) Martinique, Amy, MD as Consulting Physician (Dermatology) Luberta Mutter, MD as Consulting Physician (Ophthalmology) Star Age, MD as Attending Physician (Neurology)  Extended Emergency Contact Information Primary Emergency Contact: [name] States of [place] Phone: [phone] Mobile Phone: [phone] Relation: Daughter Secondary Emergency Contact: Sharion Dove of [place] Phone: [phone] Work Phone: [phone] Mobile Phone: [phone] Relation: Son  Code Status:  DNR Goals of care: Advanced Directive information Advanced Directives 11/14/2020  Does Patient Have a Medical Advance Directive? Yes  Type of Advance Directive Out of facility DNR (pink MOST or yellow form)  Does patient want to make changes to medical advance directive? No - Patient declined  Copy of [place] in Chart? -  Would patient like information on creating a medical advance directive? -  Pre-existing out of facility DNR order (yellow form or pink MOST form) Yellow form placed in chart (order not valid for inpatient use)     Chief Complaint  Patient presents with   Medical Management of Chronic Issues   Quality Metric Gaps    Flu Shoot, TDAP, #5 covid    HPI:  Pt is a 80 y.o. female seen today for medical management of chronic diseases.   PMH significant for major neurocognitive disorder, anxiety with panic attacks, PBA, constipation, depression, glaucoma, pernicious anemia, osteopenia, HLD, CVA.   She remains ambulatory and pleasant. Has short periods of crying but is redirected. Has a caregiver to help provide support. No issues with agitation, sleep, appetite, or aggression.   No acute  concerns Weight is stable Wt Readings from Last 3 Encounters:  11/14/20 132 lb 9.6 oz (60.1 kg)  10/20/20 132 lb 6.4 oz (60.1 kg)  10/01/20 133 lb (60.3 kg)     Past Medical History:  Diagnosis Date   Anxiety    Per [place] New Patient Packet    Bleb x 4    Cerebral amyloid angiopathy (CODE) 12/09/2018   Depression    Per [place] New Patient Packet    Dermatitis due to solar radiation    Dyshidrosis 05/03/2007   Fuchs' corneal dystrophy    Per [facility] New Patient Packet    Glaucoma    Heart murmur    Hyperlipidemia    Per [facility] New Patient Packet    Malignant melanoma ([place])    Per [place] New Patient Packet    Osteopenia    Pernicious anemia    Per [place] New Patient Packet    Tinnitus    Past Surgical History:  Procedure Laterality Date   CORNEAL TRANSPLANT     Dr.Carlson-[facility], Per [facility] New Patient Packet    MASTECTOMY     PLACEMENT OF BREAST IMPLANTS      Allergies  Allergen Reactions   Sulfonamide Derivatives Other (See Comments)    Doesn't remember    Allergies as of 11/14/2020       Reactions   Sulfonamide Derivatives Other (See Comments)   Doesn't remember        Medication List        Accurate as of November 14, 2020  9:59 AM. If you have any questions, ask your nurse or doctor.          aspirin EC 81 MG tablet Take  81 mg by mouth daily.   cyanocobalamin 1000 MCG/ML injection Commonly known as: (VITAMIN B-12) INJECT 5mL MONTHLY   Dextromethorphan-quiNIDine 20-10 MG capsule Commonly known as: NUEDEXTA Take 1 capsule by mouth every 12 (twelve) hours.   docusate sodium 100 MG capsule Commonly known as: COLACE Take 100 mg by mouth daily.   LORazepam 0.5 MG tablet Commonly known as: ATIVAN Take 1 tablet (0.5 mg total) by mouth daily as needed for anxiety.   LORazepam 0.5 MG tablet Commonly known as: ATIVAN Take 1 tablet (0.5 mg total) by mouth 2 (two) times daily.   Lumigan 0.01 % Soln Generic drug: bimatoprost Place 1 drop into both eyes daily.    mirtazapine 15 MG tablet Commonly known as: REMERON Take 1 tablet (15 mg total) by mouth at bedtime.   ondansetron 4 MG tablet Commonly known as: ZOFRAN Take 4 mg by mouth 4 (four) times daily as needed.   pravastatin 20 MG tablet Commonly known as: PRAVACHOL Take 20 mg by mouth at bedtime.        Review of Systems  Constitutional:  Negative for chills, diaphoresis and fever.  Respiratory:  Negative for cough and wheezing.   Cardiovascular:  Negative for chest pain and leg swelling.  Gastrointestinal:  Negative for abdominal pain, constipation, diarrhea, nausea and vomiting.  Genitourinary:  Negative for dysuria and urgency.  Musculoskeletal:  Positive for gait problem. Negative for back pain, myalgias and neck pain.  Skin:  Negative for rash.  Neurological:  Negative for weakness (chronic left sided weakness).  Psychiatric/Behavioral:  Positive for confusion. Negative for sleep disturbance. The patient is nervous/anxious.    Immunization History  Administered Date(s) Administered   Influenza Split 10/25/2011   Influenza Whole 02/08/2005, 11/08/2008, 11/15/2009, 10/20/2010   Influenza, High Dose Seasonal PF 02/12/2014, 12/19/2015, 12/25/2017, 12/06/2018, 11/30/2019   Influenza-Unspecified 10/10/2014, 12/12/2016, 01/22/2018   Moderna SARS-COV2 Booster Vaccination 07/13/2020   Moderna Sars-Covid-2 Vaccination 02/20/2019, 03/25/2019, 12/20/2019   Pneumococcal Conjugate-13 03/08/2013   Pneumococcal Polysaccharide-23 05/03/2007   Td 02/09/2000   Tdap 10/20/2010   Zoster Recombinat (Shingrix) 04/25/2020, 06/25/2020   Pertinent  Health Maintenance Due  Topic Date Due   INFLUENZA VACCINE  09/08/2020   DEXA SCAN  Completed   Fall Risk  09/26/2020 02/27/2020 01/09/2020 09/12/2019 03/28/2019  Falls in the past year? 1 0 0 0 0  Comment - - - - -  Number falls in past yr: 0 0 0 0 0  Injury with Fall? 1 0 0 0 0  Risk for fall due to : History of fall(s) - - - -  Follow up Falls  evaluation completed - - - -   Functional Status Survey:    Vitals:   11/14/20 0953  BP: 133/78  Pulse: 83  Resp: 18  Temp: (!) 97.5 F (36.4 C)  SpO2: 94%  Weight: 132 lb 9.6 oz (60.1 kg)  Height: 5\' 1"  (1.549 m)   Body mass index is 25.05 kg/m. Wt Readings from Last 3 Encounters:  11/14/20 132 lb 9.6 oz (60.1 kg)  10/20/20 132 lb 6.4 oz (60.1 kg)  10/01/20 133 lb (60.3 kg)    Physical Exam Vitals and nursing note reviewed.  Constitutional:      General: She is not in acute distress.    Appearance: She is not diaphoretic.  HENT:     Head: Normocephalic and atraumatic.     Nose: Nose normal. No congestion.     Mouth/Throat:     Mouth: Mucous membranes are  moist.     Pharynx: Oropharynx is clear.  Eyes:     General:        Right eye: No discharge.        Left eye: No discharge.     Conjunctiva/sclera: Conjunctivae normal.     Pupils: Pupils are equal, round, and reactive to light.  Neck:     Vascular: No JVD.  Cardiovascular:     Rate and Rhythm: Normal rate and regular rhythm.     Heart sounds: No murmur heard. Pulmonary:     Effort: Pulmonary effort is normal. No respiratory distress.     Breath sounds: Normal breath sounds. No wheezing.  Abdominal:     General: Bowel sounds are normal. There is no distension.     Palpations: Abdomen is soft.  Musculoskeletal:        General: No swelling, tenderness, deformity or signs of injury.     Cervical back: No rigidity or tenderness.     Right lower leg: No edema.     Left lower leg: No edema.  Lymphadenopathy:     Cervical: No cervical adenopathy.  Skin:    General: Skin is warm and dry.  Neurological:     Mental Status: She is alert. Mental status is at baseline.     Comments: Oriented to self. Able to f/c.  LUE 4/5 RUE 5/5  Psychiatric:        Mood and Affect: Mood normal.    Labs reviewed: Recent Labs    02/17/20 1322 02/17/20 1344 09/03/20 0000  NA 141 143 145  K 3.9 3.7 4.4  CL 106 107 105   CO2 23  --  26*  GLUCOSE 140* 132*  --   BUN 16 19 22*  CREATININE 0.78 0.70 0.9  CALCIUM 9.4  --  9.6   Recent Labs    02/17/20 1322 09/03/20 0000  AST 31 27  ALT 31 15  ALKPHOS 87 104  BILITOT <0.1*  --   PROT 7.8  --   ALBUMIN 4.5 4.4   Recent Labs    02/17/20 1322 02/17/20 1344 09/03/20 0000  WBC 6.0  --  5.3  NEUTROABS 4.5  --   --   HGB 14.7 16.7* 13.1  HCT 47.7* 49.0* 39  MCV 87.7  --   --   PLT 255  --  397   Lab Results  Component Value Date   TSH 1.77 04/11/2017   Lab Results  Component Value Date   HGBA1C 5.7 (H) 02/18/2020   Lab Results  Component Value Date   CHOL 185 09/03/2020   HDL 52 09/03/2020   LDLCALC 114 09/03/2020   LDLDIRECT 165.1 03/08/2013   TRIG 118 09/03/2020   CHOLHDL 3.0 02/18/2020    Significant Diagnostic Results in last 30 days:  No results found.  Assessment/Plan  1. Major neurocognitive disorder, due to another medical condition, without behavioral disturbance, moderate Remains ambulatory and able to communicate  Not able to complete MMSE Continues to benefit from the memory care setting.   2. Panic attacks Has periods of crying and would likely get worse if we tapered ativan. Would continue 0.5 mg bid.   3. Mixed hyperlipidemia Lab Results  Component Value Date   LDLCALC 114 09/03/2020  Pravastatin increase 10/24/20 to 20mg  with no s/e   4. Slow transit constipation Continue Colace 100 mg qd   5. PBA (pseudobulbar affect) Continues to benefit from Nuedexta   6. Pernicious anemia Lab Results  Component Value Date   HGB 13.1 09/03/2020   Continue B12 injection monthly   Family/ staff Communication: nurse and caregiver  Labs/tests ordered:  NA

## 2020-11-24 ENCOUNTER — Encounter: Payer: Self-pay | Admitting: Internal Medicine

## 2020-11-24 ENCOUNTER — Non-Acute Institutional Stay (SKILLED_NURSING_FACILITY): Payer: Medicare Other | Admitting: Internal Medicine

## 2020-11-24 ENCOUNTER — Telehealth: Payer: Self-pay | Admitting: Adult Health

## 2020-11-24 DIAGNOSIS — F02B0 Dementia in other diseases classified elsewhere, moderate, without behavioral disturbance, psychotic disturbance, mood disturbance, and anxiety: Secondary | ICD-10-CM | POA: Diagnosis not present

## 2020-11-24 DIAGNOSIS — F482 Pseudobulbar affect: Secondary | ICD-10-CM | POA: Diagnosis not present

## 2020-11-24 DIAGNOSIS — I680 Cerebral amyloid angiopathy: Secondary | ICD-10-CM | POA: Diagnosis not present

## 2020-11-24 DIAGNOSIS — R531 Weakness: Secondary | ICD-10-CM

## 2020-11-24 DIAGNOSIS — E782 Mixed hyperlipidemia: Secondary | ICD-10-CM

## 2020-11-24 NOTE — Progress Notes (Signed)
Location:   [facility] Room Number: 010 Place of Service:  SNF [phone]) Provider:  Veleta Miners MD  Virgie Dad, MD  Patient Care Team: Virgie Dad, MD as PCP - General (Internal Medicine) Martinique, Amy, MD as Consulting Physician (Dermatology) Luberta Mutter, MD as Consulting Physician (Ophthalmology) Star Age, MD as Attending Physician (Neurology)  Extended Emergency Contact Information Primary Emergency Contact: [name] [place] of [place] Phone: [phone] Mobile Phone: [phone] Relation: Daughter Secondary Emergency Contact: Sharion Dove of [place] Phone: [phone] Work Phone: [phone] Mobile Phone: [phone] Relation: Son  Code Status:  DNR Goals of care: Advanced Directive information Advanced Directives 11/24/2020  Does Patient Have a Medical Advance Directive? Yes  Type of Advance Directive Out of facility DNR (pink MOST or yellow form)  Does patient want to make changes to medical advance directive? No - Patient declined  Copy of [place] in Chart? -  Would patient like information on creating a medical advance directive? -  Pre-existing out of facility DNR order (yellow form or pink MOST form) Yellow form placed in chart (order not valid for inpatient use)     Chief Complaint  Patient presents with   Acute Visit    Stroke like symptoms    HPI:  Pt is a 80 y.o. female seen today for an acute visit for Right sided weakness and not walking well  Patient has h/o Acute ischemic Stroke in 1/22 with Residual Aphasia H/o Amyloid Angiopathy Neurocognitive Impairnment H/o Microhemmorrhage in Cerebellum, Depression, HLD, Pernicious anemia on B12 injections and Anxiety H/o OSA not using CPAP Patient was admitted in Memory unit in 02/22 H/o PBA Symptoms with Crying Controlled on Nudexta. Did not tolerate Tapering Covid infection in 7/19 Mostly  minimal symptoms  Today was noticed by Nurses to be dragging her Right leg. Not walking and stopping  Also Bending to right side. This is all new for her as usually she walks with no assist Also was noticed ot be have slurred speech but was back to normal for me No Other issues Vitals stable No Falls Eating well  Past Medical History:  Diagnosis Date   Anxiety    Per [facility] New Patient Packet    Bleb x 4    Cerebral amyloid angiopathy (CODE) 12/09/2018   Depression    Per [facility] [facility] New Patient Packet    Dermatitis due to solar radiation    Dyshidrosis 05/03/2007   Fuchs' corneal dystrophy    Per [facility] New Patient Packet    Glaucoma    Heart murmur    Hyperlipidemia    Per [facility] New Patient Packet    Malignant melanoma ([place])    Per [facility] New Patient Packet    Osteopenia    Pernicious anemia    Per [facility] New Patient Packet    Tinnitus    Past Surgical History:  Procedure Laterality Date   CORNEAL TRANSPLANT     Dr.Carlson-[facility], Per [facility] New Patient Packet    MASTECTOMY     PLACEMENT OF BREAST IMPLANTS      Allergies  Allergen Reactions   Sulfonamide Derivatives Other (See Comments)    Doesn't remember    Allergies as of 11/24/2020       Reactions   Sulfonamide Derivatives Other (See Comments)   Doesn't remember        Medication List        Accurate as of November 24, 2020 11:11  AM. If you have any questions, ask your nurse or doctor.          aspirin EC 81 MG tablet Take 81 mg by mouth daily.   cyanocobalamin 1000 MCG/ML injection Commonly known as: (VITAMIN B-12) INJECT 52mL MONTHLY   Dextromethorphan-quiNIDine 20-10 MG capsule Commonly known as: NUEDEXTA Take 1 capsule by mouth every 12 (twelve) hours.   docusate sodium 100 MG capsule Commonly known as: COLACE Take 100 mg by mouth daily.   LORazepam 0.5 MG tablet Commonly known as: ATIVAN Take 1 tablet (0.5 mg total) by mouth daily as needed for anxiety.   LORazepam 0.5 MG tablet Commonly known as:  ATIVAN Take 1 tablet (0.5 mg total) by mouth 2 (two) times daily.   Lumigan 0.01 % Soln Generic drug: bimatoprost Place 1 drop into both eyes daily.   mirtazapine 15 MG tablet Commonly known as: REMERON Take 1 tablet (15 mg total) by mouth at bedtime.   ondansetron 4 MG tablet Commonly known as: ZOFRAN Take 4 mg by mouth 4 (four) times daily as needed.   pravastatin 20 MG tablet Commonly known as: PRAVACHOL Take 20 mg by mouth at bedtime.        Review of Systems  Unable to perform ROS: Dementia   Immunization History  Administered Date(s) Administered   Influenza Inj Mdck Quad Pf 11/12/2020   Influenza Split 10/25/2011   Influenza Whole 02/08/2005, 11/08/2008, 11/15/2009, 10/20/2010   Influenza, High Dose Seasonal PF 02/12/2014, 12/19/2015, 12/25/2017, 12/06/2018, 11/30/2019   Influenza-Unspecified 10/10/2014, 12/12/2016, 01/22/2018   Moderna SARS-COV2 Booster Vaccination 07/13/2020   Moderna Sars-Covid-2 Vaccination 02/20/2019, 03/25/2019, 12/20/2019   Pneumococcal Conjugate-13 03/08/2013   Pneumococcal Polysaccharide-23 05/03/2007   Td 02/09/2000   Tdap 10/20/2010, 11/13/2020   Zoster Recombinat (Shingrix) 04/25/2020, 06/25/2020   Pertinent  Health Maintenance Due  Topic Date Due   INFLUENZA VACCINE  Completed   DEXA SCAN  Completed   Fall Risk  09/26/2020 02/27/2020 01/09/2020 09/12/2019 03/28/2019  Falls in the past year? 1 0 0 0 0  Comment - - - - -  Number falls in past yr: 0 0 0 0 0  Injury with Fall? 1 0 0 0 0  Risk for fall due to : History of fall(s) - - - -  Follow up Falls evaluation completed - - - -   Functional Status Survey:    Vitals:   11/24/20 1103  BP: (!) 144/98  Pulse: 85  Resp: 20  Temp: (!) 97.3 F (36.3 C)  SpO2: 96%  Weight: 132 lb 9.6 oz (60.1 kg)  Height: 5\' 1"  (1.549 m)   Body mass index is 25.05 kg/m. Physical Exam Vitals reviewed.  Constitutional:      Appearance: Normal appearance.     Comments: No distress  HENT:      Head: Normocephalic.     Nose: Nose normal.     Mouth/Throat:     Mouth: Mucous membranes are moist.     Pharynx: Oropharynx is clear.  Eyes:     Pupils: Pupils are equal, round, and reactive to light.  Cardiovascular:     Rate and Rhythm: Normal rate and regular rhythm.     Pulses: Normal pulses.     Heart sounds: Normal heart sounds.  Pulmonary:     Effort: Pulmonary effort is normal. No respiratory distress.     Breath sounds: Normal breath sounds.  Abdominal:     General: Abdomen is flat. Bowel sounds are normal.  Palpations: Abdomen is soft.  Musculoskeletal:        General: No swelling.     Cervical back: Neck supple.  Skin:    General: Skin is warm.  Neurological:     Mental Status: She is alert.     Comments: Alert Mental status at baseline No Focal deficits noticed but when we stood her she would not walk and hold Nurses hand and drag her Right Leg. Freeze at one place before taking her next step This is new for her No Facial Droop. No Eyelid drooping  Psychiatric:        Mood and Affect: Mood normal.        Thought Content: Thought content normal.    Labs reviewed: Recent Labs    02/17/20 1322 02/17/20 1344 09/03/20 0000  NA 141 143 145  K 3.9 3.7 4.4  CL 106 107 105  CO2 23  --  26*  GLUCOSE 140* 132*  --   BUN 16 19 22*  CREATININE 0.78 0.70 0.9  CALCIUM 9.4  --  9.6   Recent Labs    02/17/20 1322 09/03/20 0000  AST 31 27  ALT 31 15  ALKPHOS 87 104  BILITOT <0.1*  --   PROT 7.8  --   ALBUMIN 4.5 4.4   Recent Labs    02/17/20 1322 02/17/20 1344 09/03/20 0000  WBC 6.0  --  5.3  NEUTROABS 4.5  --   --   HGB 14.7 16.7* 13.1  HCT 47.7* 49.0* 39  MCV 87.7  --   --   PLT 255  --  397   Lab Results  Component Value Date   TSH 1.77 04/11/2017   Lab Results  Component Value Date   HGBA1C 5.7 (H) 02/18/2020   Lab Results  Component Value Date   CHOL 185 09/03/2020   HDL 52 09/03/2020   LDLCALC 114 09/03/2020   LDLDIRECT  165.1 03/08/2013   TRIG 118 09/03/2020   CHOLHDL 3.0 02/18/2020    Significant Diagnostic Results in last 30 days:  No results found.  Assessment/Plan  Right sided weakness with h/o CVA and Cerebral Amyloid Angiopathy ? TIA /Small Bleed or New CVA At this time with her comorbidity family decided to wait before sending her to ED Will continue to monitor her in facility She is already on aspirin and Statin recently increased I wrote for follow up with Neurology .  Also PT/OT and ST to evaluate and treat If worsening symptoms will send to ED     Family/ staff Communication:   Labs/tests ordered:    Total time spent in this patient care encounter was  45_  minutes; greater than 50% of the visit spent counseling patient and staff, reviewing records , Labs and coordinating care for problems addressed at this encounter.

## 2020-11-24 NOTE — Telephone Encounter (Signed)
Pt's son greg called states his mother is a memory care unit at [facility]. States they have noticed a change in her gait and posture. Carmen [name] is requesting a call back.

## 2020-11-25 NOTE — Telephone Encounter (Signed)
Thank you, Bethany. Nothing further needed.

## 2020-11-25 NOTE — Telephone Encounter (Signed)
Spoke with the patient's son, Marya Amsler (on [place]).  He stated that yesterday [facility] called him and reported a significant change in gait and posture.  Patient was noted to be dragging her right foot and bending forward.  They did not call 911 but Dr. Lyndel Safe was able to evaluate her on the same day.  Patient was found to have no focal deficiencies, no weakness on either side. Pt's son states there was an initial report of slurred speech but this was not present when Dr. Lyndel Safe evaluated the patient.  Patient's son states there is questionable TIA.  Patient has been advised by Dr. Lyndel Safe to follow-up with neurology.  The note is available on patient's chart.  I let Marya Amsler know I would give him a call back regarding scheduling an appointment.  He verbalized appreciation.

## 2020-11-25 NOTE — Telephone Encounter (Signed)
For any acute changes that are suspicious for mini stroke i.e. TIA or stroke, we recommend immediate evaluation in the emergency room or to call 911.  Please notify patient's caretaker.   We can schedule for a follow-up for the next available appointment with the nurse practitioner.

## 2020-11-25 NOTE — Telephone Encounter (Addendum)
Spoke with Dr Rexene Alberts and reviewed Dr. Steve Rattler note which stated for patient to proceed to ER for any worsening symptoms, therapies ordered, monitor, and follow-up with neurology, ?stroke, TIA bleed.  Spoke with the patient's son.  He stated he was told by the facility that the patient should see neurology and when he asked if this was emergent he was told "within a few days". He was unsure exactly what this neurology visit would be for as he understands that any acute concerns of stroke/bleed, TIA should be evaluated emergently in the ER and our office is not equipped to do emergent rule out of stroke. He did accept an appointment with the NP Megan for this Thursday, October 20th at 3:00 PM arrival 2:30 PM and felt like this would be the appropriate route at this time but that should they see any indication that she is worsening they would escalate and deem the situation an emergency.  I let him know I would copy Dr. Lyndel Safe on this as well so we are all on the same page. He was very Patent attorney.  I reiterated again that for any concerns that she has had a stroke its really advised she go to the ER for emergent evaluation where they can do all necessary testing fast. He verbalized understanding.

## 2020-11-27 ENCOUNTER — Encounter: Payer: Self-pay | Admitting: Adult Health

## 2020-11-27 ENCOUNTER — Ambulatory Visit (INDEPENDENT_AMBULATORY_CARE_PROVIDER_SITE_OTHER): Payer: Medicare Other | Admitting: Adult Health

## 2020-11-27 VITALS — BP 105/70 | HR 102 | Wt 134.0 lb

## 2020-11-27 DIAGNOSIS — R29898 Other symptoms and signs involving the musculoskeletal system: Secondary | ICD-10-CM

## 2020-11-27 DIAGNOSIS — I680 Cerebral amyloid angiopathy: Secondary | ICD-10-CM

## 2020-11-27 DIAGNOSIS — I639 Cerebral infarction, unspecified: Secondary | ICD-10-CM | POA: Diagnosis not present

## 2020-11-27 NOTE — Progress Notes (Signed)
PATIENT: Carmen [name] DOB: 1940-09-27  REASON FOR VISIT: follow up HISTORY FROM: patient PRIMARY NEUROLOGIST: Dr. Rexene Alberts  HISTORY OF PRESENT ILLNESS: Today 11/27/20:  Carmen [name] is a 80 year old female with a history of stroke and residual aphasia.  She returns today for follow-up.  She is here with her daughter.  Daughter states that on Monday caregiver noticed that she was dragging her right foot intermittently.  And leaning to the right side.  She was evaluated by the physician at her facility.  They also noted that she was dragging the right leg and leaning to the right and needed some assistance with ambulation.  The family opted not to send the patient to the emergency room but rather to follow this conservatively.  The daughter does not feel that her symptoms have gotten any worse.  She states that the nurse at the facility thought that her symptoms had improved slightly.  Daughter denies any other symptoms with the exception that she notes that she is sleepy today.  She returns today for an evaluation.  10/01/20: Carmen [name] is a 80 year old female with a history of stroke with residual aphasia.  She returns today for follow-up.  Overall the patient feels that she has remained relatively stable.  She resides at [facility].  She is able to complete all ADLs independently.  She does eat all of her meals at the cafeteria there.  Son noticed some trouble with her short-term memory.  The patient refuses memory test today.  Stroke risk factors are being managed by her PCP.  She returns today for evaluation.    HISTORY 04/03/20: (copied from Dr. Guadelupe Sabin note) She reports very little of her recent history.  She is able to answer simple questions appropriately.  She is complaining of left heel pain which started yesterday.  Her son provides her history.  He reports that she has been fairly stable.  She has short-term memory loss and some residual word finding difficulty since her stroke.  She  has had visual field restrictions especially when somebody stands on her right.  She has not had a recent check with an ophthalmologist.  She is getting occupational therapy at [facility].  Dr. [name] has prescribed Nuedexta to help with spells of uncontrollable crying and sobbing.  She does not have uncontrollable laughter or high moods.  Her anxiety has increased over the past months.   The patient's allergies, current medications, family history, past medical history, past social history, past surgical history and problem list were reviewed and updated as appropriate.    REVIEW OF SYSTEMS: Out of a complete 14 system review of symptoms, the patient complains only of the following symptoms, and all other reviewed systems are negative.  ALLERGIES: Allergies  Allergen Reactions   Sulfonamide Derivatives Other (See Comments)    Doesn't remember    HOME MEDICATIONS: Outpatient Medications Prior to Visit  Medication Sig Dispense Refill   aspirin EC 81 MG tablet Take 81 mg by mouth daily.     cyanocobalamin (,VITAMIN B-12,) 1000 MCG/ML injection INJECT 29mL MONTHLY 30 mL 1   Dextromethorphan-quiNIDine (NUEDEXTA) 20-10 MG capsule Take 1 capsule by mouth every 12 (twelve) hours.     docusate sodium (COLACE) 100 MG capsule Take 100 mg by mouth daily.     LORazepam (ATIVAN) 0.5 MG tablet Take 1 tablet (0.5 mg total) by mouth daily as needed for anxiety. 30 tablet 3   LORazepam (ATIVAN) 0.5 MG tablet Take 1 tablet (0.5 mg total) by  mouth 2 (two) times daily. 60 tablet 3   LUMIGAN 0.01 % SOLN Place 1 drop into both eyes daily.      mirtazapine (REMERON) 15 MG tablet Take 1 tablet (15 mg total) by mouth at bedtime. 30 tablet 5   ondansetron (ZOFRAN) 4 MG tablet Take 4 mg by mouth 4 (four) times daily as needed.     pravastatin (PRAVACHOL) 20 MG tablet Take 20 mg by mouth at bedtime.     No facility-administered medications prior to visit.    PAST MEDICAL HISTORY: Past Medical History:  Diagnosis  Date   Anxiety    Per [facility] New Patient Packet    Bleb x 4    Cerebral amyloid angiopathy (CODE) 12/09/2018   Depression    Per [facility] New Patient Packet    Dermatitis due to solar radiation    Dyshidrosis 05/03/2007   Fuchs' corneal dystrophy    Per [facility] New Patient Packet    Glaucoma    Heart murmur    Hyperlipidemia    Per [facility] New Patient Packet    Malignant melanoma ([place])    Per [facility] New Patient Packet    Osteopenia    Pernicious anemia    Per [facility] New Patient Packet    Tinnitus     PAST SURGICAL HISTORY: Past Surgical History:  Procedure Laterality Date   CORNEAL TRANSPLANT     Dr.Carlson-[facility], Per [facility] New Patient Packet    MASTECTOMY     PLACEMENT OF BREAST IMPLANTS      FAMILY HISTORY: Family History  Problem Relation Age of Onset   Breast cancer Mother    Stroke Other    Heart disease Other    Breast cancer Other    Dysphagia Neg Hx     SOCIAL HISTORY: Social History   Socioeconomic History   Marital status: Widowed    Spouse name: Not on file   Number of children: Not on file   Years of education: Not on file   Highest education level: Not on file  Occupational History    Employer: [employer]  Tobacco Use   Smoking status: Never   Smokeless tobacco: Never  Vaping Use   Vaping Use: Never used  Substance and Sexual Activity   Alcohol use: Yes    Comment: 1 drink weekly per [facility] New Patient Packet 12/05/2018    Drug use: Never   Sexual activity: Not Currently  Other Topics Concern   Not on file  Social History Narrative   Per [facility] New Patient Packet abstracted 12/05/2018:   Diet: Regular       Caffeine: Occasionally       Married, if yes what year: Widow, married 1965      Do you live in a house, apartment, assisted living, [place], trailer, ect: [place] resident at [facility], 1 stories, 1 person      Pets: No      Current/Past profession: Buyer, retail, Marine scientist    Exercise:Yes, 3 x weekly          Living Will: Yes   DNR:  Left blank    POA/HPOA: Left blank       Functional Status:   Do you have difficulty bathing or dressing yourself? No   Do you have difficulty preparing food or eating? No   Do you have difficulty managing your medications? No answer selected, notation- pill box filled by daughter   Do you have difficulty managing your finances? Yes, patients son  handles her finances    Do you have difficulty affording your medications? No   Social Determinants of Radio broadcast assistant Strain: Not on file  Food Insecurity: Not on file  Transportation Needs: Not on file  Physical Activity: Not on file  Stress: Not on file  Social Connections: Not on file  Intimate Partner Violence: Not on file      PHYSICAL EXAM  Vitals:   11/27/20 1423  BP: 105/70  Pulse: (!) 102  Weight: 134 lb (60.8 kg)   Body mass index is 25.32 kg/m.  Generalized: Well developed, in no acute distress   Neurological examination  Mentation: Alert oriented to time, place, history taking. Follows all commands.  Expressive aphasia noted Cranial nerve II-XII: Pupils were equal round reactive to light. Extraocular movements were full, visual field were full on confrontational test. Facial sensation and strength were normal. Uvula tongue midline. Head turning and shoulder shrug  were normal and symmetric. Motor: The motor testing reveals 5 over 5 strength of all 4 extremities with exception of 4/5 strength in the right lower extremity good symmetric motor tone is noted throughout.  Sensory: Sensory testing is intact to soft touch on all 4 extremities. No evidence of extinction is noted.  Coordination: Cerebellar testing reveals good finger-nose-finger and heel-to-shin bilaterally.  Gait and station: Needs assistance with ambulating.  Intermittently drags the right leg when ambulating.  Leans to the right. Reflexes: Deep tendon reflexes are symmetric and normal bilaterally.   DIAGNOSTIC DATA (LABS, IMAGING, TESTING) - I  reviewed patient records, labs, notes, testing and imaging myself where available.  Lab Results  Component Value Date   WBC 5.3 09/03/2020   HGB 13.1 09/03/2020   HCT 39 09/03/2020   MCV 87.7 02/17/2020   PLT 397 09/03/2020      Component Value Date/Time   NA 145 09/03/2020 0000   K 4.4 09/03/2020 0000   CL 105 09/03/2020 0000   CO2 26 (A) 09/03/2020 0000   GLUCOSE 132 (H) 02/17/2020 1344   GLUCOSE 96 01/19/2006 0904   BUN 22 (A) 09/03/2020 0000   CREATININE 0.9 09/03/2020 0000   CREATININE 0.70 02/17/2020 1344   CALCIUM 9.6 09/03/2020 0000   PROT 7.8 02/17/2020 1322   ALBUMIN 4.4 09/03/2020 0000   AST 27 09/03/2020 0000   ALT 15 09/03/2020 0000   ALKPHOS 104 09/03/2020 0000   BILITOT <0.1 (L) 02/17/2020 1322   GFRNONAA >60 02/17/2020 1322   GFRAA 81 (L) 03/10/2013 1852   Lab Results  Component Value Date   CHOL 185 09/03/2020   HDL 52 09/03/2020   LDLCALC 114 09/03/2020   LDLDIRECT 165.1 03/08/2013   TRIG 118 09/03/2020   CHOLHDL 3.0 02/18/2020   Lab Results  Component Value Date   HGBA1C 5.7 (H) 02/18/2020   Lab Results  Component Value Date   BWIOMBTD97 416 03/07/2018   Lab Results  Component Value Date   TSH 1.77 04/11/2017      ASSESSMENT AND PLAN 80 y.o. year old female  has a past medical history of Anxiety, Bleb x 4, Cerebral amyloid angiopathy (CODE) (12/09/2018), Depression, Dermatitis due to solar radiation, Dyshidrosis (05/03/2007), Fuchs' corneal dystrophy, Glaucoma, Heart murmur, Hyperlipidemia, Malignant melanoma ([place]), Osteopenia, Pernicious anemia, and Tinnitus. here with :  1.  Intermittently dragging the right leg, weakness  Discussed with the family about sending the patient for imaging-particularly CT scan without contrast.  Daughter deferred for now states that she will discuss with her brother. Advised  if symptoms worsen she should be taken to the emergency room Has a diagnosis of cerebral amyloid angiopathy patient reviewed  potential risk associated. Continue on aspirin 81 mg daily Follow-up in 6 months or sooner if needed    Ward Givens, MSN, NP-C 11/27/2020, 2:57 PM [facility] [address], [place], [place] [address] [phone]

## 2020-12-08 ENCOUNTER — Other Ambulatory Visit: Payer: Self-pay | Admitting: Adult Health

## 2020-12-08 DIAGNOSIS — F418 Other specified anxiety disorders: Secondary | ICD-10-CM

## 2020-12-08 MED ORDER — LORAZEPAM 0.5 MG PO TABS: 0.5000 mg | ORAL_TABLET | Freq: Two times a day (BID) | ORAL | 3 refills | Status: DC

## 2020-12-09 ENCOUNTER — Encounter: Payer: Self-pay | Admitting: Orthopedic Surgery

## 2020-12-09 ENCOUNTER — Non-Acute Institutional Stay (SKILLED_NURSING_FACILITY): Payer: Medicare Other | Admitting: Orthopedic Surgery

## 2020-12-09 DIAGNOSIS — E782 Mixed hyperlipidemia: Secondary | ICD-10-CM

## 2020-12-09 DIAGNOSIS — R531 Weakness: Secondary | ICD-10-CM | POA: Diagnosis not present

## 2020-12-09 DIAGNOSIS — K5901 Slow transit constipation: Secondary | ICD-10-CM

## 2020-12-09 DIAGNOSIS — F02B0 Dementia in other diseases classified elsewhere, moderate, without behavioral disturbance, psychotic disturbance, mood disturbance, and anxiety: Secondary | ICD-10-CM

## 2020-12-09 DIAGNOSIS — F482 Pseudobulbar affect: Secondary | ICD-10-CM | POA: Diagnosis not present

## 2020-12-09 DIAGNOSIS — F418 Other specified anxiety disorders: Secondary | ICD-10-CM

## 2020-12-09 DIAGNOSIS — D510 Vitamin B12 deficiency anemia due to intrinsic factor deficiency: Secondary | ICD-10-CM

## 2020-12-09 NOTE — Progress Notes (Signed)
Location:   [facility] Room Number: 614 Place of Service:  SNF [phone]) Provider:  Windell Moulding, NP  Virgie Dad, MD  Patient Care Team: Virgie Dad, MD as PCP - General (Internal Medicine) [place], Micaylah Bertucci, MD as Consulting Physician (Dermatology) Luberta Mutter, MD as Consulting Physician (Ophthalmology) Star Age, MD as Attending Physician (Neurology)  Extended Emergency Contact Information Primary Emergency Contact: [name] States of [place] Phone: [phone] Mobile Phone: [phone] Relation: Daughter Secondary Emergency Contact: Sharion Dove of [place] Phone: [phone] Work Phone: [phone] Mobile Phone: [phone] Relation: Son  Code Status:  DNR Goals of care: Advanced Directive information Advanced Directives 12/09/2020  Does Patient Have a Medical Advance Directive? Yes  Type of Advance Directive Out of facility DNR (pink MOST or yellow form)  Does patient want to make changes to medical advance directive? No - Patient declined  Copy of [place] in Chart? -  Would patient like information on creating a medical advance directive? -  Pre-existing out of facility DNR order (yellow form or pink MOST form) Yellow form placed in chart (order not valid for inpatient use)     Chief Complaint  Patient presents with   Acute Visit    Increased agitation    HPI:  Pt is a 80 y.o. female seen today for an acute visit for increased crying and anxiety.   She resides on the memory care unit at [facility] due to dementia with cerebral amyloid angiopathy.    Nursing reports increased crying and anxiety over the past few days. During these episodes she is not easily redirected. Unclear if nursing administers Ativan prn. She has a history of pseudobulbar affect and takes Nudexta. She is also on scheduled Ativan bid for increased anxiety.   Neurocognitive disorder- MMSE  18/30 2020, continues to have caregiver Right sided weakness- 10/17 she was noted to have right sided weakness and ambulating poorly, family did not want aggressive treatment- neurology consult advised, at this time she is ambulating well, no recent falls, right sided weakness has subsided, PT/OT/ST consulted HLD- LDL 114 09/03/2020, remains on statin Constipation- LBM 10/28, colace daily Pernicious anemia- receives B12 injections monthly   Past Medical History:  Diagnosis Date   Anxiety    Per [facility] New Patient Packet    Bleb x 4    Cerebral amyloid angiopathy (CODE) 12/09/2018   Depression    Per [facility] New Patient Packet    Dermatitis due to solar radiation    Dyshidrosis 05/03/2007   Fuchs' corneal dystrophy    Per [facility] New Patient Packet    Glaucoma    Heart murmur    Hyperlipidemia    Per [facility] New Patient Packet    Malignant melanoma ([place])    Per [facility] New Patient Packet    Osteopenia    Pernicious anemia    Per [facility] New Patient Packet    Tinnitus    Past Surgical History:  Procedure Laterality Date   CORNEAL TRANSPLANT     Dr.Carlson-[facility], Per [facility] New Patient Packet    MASTECTOMY     PLACEMENT OF BREAST IMPLANTS      Allergies  Allergen Reactions   Sulfonamide Derivatives Other (See Comments)    Doesn't remember    Allergies as of 12/09/2020       Reactions   Sulfonamide Derivatives Other (See Comments)   Doesn't remember        Medication List  Accurate as of December 09, 2020  9:19 AM. If you have any questions, ask your nurse or doctor.          aspirin EC 81 MG tablet Take 81 mg by mouth daily.   cyanocobalamin 1000 MCG/ML injection Commonly known as: (VITAMIN B-12) INJECT 42mL MONTHLY   Dextromethorphan-quiNIDine 20-10 MG capsule Commonly known as: NUEDEXTA Take 1 capsule by mouth every 12 (twelve) hours.   docusate sodium 100 MG capsule Commonly known as: COLACE Take 100 mg by mouth daily.   LORazepam 0.5 MG tablet Commonly known as:  ATIVAN Take 1 tablet (0.5 mg total) by mouth daily as needed for anxiety.   LORazepam 0.5 MG tablet Commonly known as: ATIVAN Take 1 tablet (0.5 mg total) by mouth 2 (two) times daily.   Lumigan 0.01 % Soln Generic drug: bimatoprost Place 1 drop into both eyes daily.   mirtazapine 15 MG tablet Commonly known as: REMERON Take 1 tablet (15 mg total) by mouth at bedtime.   ondansetron 4 MG tablet Commonly known as: ZOFRAN Take 4 mg by mouth 4 (four) times daily as needed.   pravastatin 20 MG tablet Commonly known as: PRAVACHOL Take 20 mg by mouth at bedtime.        Review of Systems  Unable to perform ROS: Dementia   Immunization History  Administered Date(s) Administered   Influenza Inj Mdck Quad Pf 11/12/2020   Influenza Split 10/25/2011   Influenza Whole 02/08/2005, 11/08/2008, 11/15/2009, 10/20/2010   Influenza, High Dose Seasonal PF 02/12/2014, 12/19/2015, 12/25/2017, 12/06/2018, 11/30/2019   Influenza-Unspecified 10/10/2014, 12/12/2016, 01/22/2018   Moderna SARS-COV2 Booster Vaccination 07/13/2020   Moderna Sars-Covid-2 Vaccination 02/20/2019, 03/25/2019, 12/20/2019   Pneumococcal Conjugate-13 03/08/2013   Pneumococcal Polysaccharide-23 05/03/2007   Td 02/09/2000   Tdap 10/20/2010, 11/13/2020   Zoster Recombinat (Shingrix) 04/25/2020, 06/25/2020   Pertinent  Health Maintenance Due  Topic Date Due   INFLUENZA VACCINE  Completed   DEXA SCAN  Completed   Fall Risk 02/18/2020 02/18/2020 02/19/2020 02/27/2020 09/26/2020  Falls in the past year? - - - 0 1  Was there an injury with Fall? - - - 0 1  Fall Risk Category Calculator - - - 0 2  Fall Risk Category - - - Low Moderate  Patient Fall Risk Level High fall risk High fall risk High fall risk - -  Patient at Risk for Falls Due to - - - - History of fall(s)  Fall risk Follow up - - - - Falls evaluation completed   Functional Status Survey:    Vitals:   12/09/20 0909  BP: (!) 136/91  Pulse: 94  Resp: 20   Temp: 97.9 F (36.6 C)  SpO2: 93%  Weight: 132 lb 9.6 oz (60.1 kg)  Height: 5\' 1"  (1.549 m)   Body mass index is 25.05 kg/m. Physical Exam Vitals reviewed.  Constitutional:      General: She is not in acute distress. HENT:     Head: Normocephalic.  Eyes:     General:        Right eye: No discharge.        Left eye: No discharge.  Neck:     Vascular: No carotid bruit.  Cardiovascular:     Rate and Rhythm: Normal rate and regular rhythm.     Pulses: Normal pulses.     Heart sounds: Normal heart sounds. No murmur heard. Pulmonary:     Effort: Pulmonary effort is normal. No respiratory distress.     Breath  sounds: Normal breath sounds. No wheezing.  Abdominal:     General: Abdomen is flat. Bowel sounds are normal. There is no distension.     Palpations: Abdomen is soft.     Tenderness: There is no abdominal tenderness.  Musculoskeletal:     Cervical back: Normal range of motion.     Right lower leg: No edema.     Left lower leg: No edema.  Skin:    General: Skin is warm and dry.     Capillary Refill: Capillary refill takes less than 2 seconds.  Neurological:     General: No focal deficit present.     Mental Status: She is alert. Mental status is at baseline.     Motor: Weakness present.     Gait: Gait abnormal.     Comments: Right and left hand grip 5/5  Psychiatric:        Mood and Affect: Mood normal.        Behavior: Behavior normal.        Cognition and Memory: Memory is impaired.     Comments: Very pleasant during encounter, alert to self only    Labs reviewed: Recent Labs    02/17/20 1322 02/17/20 1344 09/03/20 0000  NA 141 143 145  K 3.9 3.7 4.4  CL 106 107 105  CO2 23  --  26*  GLUCOSE 140* 132*  --   BUN 16 19 22*  CREATININE 0.78 0.70 0.9  CALCIUM 9.4  --  9.6   Recent Labs    02/17/20 1322 09/03/20 0000  AST 31 27  ALT 31 15  ALKPHOS 87 104  BILITOT <0.1*  --   PROT 7.8  --   ALBUMIN 4.5 4.4   Recent Labs    02/17/20 1322  02/17/20 1344 09/03/20 0000  WBC 6.0  --  5.3  NEUTROABS 4.5  --   --   HGB 14.7 16.7* 13.1  HCT 47.7* 49.0* 39  MCV 87.7  --   --   PLT 255  --  397   Lab Results  Component Value Date   TSH 1.77 04/11/2017   Lab Results  Component Value Date   HGBA1C 5.7 (H) 02/18/2020   Lab Results  Component Value Date   CHOL 185 09/03/2020   HDL 52 09/03/2020   LDLCALC 114 09/03/2020   LDLDIRECT 165.1 03/08/2013   TRIG 118 09/03/2020   CHOLHDL 3.0 02/18/2020    Significant Diagnostic Results in last 30 days:  No results found.  Assessment/Plan 1. Situational anxiety - ongoing, unclear if behaviors are improving after administering ativan prn - very pleasant, followed commands during encounter today - cont ativan 0.5 mg po bid - cont ativan 0.5 mg po daily prn - if incidents continue to increase- will consult Dr.Plovsky  2. Major neurocognitive disorder, due to another medical condition, without behavioral disturbance, moderate - cont memory care  3. Right sided weakness - ? TIA/CVA? - first reported 10/17- family did not want aggressive treatment - f/u neurology advised - she is ambulating at this time - bilateral hand grips 5/5 - cont asa and statin  4. PBA (pseudobulbar affect) - stable with Nuedexta  5. Mixed hyperlipidemia - LDL 114 08/2020 - cont statin  6. Slow transit constipation - LBM 10/28 - cont colace  7. Pernicious anemia - cont B12 injections monthly    Family/ staff Communication: plan discussed with nurse  Labs/tests ordered:   none

## 2020-12-22 ENCOUNTER — Encounter: Payer: Self-pay | Admitting: Adult Health

## 2020-12-22 ENCOUNTER — Non-Acute Institutional Stay (SKILLED_NURSING_FACILITY): Payer: Medicare Other | Admitting: Adult Health

## 2020-12-22 DIAGNOSIS — I680 Cerebral amyloid angiopathy: Secondary | ICD-10-CM | POA: Diagnosis not present

## 2020-12-22 DIAGNOSIS — E782 Mixed hyperlipidemia: Secondary | ICD-10-CM

## 2020-12-22 DIAGNOSIS — F02818 Dementia in other diseases classified elsewhere, unspecified severity, with other behavioral disturbance: Secondary | ICD-10-CM | POA: Diagnosis not present

## 2020-12-22 DIAGNOSIS — F482 Pseudobulbar affect: Secondary | ICD-10-CM | POA: Diagnosis not present

## 2020-12-22 NOTE — Progress Notes (Signed)
Location:   [facility] of Service:  SNF (31) Provider:  Royal Hawthorn, NP  Virgie Dad, MD  Patient Care Team: Virgie Dad, MD as PCP - General (Internal Medicine) Martin[place], Amy, MD as Consulting Physician (Dermatology) Luberta Mutter, MD as Consulting Physician (Ophthalmology) Star Age, MD as Attending Physician (Neurology)  Extended Emergency Contact Information Primary Emergency Contact: [name] States of [place] Phone: [phone] Mobile Phone: [phone] Relation: Daughter Secondary Emergency Contact: Sharion Dove of [place] Phone: [phone] Work Phone: [phone] Mobile Phone: [phone] Relation: Son  Code Status:  DNR Goals of care: Advanced Directive information Advanced Directives 12/09/2020  Does Patient Have a Medical Advance Directive? Yes  Type of Advance Directive Out of facility DNR (pink MOST or yellow form)  Does patient want to make changes to medical advance directive? No - Patient declined  Copy of [place] in Chart? -  Would patient like information on creating a medical advance directive? -  Pre-existing out of facility DNR order (yellow form or pink MOST form) Yellow form placed in chart (order not valid for inpatient use)     Chief Complaint  Patient presents with   Medical Management of Chronic Issues    HPI:  Pt is a 80 y.o. female seen today for medical management of chronic diseases.   PMH significant for major neurocognitive disorder, anxiety with panic attacks, PBA, constipation, depression, glaucoma, pernicious anemia, osteopenia, HLD, CVA.   She was having increased periods of crying, agitation, and anxiety and Remeron was increase to 30 mg on 12/12/20.  No issues with excess sedation. Weight is down 4 lbs in the past month. Continues to participate in activities in the memory care setting.  Wt Readings from Last 3  Encounters:  12/22/20 130 lb 3.2 oz (59.1 kg)  12/09/20 132 lb 9.6 oz (60.1 kg)  11/27/20 134 lb (60.8 kg)   LDL 134 09/03/20, pravastatin increased to 20 mg due to possible CVA in Oct 2022  Past Medical History:  Diagnosis Date   Anxiety    Per [facility] New Patient Packet    Bleb x 4    Cerebral amyloid angiopathy (CODE) 12/09/2018   Depression    Per [facility] New Patient Packet    Dermatitis due to solar radiation    Dyshidrosis 05/03/2007   Fuchs' corneal dystrophy    Per [facility] New Patient Packet    Glaucoma    Heart murmur    Hyperlipidemia    Per [facility] New Patient Packet    Malignant melanoma ([place])    Per [facility] Patient Packet    Osteopenia    Pernicious anemia    Per [facility] New Patient Packet    Tinnitus    Past Surgical History:  Procedure Laterality Date   CORNEAL TRANSPLANT     Dr.Carlson-[facility], Per [facility] New Patient Packet    MASTECTOMY     PLACEMENT OF BREAST IMPLANTS      Allergies  Allergen Reactions   Sulfonamide Derivatives Other (See Comments)    Doesn't remember    Allergies as of 12/22/2020       Reactions   Sulfonamide Derivatives Other (See Comments)   Doesn't remember        Medication List        Accurate as of December 22, 2020 10:22 AM. If you have any questions, ask your nurse or doctor.  STOP taking these medications    mirtazapine 15 MG tablet Commonly known as: REMERON Stopped by: Royal Hawthorn, NP       TAKE these medications    aspirin EC 81 MG tablet Take 81 mg by mouth daily.   cyanocobalamin 1000 MCG/ML injection Commonly known as: (VITAMIN B-12) INJECT 62mL MONTHLY   Dextromethorphan-quiNIDine 20-10 MG capsule Commonly known as: NUEDEXTA Take 1 capsule by mouth every 12 (twelve) hours.   docusate sodium 100 MG capsule Commonly known as: COLACE Take 100 mg by mouth daily.   LORazepam 0.5 MG tablet Commonly known as: ATIVAN Take 1 tablet (0.5 mg total) by mouth daily as needed for anxiety.   LORazepam 0.5  MG tablet Commonly known as: ATIVAN Take 1 tablet (0.5 mg total) by mouth 2 (two) times daily.   Lumigan 0.01 % Soln Generic drug: bimatoprost Place 1 drop into both eyes daily.   ondansetron 4 MG tablet Commonly known as: ZOFRAN Take 4 mg by mouth 4 (four) times daily as needed.   pravastatin 20 MG tablet Commonly known as: PRAVACHOL Take 20 mg by mouth at bedtime.        Review of Systems  Constitutional:  Negative for chills, diaphoresis and fever.  Respiratory:  Negative for cough and wheezing.   Cardiovascular:  Negative for chest pain and leg swelling.  Gastrointestinal:  Negative for abdominal pain, constipation, diarrhea, nausea and vomiting.  Genitourinary:  Negative for dysuria and urgency.  Musculoskeletal:  Positive for gait problem. Negative for back pain, myalgias and neck pain.  Skin:  Negative for rash.  Neurological:  Negative for weakness (chronic left sided weakness).  Psychiatric/Behavioral:  Positive for confusion. Negative for sleep disturbance. The patient is nervous/anxious.    Immunization History  Administered Date(s) Administered   Influenza Inj Mdck Quad Pf 11/12/2020   Influenza Split 10/25/2011   Influenza Whole 02/08/2005, 11/08/2008, 11/15/2009, 10/20/2010   Influenza, High Dose Seasonal PF 02/12/2014, 12/19/2015, 12/25/2017, 12/06/2018, 11/30/2019   Influenza-Unspecified 10/10/2014, 12/12/2016, 01/22/2018   Moderna SARS-COV2 Booster Vaccination 07/13/2020   Moderna Sars-Covid-2 Vaccination 02/20/2019, 03/25/2019, 12/20/2019   Pneumococcal Conjugate-13 03/08/2013   Pneumococcal Polysaccharide-23 05/03/2007   Td 02/09/2000   Tdap 10/20/2010, 11/13/2020   Zoster Recombinat (Shingrix) 04/25/2020, 06/25/2020   Pertinent  Health Maintenance Due  Topic Date Due   INFLUENZA VACCINE  Completed   DEXA SCAN  Completed   Fall Risk  09/26/2020 02/27/2020 01/09/2020 09/12/2019 03/28/2019  Falls in the past year? 1 0 0 0 0  Comment - - - - -  Number  falls in past yr: 0 0 0 0 0  Injury with Fall? 1 0 0 0 0  Risk for fall due to : History of fall(s) - - - -  Follow up Falls evaluation completed - - - -   Functional Status Survey:    Vitals:   12/22/20 1021  Weight: 130 lb 3.2 oz (59.1 kg)   Body mass index is 24.6 kg/m. Wt Readings from Last 3 Encounters:  12/22/20 130 lb 3.2 oz (59.1 kg)  12/09/20 132 lb 9.6 oz (60.1 kg)  11/27/20 134 lb (60.8 kg)    Physical Exam Vitals and nursing note reviewed.  Constitutional:      General: She is not in acute distress.    Appearance: She is not diaphoretic.  HENT:     Head: Normocephalic and atraumatic.     Nose: Nose normal. No congestion.     Mouth/Throat:     Mouth: Mucous  membranes are moist.     Pharynx: Oropharynx is clear.  Eyes:     General:        Right eye: No discharge.        Left eye: No discharge.     Conjunctiva/sclera: Conjunctivae normal.     Pupils: Pupils are equal, round, and reactive to light.  Neck:     Vascular: No JVD.  Cardiovascular:     Rate and Rhythm: Normal rate and regular rhythm.     Heart sounds: No murmur heard. Pulmonary:     Effort: Pulmonary effort is normal. No respiratory distress.     Breath sounds: Normal breath sounds. No wheezing.  Abdominal:     General: Bowel sounds are normal. There is no distension.     Palpations: Abdomen is soft.  Musculoskeletal:        General: No swelling, tenderness, deformity or signs of injury.     Cervical back: No rigidity or tenderness.     Right lower leg: No edema.     Left lower leg: No edema.  Lymphadenopathy:     Cervical: No cervical adenopathy.  Skin:    General: Skin is warm and dry.  Neurological:     Mental Status: She is alert. Mental status is at baseline.     Comments: Oriented to self. Able to f/c.  LUE 4/5 RUE 5/5  Psychiatric:        Mood and Affect: Mood normal.    Labs reviewed: Recent Labs    02/17/20 1322 02/17/20 1344 09/03/20 0000  NA 141 143 145  K 3.9 3.7  4.4  CL 106 107 105  CO2 23  --  26*  GLUCOSE 140* 132*  --   BUN 16 19 22*  CREATININE 0.78 0.70 0.9  CALCIUM 9.4  --  9.6    Recent Labs    02/17/20 1322 09/03/20 0000  AST 31 27  ALT 31 15  ALKPHOS 87 104  BILITOT <0.1*  --   PROT 7.8  --   ALBUMIN 4.5 4.4    Recent Labs    02/17/20 1322 02/17/20 1344 09/03/20 0000  WBC 6.0  --  5.3  NEUTROABS 4.5  --   --   HGB 14.7 16.7* 13.1  HCT 47.7* 49.0* 39  MCV 87.7  --   --   PLT 255  --  397    Lab Results  Component Value Date   TSH 1.77 04/11/2017   Lab Results  Component Value Date   HGBA1C 5.7 (H) 02/18/2020   Lab Results  Component Value Date   CHOL 185 09/03/2020   HDL 52 09/03/2020   LDLCALC 114 09/03/2020   LDLDIRECT 165.1 03/08/2013   TRIG 118 09/03/2020   CHOLHDL 3.0 02/18/2020    Significant Diagnostic Results in last 30 days:  No results found.  Assessment/Plan  1. Major neurocognitive disorder due to another medical condition with behavioral disturbance Continues to need prn ativan for crying and agitation. Would give Remeron a full 4 weeks before re evaluation. Consider psych eval if not improving  2. Cerebral amyloid angiopathy (CODE) With prior hx of CVA On asa and statin  3. PBA (pseudobulbar affect) Continues on Nuedexta, did not tolerate taper  4. Mixed hyperlipidemia On pravastatin Will check lipid and other labs in Dec.     Family/ staff Communication: nurse and caregiver  Labs/tests ordered:  NA

## 2021-01-16 ENCOUNTER — Non-Acute Institutional Stay (SKILLED_NURSING_FACILITY): Payer: Medicare Other | Admitting: Adult Health

## 2021-01-16 ENCOUNTER — Encounter: Payer: Self-pay | Admitting: Adult Health

## 2021-01-16 DIAGNOSIS — F482 Pseudobulbar affect: Secondary | ICD-10-CM | POA: Diagnosis not present

## 2021-01-16 DIAGNOSIS — K5901 Slow transit constipation: Secondary | ICD-10-CM | POA: Diagnosis not present

## 2021-01-16 DIAGNOSIS — E782 Mixed hyperlipidemia: Secondary | ICD-10-CM

## 2021-01-16 DIAGNOSIS — F4321 Adjustment disorder with depressed mood: Secondary | ICD-10-CM

## 2021-01-16 DIAGNOSIS — I680 Cerebral amyloid angiopathy: Secondary | ICD-10-CM

## 2021-01-16 DIAGNOSIS — D510 Vitamin B12 deficiency anemia due to intrinsic factor deficiency: Secondary | ICD-10-CM | POA: Diagnosis not present

## 2021-01-16 DIAGNOSIS — I679 Cerebrovascular disease, unspecified: Secondary | ICD-10-CM

## 2021-01-16 NOTE — Progress Notes (Signed)
Location:   [facility] Room Number: 035 Place of Service:  SNF ([phone]) Provider:  Royal Hawthorn, NP   Virgie Dad, MD  Patient Care Team: Virgie Dad, MD as PCP - General (Internal Medicine) Martinique, Amy, MD as Consulting Physician (Dermatology) Luberta Mutter, MD as Consulting Physician (Ophthalmology) Star Age, MD as Attending Physician (Neurology)  Extended Emergency Contact Information Primary Emergency Contact: [name] States of [place] Phone: [phone] Mobile Phone: [phone] Relation: Daughter Secondary Emergency Contact: Sharion Dove of [place] Phone: [phone] Work Phone: [phone] Mobile Phone: [phone] Relation: Son  Code Status:  DNR Goals of care: Advanced Directive information Advanced Directives 01/16/2021  Does Patient Have a Medical Advance Directive? Yes  Type of Advance Directive Out of facility DNR (pink MOST or yellow form)  Does patient want to make changes to medical advance directive? No - Patient declined  Copy of [place] in Chart? -  Would patient like information on creating a medical advance directive? -  Pre-existing out of facility DNR order (yellow form or pink MOST form) Yellow form placed in chart (order not valid for inpatient use)     Chief Complaint  Patient presents with   Medical Management of Chronic Issues    HPI:  Pt is a 80 y.o. female seen today for medical management of chronic diseases.   PMH significant for major neurocognitive disorder, anxiety with panic attacks, PBA, constipation, depression, glaucoma, pernicious anemia, osteopenia, HLD, CVA.   Nurse reports that she has crying episodes and some agitation in the evening hrs and uses prn ativan. Remeron was increased to 30mg  in Nov with mild benefit noted. She will f/I with Dr. Casimiro Needle 12/15  She did have some trouble chewing meats and was placed  on ground meats but this was changed back as it seemed to be an issue regarding the type of food she had rather than a swallow issue. No currently swallowing problems.   Remains ambulatory but gait slower.   Had a possible CVA in Oct with right sided weakness. Also has a hx of acute ischemic stroke 1/22.  Statin was increased. Already on asa  Wt Readings from Last 3 Encounters:  01/16/21 130 lb 3.2 oz (59.1 kg)  12/22/20 130 lb 3.2 oz (59.1 kg)  12/09/20 132 lb 9.6 oz (60.1 kg)     Past Medical History:  Diagnosis Date   Anxiety    Per [facility] New Patient Packet    Bleb x 4    Cerebral amyloid angiopathy (CODE) 12/09/2018   Depression    Per [facility] New Patient Packet    Dermatitis due to solar radiation    Dyshidrosis 05/03/2007   Fuchs' corneal dystrophy    Per [facility] New Patient Packet    Glaucoma    Heart murmur    Hyperlipidemia    Per [facility] New Patient Packet    Malignant melanoma ([place])    Per [facility] New Patient Packet    Osteopenia    Pernicious anemia    Per [facility] New Patient Packet    Tinnitus    Past Surgical History:  Procedure Laterality Date   CORNEAL TRANSPLANT     Dr.Carlson-[facility], Per [facility] New Patient Packet    MASTECTOMY     PLACEMENT OF BREAST IMPLANTS      Allergies  Allergen Reactions   Sulfonamide Derivatives Other (See Comments)    Doesn't remember    Allergies as of 01/16/2021  Reactions   Sulfonamide Derivatives Other (See Comments)   Doesn't remember        Medication List        Accurate as of January 16, 2021  9:52 AM. If you have any questions, ask your nurse or doctor.          aspirin EC 81 MG tablet Take 81 mg by mouth daily.   cyanocobalamin 1000 MCG/ML injection Commonly known as: (VITAMIN B-12) INJECT 98mL MONTHLY   Dextromethorphan-quiNIDine 20-10 MG capsule Commonly known as: NUEDEXTA Take 1 capsule by mouth every 12 (twelve) hours.   docusate sodium 100 MG capsule Commonly known as: COLACE Take 100 mg by mouth  daily.   LORazepam 0.5 MG tablet Commonly known as: ATIVAN Take 1 tablet (0.5 mg total) by mouth daily as needed for anxiety.   LORazepam 0.5 MG tablet Commonly known as: ATIVAN Take 1 tablet (0.5 mg total) by mouth 2 (two) times daily.   Lumigan 0.01 % Soln Generic drug: bimatoprost Place 1 drop into both eyes daily.   mirtazapine 30 MG tablet Commonly known as: REMERON Take 30 mg by mouth at bedtime.   ondansetron 4 MG tablet Commonly known as: ZOFRAN Take 4 mg by mouth 4 (four) times daily as needed.   pravastatin 20 MG tablet Commonly known as: PRAVACHOL Take 20 mg by mouth at bedtime.        Review of Systems  Unable to perform ROS: Dementia   Immunization History  Administered Date(s) Administered   Influenza Inj Mdck Quad Pf 11/12/2020   Influenza Split 10/25/2011   Influenza Whole 02/08/2005, 11/08/2008, 11/15/2009, 10/20/2010   Influenza, High Dose Seasonal PF 02/12/2014, 12/19/2015, 12/25/2017, 12/06/2018, 11/30/2019   Influenza-Unspecified 10/10/2014, 12/12/2016, 01/22/2018   Moderna SARS-COV2 Booster Vaccination 07/13/2020   Moderna Sars-Covid-2 Vaccination 02/20/2019, 03/25/2019, 12/20/2019   Pneumococcal Conjugate-13 03/08/2013   Pneumococcal Polysaccharide-23 05/03/2007   Td 02/09/2000   Tdap 10/20/2010, 11/13/2020   Zoster Recombinat (Shingrix) 04/25/2020, 06/25/2020   Pertinent  Health Maintenance Due  Topic Date Due   INFLUENZA VACCINE  Completed   DEXA SCAN  Completed   Fall Risk 02/18/2020 02/18/2020 02/19/2020 02/27/2020 09/26/2020  Falls in the past year? - - - 0 1  Was there an injury with Fall? - - - 0 1  Fall Risk Category Calculator - - - 0 2  Fall Risk Category - - - Low Moderate  Patient Fall Risk Level High fall risk High fall risk High fall risk - -  Patient at Risk for Falls Due to - - - - History of fall(s)  Fall risk Follow up - - - - Falls evaluation completed   Functional Status Survey:    Vitals:   01/16/21 0941  BP:  (!) 149/88  Pulse: 83  Resp: 18  Temp: 97.8 F (36.6 C)  SpO2: 96%  Weight: 130 lb 3.2 oz (59.1 kg)  Height: 5\' 1"  (1.549 m)   Body mass index is 24.6 kg/m. Physical Exam Vitals and nursing note reviewed.  Constitutional:      General: She is not in acute distress.    Appearance: She is not diaphoretic.  HENT:     Head: Normocephalic and atraumatic.     Mouth/Throat:     Mouth: Mucous membranes are moist.     Pharynx: Oropharynx is clear. No oropharyngeal exudate.  Neck:     Vascular: No JVD.  Cardiovascular:     Rate and Rhythm: Normal rate and regular rhythm.  Heart sounds: No murmur heard. Pulmonary:     Effort: Pulmonary effort is normal. No respiratory distress.     Breath sounds: Normal breath sounds. No wheezing.  Abdominal:     General: Bowel sounds are normal. There is no distension.     Palpations: Abdomen is soft.  Musculoskeletal:        General: No swelling, tenderness, deformity or signs of injury.     Right lower leg: No edema.     Left lower leg: No edema.  Skin:    General: Skin is warm and dry.  Neurological:     Mental Status: She is alert. Mental status is at baseline.     Gait: Gait abnormal.     Comments: Alert able to f/c.  Verbal but can not answer questions appropriately. Gait is slower with more shuffling.   Psychiatric:        Mood and Affect: Mood normal.    Labs reviewed: Recent Labs    02/17/20 1322 02/17/20 1344 09/03/20 0000  NA 141 143 145  K 3.9 3.7 4.4  CL 106 107 105  CO2 23  --  26*  GLUCOSE 140* 132*  --   BUN 16 19 22*  CREATININE 0.78 0.70 0.9  CALCIUM 9.4  --  9.6   Recent Labs    02/17/20 1322 09/03/20 0000  AST 31 27  ALT 31 15  ALKPHOS 87 104  BILITOT <0.1*  --   PROT 7.8  --   ALBUMIN 4.5 4.4   Recent Labs    02/17/20 1322 02/17/20 1344 09/03/20 0000  WBC 6.0  --  5.3  NEUTROABS 4.5  --   --   HGB 14.7 16.7* 13.1  HCT 47.7* 49.0* 39  MCV 87.7  --   --   PLT 255  --  397   Lab Results   Component Value Date   TSH 1.77 04/11/2017   Lab Results  Component Value Date   HGBA1C 5.7 (H) 02/18/2020   Lab Results  Component Value Date   CHOL 185 09/03/2020   HDL 52 09/03/2020   LDLCALC 114 09/03/2020   LDLDIRECT 165.1 03/08/2013   TRIG 118 09/03/2020   CHOLHDL 3.0 02/18/2020    Significant Diagnostic Results in last 30 days:  No results found.  Assessment/Plan  1. PBA (pseudobulbar affect) Continues to have some periods of crying Continues on Nuedexta and Remeron F/U with Psych  2. Mixed hyperlipidemia Lab Results  Component Value Date   LDLCALC 114 09/03/2020  On statin  Recheck lipid panel, dose increase started Sept.   3. Slow transit constipation Continue colace  4. Pernicious anemia Continue B12 Check CBC  5. Situational depression Has period of crying, would not taper Remeron. Possibly mildly improved and tends to occur in the afternoon   6. Cerebral amyloid angiopathy (CODE) With hx of CVA ON statin and asa  NO further intervention due to her age and goals of care.   7. Cerebrovascular disease With hx of CVA ON statin and asa  NO further intervention due to her age and goals of care.   Family/ staff Communication: nurse  Labs/tests ordered:  CBC BMP Lipid

## 2021-01-19 LAB — BASIC METABOLIC PANEL
BUN: 19 (ref 4–21)
CO2: 25 — AB (ref 13–22)
Chloride: 108 (ref 99–108)
Creatinine: 0.8 (ref 0.5–1.1)
Glucose: 96
Potassium: 4 (ref 3.4–5.3)
Sodium: 150 — AB (ref 137–147)

## 2021-01-19 LAB — CBC AND DIFFERENTIAL
HCT: 43 (ref 36–46)
Hemoglobin: 13.7 (ref 12.0–16.0)
Platelets: 219 (ref 150–399)
WBC: 7.3

## 2021-01-19 LAB — HEPATIC FUNCTION PANEL
ALT: 12 (ref 7–35)
AST: 22 (ref 13–35)
Alkaline Phosphatase: 113 (ref 25–125)
Bilirubin, Total: 0.3

## 2021-01-19 LAB — LIPID PANEL
Cholesterol: 186 (ref 0–200)
HDL: 69 (ref 35–70)
LDL Cholesterol: 105
Triglycerides: 65 (ref 40–160)

## 2021-01-19 LAB — CBC: RBC: 5.03 (ref 3.87–5.11)

## 2021-01-19 LAB — COMPREHENSIVE METABOLIC PANEL: Calcium: 9.6 (ref 8.7–10.7)

## 2021-01-20 ENCOUNTER — Encounter: Payer: Self-pay | Admitting: Orthopedic Surgery

## 2021-01-20 ENCOUNTER — Non-Acute Institutional Stay (SKILLED_NURSING_FACILITY): Payer: Medicare Other | Admitting: Orthopedic Surgery

## 2021-01-20 DIAGNOSIS — F02818 Dementia in other diseases classified elsewhere, unspecified severity, with other behavioral disturbance: Secondary | ICD-10-CM

## 2021-01-20 DIAGNOSIS — R296 Repeated falls: Secondary | ICD-10-CM | POA: Diagnosis not present

## 2021-01-20 DIAGNOSIS — E870 Hyperosmolality and hypernatremia: Secondary | ICD-10-CM | POA: Diagnosis not present

## 2021-01-20 NOTE — Progress Notes (Signed)
Location:  [facility] Room Number: 914-N Place of Service:  SNF (31) Provider:Annaya Bangert Adria Dill, NP   Patient Care Team: Virgie Dad, MD as PCP - General (Internal Medicine) Martinique, Jamie-Lee Galdamez, MD as Consulting Physician (Dermatology) Luberta Mutter, MD as Consulting Physician (Ophthalmology) Star Age, MD as Attending Physician (Neurology)  Extended Emergency Contact Information Primary Emergency Contact: [name] States of [place] Phone: [phone] Mobile Phone: [phone] Relation: Daughter Secondary Emergency Contact: Sharion Dove of [place] Phone: [phone] Work Phone: [phone] Mobile Phone: [phone] Relation: Son  Code Status:  DNR Goals of care: Advanced Directive information Advanced Directives 01/16/2021  Does Patient Have a Medical Advance Directive? Yes  Type of Advance Directive Out of facility DNR (pink MOST or yellow form)  Does patient want to make changes to medical advance directive? No - Patient declined  Copy of [place] in Chart? -  Would patient like information on creating a medical advance directive? -  Pre-existing out of facility DNR order (yellow form or pink MOST form) Yellow form placed in chart (order not valid for inpatient use)     Chief Complaint  Patient presents with   Acute Visit    Hypernatremia     HPI:  Pt is a 80 y.o. female seen today for an acute visit for hypernatremia.   She resides on the memory care unit at [facility] due to dementia with cerebral amyloid angiopathy.    Recent lab results revealed sodium level of 150 01/19/2021. Today, nursing reports fall without injury earlier this morning. VS stable. She was able to eat > 50% of breakfast and lunch. She has been drinking fluids without difficulty. Education given to patient, staff and sitter about drinking water. BMP scheduled to be redrawn 01/21/2021.    Past  Medical History:  Diagnosis Date   Anxiety    Per [facility] New Patient Packet    Bleb x 4    Cerebral amyloid angiopathy (CODE) 12/09/2018   Depression    Per [facility] New Patient Packet    Dermatitis due to solar radiation    Dyshidrosis 05/03/2007   Fuchs' corneal dystrophy    Per [facility] New Patient Packet    Glaucoma    Heart murmur    Hyperlipidemia    Per [facility] New Patient Packet    Malignant melanoma ([place])    Per [facility] New Patient Packet    Osteopenia    Pernicious anemia    Per [facility] New Patient Packet    Tinnitus    Past Surgical History:  Procedure Laterality Date   CORNEAL TRANSPLANT     Dr.Carlson-[facility]uke, Per [facility] New Patient Packet    MASTECTOMY     PLACEMENT OF BREAST IMPLANTS      Allergies  Allergen Reactions   Sulfonamide Derivatives Other (See Comments)    Doesn't remember    Outpatient Encounter Medications as of 01/20/2021  Medication Sig   aspirin EC 81 MG tablet Take 81 mg by mouth daily.   cyanocobalamin (,VITAMIN B-12,) 1000 MCG/ML injection INJECT 25mL MONTHLY   Dextromethorphan-quiNIDine (NUEDEXTA) 20-10 MG capsule Take 1 capsule by mouth every 12 (twelve) hours.   docusate sodium (COLACE) 100 MG capsule Take 100 mg by mouth daily.   LORazepam (ATIVAN) 0.5 MG tablet Take 1 tablet (0.5 mg total) by mouth daily as needed for anxiety.   LORazepam (ATIVAN) 0.5 MG tablet Take 1 tablet (0.5 mg total) by mouth 2 (two) times daily.  LUMIGAN 0.01 % SOLN Place 1 drop into both eyes daily.    mirtazapine (REMERON) 15 MG tablet Take 30 mg by mouth at bedtime.   ondansetron (ZOFRAN) 4 MG tablet Take 4 mg by mouth 4 (four) times daily as needed.   pravastatin (PRAVACHOL) 20 MG tablet Take 20 mg by mouth at bedtime.   UNABLE TO FIND Rabefen MD give 10 mL by mouth every 6 hours as needed for cough and congestion   [DISCONTINUED] mirtazapine (REMERON) 30 MG tablet Take 30 mg by mouth at bedtime.   No facility-administered encounter medications on file as of 01/20/2021.     Review of Systems  Unable to perform ROS: Dementia   Immunization History  Administered Date(s) Administered   Influenza Inj Mdck Quad Pf 11/12/2020   Influenza Split 10/25/2011   Influenza Whole 02/08/2005, 11/08/2008, 11/15/2009, 10/20/2010   Influenza, High Dose Seasonal PF 02/12/2014, 12/19/2015, 12/25/2017, 12/06/2018, 11/30/2019   Influenza-Unspecified 10/10/2014, 12/12/2016, 01/22/2018   Moderna Covid-19 Vaccine Bivalent Booster 65yrs & up 11/19/2020   Moderna SARS-COV2 Booster Vaccination 12/22/2019, 07/13/2020   Moderna Sars-Covid-2 Vaccination 02/20/2019, 03/20/2019   Pneumococcal Conjugate-13 03/08/2013   Pneumococcal Polysaccharide-23 05/03/2007   Td 02/09/2000   Tdap 10/20/2010, 11/13/2020   Zoster Recombinat (Shingrix) 04/25/2020, 06/25/2020   Pertinent  Health Maintenance Due  Topic Date Due   INFLUENZA VACCINE  Completed   DEXA SCAN  Completed   Fall Risk 02/18/2020 02/18/2020 02/19/2020 02/27/2020 09/26/2020  Falls in the past year? - - - 0 1  Was there an injury with Fall? - - - 0 1  Fall Risk Category Calculator - - - 0 2  Fall Risk Category - - - Low Moderate  Patient Fall Risk Level High fall risk High fall risk High fall risk - -  Patient at Risk for Falls Due to - - - - History of fall(s)  Fall risk Follow up - - - - Falls evaluation completed   Functional Status Survey:    Vitals:   01/20/21 1003  BP: 114/75  Pulse: 83  Resp: 16  Temp: 97.9 F (36.6 C)  SpO2: 97%  Weight: 130 lb 3.2 oz (59.1 kg)  Height: 5\' 1"  (1.549 m)   Body mass index is 24.6 kg/m. Physical Exam Vitals reviewed.  Constitutional:      General: She is not in acute distress. HENT:     Head: Normocephalic.  Cardiovascular:     Rate and Rhythm: Normal rate and regular rhythm.     Pulses: Normal pulses.     Heart sounds: Normal heart sounds. No murmur heard. Pulmonary:     Effort: Pulmonary effort is normal. No respiratory distress.     Breath sounds: Normal breath  sounds. No wheezing.  Abdominal:     General: Bowel sounds are normal. There is no distension.     Palpations: Abdomen is soft.     Tenderness: There is no abdominal tenderness.  Musculoskeletal:     Right lower leg: No edema.     Left lower leg: No edema.  Skin:    General: Skin is warm and dry.     Capillary Refill: Capillary refill takes less than 2 seconds.  Neurological:     General: No focal deficit present.     Mental Status: She is alert. Mental status is at baseline.     Motor: Weakness present.     Gait: Gait abnormal.     Comments: walker  Psychiatric:  Mood and Affect: Mood normal.        Behavior: Behavior normal.        Cognition and Memory: Memory is impaired.    Labs reviewed: Recent Labs    02/17/20 1322 02/17/20 1344 09/03/20 0000 01/19/21 0000  NA 141 143 145 150*  K 3.9 3.7 4.4 4.0  CL 106 107 105 108  CO2 23  --  26* 25*  GLUCOSE 140* 132*  --   --   BUN 16 19 22* 19  CREATININE 0.78 0.70 0.9 0.8  CALCIUM 9.4  --  9.6 9.6   Recent Labs    02/17/20 1322 09/03/20 0000 01/19/21 0000  AST 31 27 22   ALT 31 15 12   ALKPHOS 87 104 113  BILITOT <0.1*  --   --   PROT 7.8  --   --   ALBUMIN 4.5 4.4  --    Recent Labs    02/17/20 1322 02/17/20 1344 09/03/20 0000 01/19/21 0000  WBC 6.0  --  5.3 7.3  NEUTROABS 4.5  --   --   --   HGB 14.7 16.7* 13.1 13.7  HCT 47.7* 49.0* 39 43  MCV 87.7  --   --   --   PLT 255  --  397 219   Lab Results  Component Value Date   TSH 1.77 04/11/2017   Lab Results  Component Value Date   HGBA1C 5.7 (H) 02/18/2020   Lab Results  Component Value Date   CHOL 186 01/19/2021   HDL 69 01/19/2021   LDLCALC 105 01/19/2021   LDLDIRECT 165.1 03/08/2013   TRIG 65 01/19/2021   CHOLHDL 3.0 02/18/2020    Significant Diagnostic Results in last 30 days:  No results found.  Assessment/Plan 1. Hypernatremia - Na 150 01/19/2021 - encourage oral hydration with water- drinking well today - BMP scheduled  01/21/2021  2. Major neurocognitive disorder due to another medical condition with behavioral disturbance - intermittent agitation - remains on memory care unit - cont ativan - cont sitter  3. Falls frequently - fall this morning without injury - cont ambulation with walker - cont falls safety precautions    Family/ staff Communication: plan discussed with patient, nurse and sitter  Labs/tests ordered:  BMP 01/21/2021

## 2021-01-21 LAB — BASIC METABOLIC PANEL
BUN: 22 — AB (ref 4–21)
CO2: 26 — AB (ref 13–22)
Chloride: 108 (ref 99–108)
Creatinine: 0.8 (ref 0.5–1.1)
Glucose: 115
Potassium: 5.1 (ref 3.4–5.3)
Sodium: 146 (ref 137–147)

## 2021-01-21 LAB — COMPREHENSIVE METABOLIC PANEL: Calcium: 9.5 (ref 8.7–10.7)

## 2021-01-26 LAB — TSH: TSH: 2.19 (ref 0.41–5.90)

## 2021-01-29 ENCOUNTER — Encounter: Payer: Self-pay | Admitting: Orthopedic Surgery

## 2021-01-29 ENCOUNTER — Non-Acute Institutional Stay (SKILLED_NURSING_FACILITY): Payer: Medicare Other | Admitting: Orthopedic Surgery

## 2021-01-29 DIAGNOSIS — E870 Hyperosmolality and hypernatremia: Secondary | ICD-10-CM | POA: Diagnosis not present

## 2021-01-29 DIAGNOSIS — N3000 Acute cystitis without hematuria: Secondary | ICD-10-CM | POA: Diagnosis not present

## 2021-01-29 DIAGNOSIS — R296 Repeated falls: Secondary | ICD-10-CM | POA: Diagnosis not present

## 2021-01-29 DIAGNOSIS — F02818 Dementia in other diseases classified elsewhere, unspecified severity, with other behavioral disturbance: Secondary | ICD-10-CM

## 2021-01-29 NOTE — Progress Notes (Signed)
Location:   [place] Room Number: [place] of Service:  SNF ([phone]) Provider:  Windell Moulding, NP  Virgie Dad, MD  Patient Care Team: Virgie Dad, MD as PCP - General (Internal Medicine) Martin[place], Dwight Adamczak, MD as Consulting Physician (Dermatology) Luberta Mutter, MD as Consulting Physician (Ophthalmology) Star Age, MD as Attending Physician (Neurology)  Extended Emergency Contact Information Primary Emergency Contact: [name] [place] of [place] Phone: [phone] Mobile Phone: [phone] Relation: Daughter Secondary Emergency Contact: Sharion Dove of [place] Phone: [phone] Work Phone: [phone] Mobile Phone: [phone] Relation: Son  Code Status:  DNR Goals of care: Advanced Directive information Advanced Directives 01/29/2021  Does Patient Have a Medical Advance Directive? Yes  Type of Advance Directive Out of facility DNR (pink MOST or yellow form)  Does patient want to make changes to medical advance directive? No - Patient declined  Copy of [place] in Chart? -  Would patient like information on creating a medical advance directive? -  Pre-existing out of facility DNR order (yellow form or pink MOST form) Yellow form placed in chart (order not valid for inpatient use)     Chief Complaint  Patient presents with   Acute Visit    UTI    HPI:  Pt is a 80 y.o. female seen today for an acute visit for positive urine culture.   She resides on the memory care unit at [facility] due to dementia with cerebral amyloid angiopathy.   Seen by Dr. Casimiro Needle due to behaviors of increased agitation, anxiety and tearfulness. C&S ordered. Remeron decreased and Celexa started. Urine culture revealed E.Coli > 100,00 cfu/ml. Recently seen for hypernatremia Na 150 01/20/2021. Fluids encouraged by staff, rechecked Na 146 01/22/2021. She continues to drink fluids well.    Recent fall 12/12, she was found on the floor by staff. No apparent injury.     Past Medical History:  Diagnosis Date   Anxiety    Per [place] New Patient Packet    Bleb x 4    Cerebral amyloid angiopathy (CODE) 12/09/2018   Depression    Per [place] New Patient Packet    Dermatitis due to solar radiation    Dyshidrosis 05/03/2007   Fuchs' corneal dystrophy    Per [facility] New Patient Packet    Glaucoma    Heart murmur    Hyperlipidemia    Per [facility] New Patient Packet    Malignant melanoma ([place])    Per [place] New Patient Packet    Osteopenia    Pernicious anemia    Per [place] New Patient Packet    Tinnitus    Past Surgical History:  Procedure Laterality Date   CORNEAL TRANSPLANT     Dr.Carlson-Duke, Per [facility] New Patient Packet    MASTECTOMY     PLACEMENT OF BREAST IMPLANTS      Allergies  Allergen Reactions   Sulfonamide Derivatives Other (See Comments)    Doesn't remember    Allergies as of 01/29/2021       Reactions   Sulfonamide Derivatives Other (See Comments)   Doesn't remember        Medication List        Accurate as of January 29, 2021  1:24 PM. If you have any questions, ask your nurse or doctor.          STOP taking these medications    ondansetron 4 MG tablet Commonly known as: ZOFRAN Stopped by: Warren Lacy  Adria Dill, NP   UNABLE TO FIND Stopped by: Yvonna Alanis, NP       TAKE these medications    aspirin EC 81 MG tablet Take 81 mg by mouth daily.   ciprofloxacin 500 MG tablet Commonly known as: CIPRO Take 500 mg by mouth 2 (two) times daily.   citalopram 10 MG tablet Commonly known as: CELEXA Take 10 mg by mouth daily.   cyanocobalamin 1000 MCG/ML injection Commonly known as: (VITAMIN B-12) INJECT 32mL MONTHLY   Dextromethorphan-quiNIDine 20-10 MG capsule Commonly known as: NUEDEXTA Take 1 capsule by mouth every 12 (twelve) hours.   docusate sodium 100 MG capsule Commonly known as: COLACE Take 100 mg by mouth daily.   LORazepam 0.5 MG  tablet Commonly known as: ATIVAN Take 1 tablet (0.5 mg total) by mouth daily as needed for anxiety.   LORazepam 0.5 MG tablet Commonly known as: ATIVAN Take 1 tablet (0.5 mg total) by mouth 2 (two) times daily.   Lumigan 0.01 % Soln Generic drug: bimatoprost Place 1 drop into both eyes daily.   mirtazapine 15 MG tablet Commonly known as: REMERON Take 15 mg by mouth at bedtime.   pravastatin 20 MG tablet Commonly known as: PRAVACHOL Take 20 mg by mouth at bedtime.        Review of Systems  Unable to perform ROS: Dementia   Immunization History  Administered Date(s) Administered   Influenza Inj Mdck Quad Pf 11/12/2020   Influenza Split 10/25/2011   Influenza Whole 02/08/2005, 11/08/2008, 11/15/2009, 10/20/2010   Influenza, High Dose Seasonal PF 02/12/2014, 12/19/2015, 12/25/2017, 12/06/2018, 11/30/2019   Influenza-Unspecified 10/10/2014, 12/12/2016, 01/22/2018   Moderna Covid-19 Vaccine Bivalent Booster 81yrs & up 11/19/2020   Moderna SARS-COV2 Booster Vaccination 12/22/2019, 07/13/2020   Moderna Sars-Covid-2 Vaccination 02/20/2019, 03/20/2019   Pneumococcal Conjugate-13 03/08/2013   Pneumococcal Polysaccharide-23 05/03/2007   Td 02/09/2000   Tdap 10/20/2010, 11/13/2020   Zoster Recombinat (Shingrix) 04/25/2020, 06/25/2020   Pertinent  Health Maintenance Due  Topic Date Due   INFLUENZA VACCINE  Completed   DEXA SCAN  Completed   Fall Risk 02/18/2020 02/18/2020 02/19/2020 02/27/2020 09/26/2020  Falls in the past year? - - - 0 1  Was there an injury with Fall? - - - 0 1  Fall Risk Category Calculator - - - 0 2  Fall Risk Category - - - Low Moderate  Patient Fall Risk Level High fall risk High fall risk High fall risk - -  Patient at Risk for Falls Due to - - - - History of fall(s)  Fall risk Follow up - - - - Falls evaluation completed   Functional Status Survey:    Vitals:   01/29/21 1306  BP: 125/85  Pulse: 83  Resp: 20  Temp: (!) 97.2 F (36.2 C)  SpO2:  95%  Weight: 130 lb 3.2 oz (59.1 kg)  Height: 5\' 1"  (1.549 m)   Body mass index is 24.6 kg/m. Physical Exam Vitals reviewed.  Constitutional:      General: She is not in acute distress. HENT:     Head: Normocephalic.  Eyes:     General:        Right eye: No discharge.        Left eye: No discharge.  Neck:     Vascular: No carotid bruit.  Cardiovascular:     Rate and Rhythm: Normal rate and regular rhythm.     Pulses: Normal pulses.     Heart sounds: Normal heart sounds. No  murmur heard. Pulmonary:     Effort: Pulmonary effort is normal. No respiratory distress.     Breath sounds: Normal breath sounds. No wheezing.  Abdominal:     General: Bowel sounds are normal. There is no distension.     Palpations: Abdomen is soft.     Tenderness: There is no abdominal tenderness.  Musculoskeletal:     Cervical back: Normal range of motion.     Right lower leg: No edema.     Left lower leg: No edema.  Lymphadenopathy:     Cervical: No cervical adenopathy.  Skin:    General: Skin is warm and dry.     Capillary Refill: Capillary refill takes less than 2 seconds.  Neurological:     General: No focal deficit present.     Mental Status: She is alert. Mental status is at baseline.     Motor: Weakness present.     Gait: Gait abnormal.     Comments: Wheelchair/walker  Psychiatric:        Mood and Affect: Mood normal.        Behavior: Behavior normal.    Labs reviewed: Recent Labs    02/17/20 1322 02/17/20 1344 09/03/20 0000 01/19/21 0000  NA 141 143 145 150*  K 3.9 3.7 4.4 4.0  CL 106 107 105 108  CO2 23  --  26* 25*  GLUCOSE 140* 132*  --   --   BUN 16 19 22* 19  CREATININE 0.78 0.70 0.9 0.8  CALCIUM 9.4  --  9.6 9.6   Recent Labs    02/17/20 1322 09/03/20 0000 01/19/21 0000  AST 31 27 22   ALT 31 15 12   ALKPHOS 87 104 113  BILITOT <0.1*  --   --   PROT 7.8  --   --   ALBUMIN 4.5 4.4  --    Recent Labs    02/17/20 1322 02/17/20 1344 09/03/20 0000  01/19/21 0000  WBC 6.0  --  5.3 7.3  NEUTROABS 4.5  --   --   --   HGB 14.7 16.7* 13.1 13.7  HCT 47.7* 49.0* 39 43  MCV 87.7  --   --   --   PLT 255  --  397 219   Lab Results  Component Value Date   TSH 1.77 04/11/2017   Lab Results  Component Value Date   HGBA1C 5.7 (H) 02/18/2020   Lab Results  Component Value Date   CHOL 186 01/19/2021   HDL 69 01/19/2021   LDLCALC 105 01/19/2021   LDLDIRECT 165.1 03/08/2013   TRIG 65 01/19/2021   CHOLHDL 3.0 02/18/2020    Significant Diagnostic Results in last 30 days:  No results found.  Assessment/Plan 1. Acute cystitis without hematuria - E.Coli > 100,000 cfu/ml  - UA- +1 bacteria, 500 leukocytes, blood negative - start cipro 500 mg po bid x 7 days - encourage fluids  2. Hypernatremia - improved  - Na 146 01/22/2021 - encourage fluids  3. Major neurocognitive disorder due to another medical condition with behavioral disturbance - followed by Dr. Casimiro Needle - increased agitation, anxiety and tearfulness  - Remeron decreased to 15 mg qhs - Celexa recently started - cont Nudexta - cont ativan prn  4. Falls frequently - found sitting on floor 01/19/2021- no injury - cont memory care    Family/ staff Communication: plan discussed with nurse  Labs/tests ordered:  none

## 2021-02-03 ENCOUNTER — Emergency Department (HOSPITAL_BASED_OUTPATIENT_CLINIC_OR_DEPARTMENT_OTHER): Payer: Medicare Other

## 2021-02-03 ENCOUNTER — Inpatient Hospital Stay (HOSPITAL_BASED_OUTPATIENT_CLINIC_OR_DEPARTMENT_OTHER)
Admission: EM | Admit: 2021-02-03 | Discharge: 2021-03-11 | DRG: 086 | Disposition: E | Payer: Medicare Other | Source: Skilled Nursing Facility | Attending: Neurological Surgery | Admitting: Neurological Surgery

## 2021-02-03 ENCOUNTER — Other Ambulatory Visit: Payer: Self-pay

## 2021-02-03 ENCOUNTER — Encounter (HOSPITAL_BASED_OUTPATIENT_CLINIC_OR_DEPARTMENT_OTHER): Payer: Self-pay

## 2021-02-03 DIAGNOSIS — S0633AA Contusion and laceration of cerebrum, unspecified, with loss of consciousness status unknown, initial encounter: Secondary | ICD-10-CM | POA: Diagnosis present

## 2021-02-03 DIAGNOSIS — Z7982 Long term (current) use of aspirin: Secondary | ICD-10-CM

## 2021-02-03 DIAGNOSIS — Z803 Family history of malignant neoplasm of breast: Secondary | ICD-10-CM

## 2021-02-03 DIAGNOSIS — Z515 Encounter for palliative care: Secondary | ICD-10-CM

## 2021-02-03 DIAGNOSIS — Z8673 Personal history of transient ischemic attack (TIA), and cerebral infarction without residual deficits: Secondary | ICD-10-CM

## 2021-02-03 DIAGNOSIS — R52 Pain, unspecified: Secondary | ICD-10-CM

## 2021-02-03 DIAGNOSIS — S06340A Traumatic hemorrhage of right cerebrum without loss of consciousness, initial encounter: Principal | ICD-10-CM | POA: Diagnosis present

## 2021-02-03 DIAGNOSIS — S0093XA Contusion of unspecified part of head, initial encounter: Secondary | ICD-10-CM | POA: Diagnosis not present

## 2021-02-03 DIAGNOSIS — Z823 Family history of stroke: Secondary | ICD-10-CM

## 2021-02-03 DIAGNOSIS — E854 Organ-limited amyloidosis: Secondary | ICD-10-CM | POA: Diagnosis present

## 2021-02-03 DIAGNOSIS — S42291A Other displaced fracture of upper end of right humerus, initial encounter for closed fracture: Secondary | ICD-10-CM | POA: Diagnosis present

## 2021-02-03 DIAGNOSIS — R609 Edema, unspecified: Secondary | ICD-10-CM

## 2021-02-03 DIAGNOSIS — Z79899 Other long term (current) drug therapy: Secondary | ICD-10-CM

## 2021-02-03 DIAGNOSIS — R402242 Coma scale, best verbal response, confused conversation, at arrival to emergency department: Secondary | ICD-10-CM | POA: Diagnosis present

## 2021-02-03 DIAGNOSIS — W010XXA Fall on same level from slipping, tripping and stumbling without subsequent striking against object, initial encounter: Secondary | ICD-10-CM | POA: Diagnosis present

## 2021-02-03 DIAGNOSIS — Z882 Allergy status to sulfonamides status: Secondary | ICD-10-CM

## 2021-02-03 DIAGNOSIS — S0990XA Unspecified injury of head, initial encounter: Secondary | ICD-10-CM

## 2021-02-03 DIAGNOSIS — F03911 Unspecified dementia, unspecified severity, with agitation: Secondary | ICD-10-CM | POA: Diagnosis present

## 2021-02-03 DIAGNOSIS — Y92129 Unspecified place in nursing home as the place of occurrence of the external cause: Secondary | ICD-10-CM

## 2021-02-03 DIAGNOSIS — F419 Anxiety disorder, unspecified: Secondary | ICD-10-CM | POA: Diagnosis present

## 2021-02-03 DIAGNOSIS — M8580 Other specified disorders of bone density and structure, unspecified site: Secondary | ICD-10-CM | POA: Diagnosis present

## 2021-02-03 DIAGNOSIS — R739 Hyperglycemia, unspecified: Secondary | ICD-10-CM | POA: Diagnosis present

## 2021-02-03 DIAGNOSIS — Z20822 Contact with and (suspected) exposure to covid-19: Secondary | ICD-10-CM | POA: Diagnosis present

## 2021-02-03 DIAGNOSIS — Z66 Do not resuscitate: Secondary | ICD-10-CM | POA: Diagnosis present

## 2021-02-03 DIAGNOSIS — I680 Cerebral amyloid angiopathy: Secondary | ICD-10-CM | POA: Diagnosis present

## 2021-02-03 DIAGNOSIS — R402132 Coma scale, eyes open, to sound, at arrival to emergency department: Secondary | ICD-10-CM | POA: Diagnosis present

## 2021-02-03 DIAGNOSIS — E785 Hyperlipidemia, unspecified: Secondary | ICD-10-CM | POA: Diagnosis present

## 2021-02-03 DIAGNOSIS — G4733 Obstructive sleep apnea (adult) (pediatric): Secondary | ICD-10-CM | POA: Diagnosis present

## 2021-02-03 DIAGNOSIS — Z85820 Personal history of malignant melanoma of skin: Secondary | ICD-10-CM

## 2021-02-03 DIAGNOSIS — S01411A Laceration without foreign body of right cheek and temporomandibular area, initial encounter: Secondary | ICD-10-CM | POA: Diagnosis present

## 2021-02-03 DIAGNOSIS — I629 Nontraumatic intracranial hemorrhage, unspecified: Secondary | ICD-10-CM

## 2021-02-03 DIAGNOSIS — R402342 Coma scale, best motor response, flexion withdrawal, at arrival to emergency department: Secondary | ICD-10-CM | POA: Diagnosis present

## 2021-02-03 LAB — PROTIME-INR
INR: 1 (ref 0.8–1.2)
Prothrombin Time: 13.2 seconds (ref 11.4–15.2)

## 2021-02-03 LAB — CBC WITH DIFFERENTIAL/PLATELET
Abs Immature Granulocytes: 0.05 10*3/uL (ref 0.00–0.07)
Basophils Absolute: 0 10*3/uL (ref 0.0–0.1)
Basophils Relative: 0 %
Eosinophils Absolute: 0.1 10*3/uL (ref 0.0–0.5)
Eosinophils Relative: 1 %
HCT: 38 % (ref 36.0–46.0)
Hemoglobin: 12.2 g/dL (ref 12.0–15.0)
Immature Granulocytes: 0 %
Lymphocytes Relative: 7 %
Lymphs Abs: 0.8 10*3/uL (ref 0.7–4.0)
MCH: 27.7 pg (ref 26.0–34.0)
MCHC: 32.1 g/dL (ref 30.0–36.0)
MCV: 86.4 fL (ref 80.0–100.0)
Monocytes Absolute: 0.6 10*3/uL (ref 0.1–1.0)
Monocytes Relative: 5 %
Neutro Abs: 10.2 10*3/uL — ABNORMAL HIGH (ref 1.7–7.7)
Neutrophils Relative %: 87 %
Platelets: 201 10*3/uL (ref 150–400)
RBC: 4.4 MIL/uL (ref 3.87–5.11)
RDW: 13.6 % (ref 11.5–15.5)
WBC: 11.7 10*3/uL — ABNORMAL HIGH (ref 4.0–10.5)
nRBC: 0 % (ref 0.0–0.2)

## 2021-02-03 LAB — BASIC METABOLIC PANEL
Anion gap: 10 (ref 5–15)
BUN: 25 mg/dL — ABNORMAL HIGH (ref 8–23)
CO2: 26 mmol/L (ref 22–32)
Calcium: 8.8 mg/dL — ABNORMAL LOW (ref 8.9–10.3)
Chloride: 106 mmol/L (ref 98–111)
Creatinine, Ser: 0.86 mg/dL (ref 0.44–1.00)
GFR, Estimated: >60 mL/min (ref >60)
Glucose, Bld: 159 mg/dL — ABNORMAL HIGH (ref 70–99)
Potassium: 3.7 mmol/L (ref 3.5–5.1)
Sodium: 142 mmol/L (ref 135–145)

## 2021-02-03 LAB — RESP PANEL BY RT-PCR (FLU A&B, COVID) ARPGX2
Influenza A by PCR: NEGATIVE
Influenza B by PCR: NEGATIVE
SARS Coronavirus 2 by RT PCR: NEGATIVE

## 2021-02-03 MED ORDER — DEXAMETHASONE SODIUM PHOSPHATE 10 MG/ML IJ SOLN
6.0000 mg | Freq: Once | INTRAMUSCULAR | Status: AC
  Administered 2021-02-03: 21:00:00 6 mg via INTRAVENOUS
  Filled 2021-02-03: qty 1

## 2021-02-03 MED ORDER — SODIUM CHLORIDE 0.9 % IV SOLN
20.0000 ug | Freq: Once | INTRAVENOUS | Status: DC
  Filled 2021-02-03: qty 5

## 2021-02-03 MED ORDER — MANNITOL 25 % IV SOLN
INTRAVENOUS | Status: AC
  Filled 2021-02-03: qty 50

## 2021-02-03 MED ORDER — SODIUM CHLORIDE 0.9% IV SOLUTION: Freq: Once | INTRAVENOUS | Status: DC

## 2021-02-03 NOTE — ED Notes (Signed)
Pt family at bedside, MD at bedside.

## 2021-02-03 NOTE — ED Provider Notes (Signed)
[place] EMERGENCY DEPT Provider Note   CSN: [id number] Arrival date & time: 02/05/2021  2024     History Chief Complaint  Patient presents with   Lytle Michaels    Carmen [name] is a 80 y.o. female  Patient of resident at [facility].  Apparently had witnessed mechanical fall around 6 PM.  Patient became agitated, facility gave her Ativan. Unsure if before or after fall.  Brought here for further evaluation  Level 5 Caveat- Dementia- AMS  Yolanda Bonine is here with patient.  They are unsure if she was given Ativan prior to or after her fall.  Denies any anticoagulation use. Unsure of baseline mentation. Otherwise healthy and at baseline prior to fall.   Son here states patient is DNR at baseline however ok with meds and surgical intervention if needed.  At baseline she is conversational however turns to nonsensical speech.  Ambulatory at baseline.  HPI     Past Medical History:  Diagnosis Date   Anxiety    Per [place] New Patient Packet    Bleb x 4    Cerebral amyloid angiopathy (CODE) 12/09/2018   Depression    Per [place] New Patient Packet    Dermatitis due to solar radiation    Dyshidrosis 05/03/2007   Fuchs' corneal dystrophy    Per [facility] New Patient Packet    Glaucoma    Heart murmur    Hyperlipidemia    Per [facility] New Patient Packet    Malignant melanoma ([place])    Per [place] Patient Packet    Osteopenia    Pernicious anemia    Per [facility] New Patient Packet    Tinnitus     Patient Active Problem List   Diagnosis Date Noted   Constipation 11/14/2020   PBA (pseudobulbar affect) 11/14/2020   Acute ischemic stroke ([place]) 02/17/2020   Major neurocognitive disorder, due to another medical condition, without behavioral disturbance, moderate 03/03/2019   Panic attacks 12/09/2018   Cerebral amyloid angiopathy (CODE) 12/09/2018   OSA on CPAP 12/09/2018   DNR (do not resuscitate) 12/09/2018   Situational anxiety 03/07/2018   Pernicious anemia 04/19/2017    Situational depression 04/27/2016   Contracture of palmar fascia 07/23/2015   History of malignant melanoma of skin 02/12/2014   Cataract 10/23/2013   Family history of breast cancer 10/23/2013   Fuchs' corneal dystrophy 10/23/2013   Glaucoma 10/23/2013   Mixed hyperlipidemia 03/21/2013   Rhytides 12/12/2012    Past Surgical History:  Procedure Laterality Date   CORNEAL TRANSPLANT     Dr.Carlson-Duke, Per [facility] New Patient Packet    MASTECTOMY     PLACEMENT OF BREAST IMPLANTS       OB History   No obstetric history on file.     Family History  Problem Relation Age of Onset   Breast cancer Mother    Stroke Other    Heart disease Other    Breast cancer Other    Dysphagia Neg Hx     Social History   Tobacco Use   Smoking status: Never   Smokeless tobacco: Never  Vaping Use   Vaping Use: Never used  Substance Use Topics   Alcohol use: Yes    Comment: 1 drink weekly per [facility] New Patient Packet 12/05/2018    Drug use: Never    Home Medications Prior to Admission medications   Medication Sig Start Date End Date Taking? Authorizing Provider  aspirin EC 81 MG tablet Take 81 mg by mouth daily.  [provider]  ciprofloxacin (CIPRO) 500 MG tablet Take 500 mg by mouth 2 (two) times daily.    [provider]  citalopram (CELEXA) 10 MG tablet Take 10 mg by mouth daily.    [provider]  cyanocobalamin (,VITAMIN B-12,) 1000 MCG/ML injection INJECT 43mL MONTHLY 03/27/19   Reed, Tiffany L, DO  Dextromethorphan-quiNIDine (NUEDEXTA) 20-10 MG capsule Take 1 capsule by mouth every 12 (twelve) hours.    [provider]  docusate sodium (COLACE) 100 MG capsule Take 100 mg by mouth daily.    [provider]  LORazepam (ATIVAN) 0.5 MG tablet Take 1 tablet (0.5 mg total) by mouth daily as needed for anxiety. 06/19/20   Royal Hawthorn, NP  LORazepam (ATIVAN) 0.5 MG tablet Take 1 tablet (0.5 mg total) by mouth 2 (two) times daily. 12/08/20    Wert, Margreta Journey, NP  LUMIGAN 0.01 % SOLN Place 1 drop into both eyes daily.  08/28/10   [provider]  mirtazapine (REMERON) 15 MG tablet Take 15 mg by mouth at bedtime.    [provider]  pravastatin (PRAVACHOL) 20 MG tablet Take 20 mg by mouth at bedtime.    [provider]    Allergies    Sulfonamide derivatives  Review of Systems   Review of Systems  Unable to perform ROS: Dementia   Physical Exam Updated Vital Signs BP 135/75    Pulse 74    Temp 98.6 F (37 C) (Oral)    Resp 16    Ht 5\' 1"  (1.549 m)    Wt 59.1 kg    SpO2 96%    BMI 24.62 kg/m   Physical Exam Vitals and nursing note reviewed.  Constitutional:      General: She is not in acute distress.    Appearance: She is well-developed. She is not ill-appearing.  HENT:     Head: Normocephalic. Laceration present.     Comments: Right sided superior periorbital hematoma with 3 mm laceration, nonbleeding. Eyes:     Pupils: Pupils are equal, round, and reactive to light.     Comments: Pupils 3 mm, reactive to light bilaterally  Cardiovascular:     Rate and Rhythm: Normal rate.     Pulses: Normal pulses.          Radial pulses are 2+ on the right side and 2+ on the left side.       Dorsalis pedis pulses are 2+ on the right side and 2+ on the left side.     Heart sounds: Normal heart sounds.  Pulmonary:     Effort: Pulmonary effort is normal. No respiratory distress.     Breath sounds: Normal breath sounds.  Abdominal:     General: Bowel sounds are normal. There is no distension.     Palpations: Abdomen is soft.  Musculoskeletal:        General: Normal range of motion.     Cervical back: Normal range of motion.     Comments: Withdraws to pain Spontaneously moves extremities No obvious shortening or rotation of legs  Skin:    General: Skin is warm and dry.     Comments: Hematoma right periorbital area  Neurological:     General: No focal deficit present.     Mental Status: She is  lethargic.     Comments: Pupils 3 mm, reactive to light Somnolent however withdraws to pain, will squeeze hands bilaterally  Psychiatric:        Mood and Affect:  Mood normal.   ED Results / Procedures / Treatments   Labs (all labs ordered are listed, but only abnormal results are displayed) Labs Reviewed  CBC WITH DIFFERENTIAL/PLATELET - Abnormal; Notable for the following components:      Result Value   WBC 11.7 (*)    Neutro Abs 10.2 (*)    All other components within normal limits  BASIC METABOLIC PANEL - Abnormal; Notable for the following components:   Glucose, Bld 159 (*)    BUN 25 (*)    Calcium 8.8 (*)    All other components within normal limits  RESP PANEL BY RT-PCR (FLU A&B, COVID) ARPGX2  PROTIME-INR    EKG None  Radiology CT Head Wo Contrast  Result Date: 01/18/2021 CLINICAL DATA:  Trauma. EXAM: CT HEAD WITHOUT CONTRAST TECHNIQUE: Contiguous axial images were obtained from the base of the skull through the vertex without intravenous contrast. COMPARISON:  Head CT dated 02/17/2020. FINDINGS: Brain: There is a large area of intraparenchymal hemorrhage in the right frontal lobe measuring 6.5 x 4.7 cm in greatest axial dimension approximately 4 cm in craniocaudal length. There is associated mild surrounding edema. Small subarachnoid hemorrhage as well as areas of cortical contusion noted in the right frontal lobe. There is mass effect and compression of the anterior horn of the right lateral ventricle. There is approximately 7 mm right to left midline shift of the septum pellucidum. There is moderate age-related atrophy and chronic microvascular ischemic changes. Additional areas of extra-axial hemorrhage over the left parietal convexity, likely subarachnoid hemorrhage. Vascular: No hyperdense vessel or unexpected calcification. Skull: Normal. Negative for fracture or focal lesion. Sinuses/Orbits: There is opacification of the majority of the left sphenoid sinus. The remainder  of the visualized paranasal sinuses and mastoid air cells are clear. Other: Right forehead and periorbital hematoma. IMPRESSION: 1. Large right frontal hemorrhage with associated mass effect and approximately 7 mm right to left midline shift. 2. Smaller left parietal subarachnoid hemorrhage. 3. Moderate age-related atrophy and chronic microvascular ischemic changes. These results were called by telephone at the time of interpretation on 02/07/2021 at 9:06 pm to provider [facility] , who verbally acknowledged these results. Electronically Signed   By: Anner Crete M.D.   On: 01/10/2021 21:10   CT Cervical Spine Wo Contrast  Result Date: 01/21/2021 CLINICAL DATA:  Status post fall. EXAM: CT CERVICAL SPINE WITHOUT CONTRAST TECHNIQUE: Multidetector CT imaging of the cervical spine was performed without intravenous contrast. Multiplanar CT image reconstructions were also generated. COMPARISON:  None. FINDINGS: Alignment: Approximately 2 mm anterolisthesis of the C4 vertebral body is noted on C5. Skull base and vertebrae: No acute fracture. Chronic and degenerative changes are seen involving the body and tip of the dens as well as the adjacent portion of the anterior arch of C1. Soft tissues and spinal canal: No prevertebral fluid or swelling. No visible canal hematoma. Disc levels: Moderate severity endplate sclerosis and mild anterior osteophyte formation are seen at the level of C6-C7. Mild anterior osteophyte formation is also seen at the level of C4-C5. There is marked severity narrowing of the anterior atlantoaxial articulation. Moderate to marked severity intervertebral disc space narrowing is seen at C6-C7. Bilateral multilevel marked severity facet joint hypertrophy is noted. Upper chest: Negative. Other: None. IMPRESSION: 1. No acute fracture within the cervical spine. 2. Approximately 2 mm anterolisthesis of the C4 vertebral body on C5. 3. Moderate to marked severity multilevel degenerative  changes, most prominent at the level of C6-C7. Electronically Signed  By: Virgina Norfolk M.D.   On: 01/31/2021 21:26   CT Maxillofacial Wo Contrast  Result Date: 01/31/2021 CLINICAL DATA:  Trauma. EXAM: CT MAXILLOFACIAL WITHOUT CONTRAST TECHNIQUE: Multidetector CT imaging of the maxillofacial structures was performed. Multiplanar CT image reconstructions were also generated. COMPARISON:  Head CT dated 01/20/2021. FINDINGS: Osseous: No acute fracture.  No mandibular subluxation. Orbits: The globes and retro-orbital fat are preserved. Sinuses: Opacification of the left sphenoid sinus. The remainder of the visualized paranasal sinuses and mastoid air cells are clear. Soft tissues: Right periorbital hematoma. Limited intracranial: See head CT report. IMPRESSION: 1. No acute facial bone fractures. 2. Right periorbital hematoma. Electronically Signed   By: Anner Crete M.D.   On: 01/11/2021 21:18    Procedures .Critical Care Performed by: Nettie Elm, PA-C Authorized by: Nettie Elm, PA-C   Critical care provider statement:    Critical care time (minutes):  35   Critical care time was exclusive of:  Separately billable procedures and treating other patients and teaching time   Critical care was necessary to treat or prevent imminent or life-threatening deterioration of the following conditions:  CNS failure or compromise   Critical care was time spent personally by me on the following activities:  Development of treatment plan with patient or surrogate, discussions with consultants, evaluation of patient's response to treatment, examination of patient, ordering and review of laboratory studies, ordering and review of radiographic studies, ordering and performing treatments and interventions, pulse oximetry, re-evaluation of patient's condition, review of old charts and obtaining history from patient or surrogate   Care discussed with: admitting provider     Medications Ordered in  ED Medications  desmopressin (DDAVP) 20 mcg in sodium chloride 0.9 % 50 mL IVPB (has no administration in time range)  dexamethasone (DECADRON) injection 6 mg (6 mg Intravenous Given 01/26/2021 2120)   ED Course  I have reviewed the triage vital signs and the nursing notes.  Pertinent labs & imaging results that were available during my care of the patient were reviewed by me and considered in my medical decision making (see chart for details).  80 year old here for evaluation after witnessed fall at facility approximately 3 hours PTA.  Unfortunately got some Ativan at Facility so Neuro exam limited on arrival  She is somnolent on exam however does withdrawal to pain, will occasional open eyes, moan to painful stimuli.  Pupils 3 mm equal, reactive to light.   Imaging personally reviewed and interpreted:  CBC leukocytosis at 11.7 BMP hyperglycemia at 159 INR 1.0 CT head with large head bleed with 7 mm shift>>GCS 10 currently CT max face with hematoma CT cervical with degenerative changes  Will start Decadron, as well as consult with neurosurgery  [place] APP with Neurosurgery, Rec ED to ED transfer and page on patient arrival.  Patient critically ill with large intracranial bleed with midline shift.  Will be admitted for further management and evaluation  Discussed with Dr. Dina Rich EDP with [facility] who agrees to accept patient in transfer.     MDM Rules/Calculators/A&P                             Final Clinical Impression(s) / ED Diagnoses Final diagnoses:  Injury of head, initial encounter  Intracranial bleed [facility])    Rx / DC Orders ED Discharge Orders     None        Clark Clowdus A, PA-C 01/14/2021 2217  Jeanell Sparrow, DO 02/05/21 [phone]

## 2021-02-03 NOTE — ED Notes (Signed)
Pt departed with [facility] on stretcher. VSS at time of departure.

## 2021-02-03 NOTE — ED Notes (Signed)
Called and spoke with Lennette Bihari, pharmacist at [facility], to inform that EMS is here to transport pt to ED for medication preparation of desmopressin

## 2021-02-03 NOTE — ED Triage Notes (Addendum)
Pt brought to the ED by [facility] from [facility]. EMS reports that pt had a witnessed fall. Pt does have a large hematoma above the right eye. No LOC or use of blood thinners. Pt does have dementia at baseline and facility reports pt to be at baseline. Facility gave 0.5mg  of ativan prior to the fall. Pt oriented to self

## 2021-02-03 NOTE — ED Triage Notes (Signed)
Pt is a tx from [place] [facility], known ich, DNR form at bedside.

## 2021-02-03 NOTE — ED Notes (Signed)
Further updated son on status of transport services. No questions at this time. Pt remains stable at this time.

## 2021-02-03 NOTE — H&P (Signed)
Carmen [name] is an 80 y.o. female.   Chief Complaint: ICH HPI: Carmen [name] is a 80 y.o. female who is BIB EMS to [facility] after a witnessed mechanical fall. She has a PmHx significant for dementia with cerebral amyloid angiopathy, anxiety, depression, osteopenia, and HLD. Denies any anticoagulation use. Patient is a resident at [facility] facility. She was reported to have tripped and fell around 6:00 pm today. Patient became agitated, facility gave her Ativan. Unsure if administered prior to or after the fall. Facility reports patient at her baseline following fall. At baseline, she is conversational however turns to nonsensical speech. Per her son, the patient is typically lethargic in the evenings after receiving her evening meds. Her fall was was about 20 minutes after her med admin. Ambulatory at baseline. CTH while in the ED revealed a large right frontal IPH with mass effect and MLS, as well as a small left parietal SAH. Neurosurgery consult was requested. NSX requested patient to be transferred to [facility] for further neurological observation and potential intervention.   Past Medical History:  Diagnosis Date   Anxiety    Per [facility] New Patient Packet    Bleb x 4    Cerebral amyloid angiopathy (CODE) 12/09/2018   Depression    Per [facility] New Patient Packet    Dermatitis due to solar radiation    Dyshidrosis 05/03/2007   Fuchs' corneal dystrophy    Per [facility] Patient Packet    Glaucoma    Heart murmur    Hyperlipidemia    Per [facility] New Patient Packet    Malignant melanoma ([place])    Per [facility] Patient Packet    Osteopenia    Pernicious anemia    Per [facility] New Patient Packet    Tinnitus     Past Surgical History:  Procedure Laterality Date   CORNEAL TRANSPLANT     Dr.Carlson-[facility], Per [facility] New Patient Packet    MASTECTOMY     PLACEMENT OF BREAST IMPLANTS      Family History  Problem Relation Age of Onset   Breast cancer Mother    Stroke Other    Heart  disease Other    Breast cancer Other    Dysphagia Neg Hx    Social History:  reports that she has never smoked. She has never used smokeless tobacco. She reports current alcohol use. She reports that she does not use drugs.  Allergies:  Allergies  Allergen Reactions   Sulfonamide Derivatives Other (See Comments)    Doesn't remember    (Not in a hospital admission)   Results for orders placed or performed during the hospital encounter of 01/24/2021 (from the past 48 hour(s))  CBC with Differential     Status: Abnormal   Collection Time: 02/04/2021  9:18 PM  Result Value Ref Range   WBC 11.7 (H) 4.0 - 10.5 K/uL   RBC 4.40 3.87 - 5.11 MIL/uL   Hemoglobin 12.2 12.0 - 15.0 g/dL   HCT 38.0 36.0 - 46.0 %   MCV 86.4 80.0 - 100.0 fL   MCH 27.7 26.0 - 34.0 pg   MCHC 32.1 30.0 - 36.0 g/dL   RDW 13.6 11.5 - 15.5 %   Platelets 201 150 - 400 K/uL   nRBC 0.0 0.0 - 0.2 %   Neutrophils Relative % 87 %   Neutro Abs 10.2 (H) 1.7 - 7.7 K/uL   Lymphocytes Relative 7 %   Lymphs Abs 0.8 0.7 - 4.0 K/uL  Monocytes Relative 5 %   Monocytes Absolute 0.6 0.1 - 1.0 K/uL   Eosinophils Relative 1 %   Eosinophils Absolute 0.1 0.0 - 0.5 K/uL   Basophils Relative 0 %   Basophils Absolute 0.0 0.0 - 0.1 K/uL   Immature Granulocytes 0 %   Abs Immature Granulocytes 0.05 0.00 - 0.07 K/uL    Comment: Performed at [facility], [address] [place], [place] [address]  Basic metabolic panel     Status: Abnormal   Collection Time: 01/22/2021  9:18 PM  Result Value Ref Range   Sodium 142 135 - 145 mmol/L   Potassium 3.7 3.5 - 5.1 mmol/L   Chloride 106 98 - 111 mmol/L   CO2 26 22 - 32 mmol/L   Glucose, Bld 159 (H) 70 - 99 mg/dL    Comment: Glucose reference range applies only to samples taken after fasting for at least 8 hours.   BUN 25 (H) 8 - 23 mg/dL   Creatinine, Ser 0.86 0.44 - 1.00 mg/dL   Calcium 8.8 (L) 8.9 - 10.3 mg/dL   GFR, Estimated >60 >60 mL/min    Comment:  (NOTE) Calculated using the CKD-EPI Creatinine Equation (2021)    Anion gap 10 5 - 15    Comment: Performed at [facility], [address], [place], [place] [address]  Protime-INR     Status: None   Collection Time: 02/02/2021  9:18 PM  Result Value Ref Range   Prothrombin Time 13.2 11.4 - 15.2 seconds   INR 1.0 0.8 - 1.2    Comment: (NOTE) INR goal varies based on device and disease states. Performed at [facility], [address] [place], [place] [address]   Resp Panel by RT-PCR (Flu A&B, Covid) Nasopharyngeal Swab     Status: None   Collection Time: 01/29/2021  9:18 PM   Specimen: Nasopharyngeal Swab; Nasopharyngeal(NP) swabs in vial transport medium  Result Value Ref Range   SARS Coronavirus 2 by RT PCR NEGATIVE NEGATIVE    Comment: (NOTE) SARS-CoV-2 target nucleic acids are NOT DETECTED.  The SARS-CoV-2 RNA is generally detectable in upper respiratory specimens during the acute phase of infection. The lowest concentration of SARS-CoV-2 viral copies this assay can detect is 138 copies/mL. A negative result does not preclude SARS-Cov-2 infection and should not be used as the sole basis for treatment or other patient management decisions. A negative result may occur with  improper specimen collection/handling, submission of specimen other than nasopharyngeal swab, presence of viral mutation(s) within the areas targeted by this assay, and inadequate number of viral copies(<138 copies/mL). A negative result must be combined with clinical observations, patient history, and epidemiological information. The expected result is Negative.  Fact Sheet for Patients:  EntrepreneurPulse.com.au  Fact Sheet for Healthcare Providers:  IncredibleEmployment.be  This test is no t yet approved or cleared by the [place] FDA and  has been authorized for detection and/or diagnosis of SARS-CoV-2 by FDA under an  Emergency Use Authorization (EUA). This EUA will remain  in effect (meaning this test can be used) for the duration of the COVID-19 declaration under Section 564(b)(1) of the Act, 21 U.S.C.section 360bbb-3(b)(1), unless the authorization is terminated  or revoked sooner.       Influenza A by PCR NEGATIVE NEGATIVE   Influenza B by PCR NEGATIVE NEGATIVE    Comment: (NOTE) The Xpert Xpress SARS-CoV-2/FLU/RSV plus assay is intended as an aid in the diagnosis of influenza from Nasopharyngeal swab specimens and  should not be used as a sole basis for treatment. Nasal washings and aspirates are unacceptable for Xpert Xpress SARS-CoV-2/FLU/RSV testing.  Fact Sheet for Patients: EntrepreneurPulse.com.au  Fact Sheet for Healthcare Providers: IncredibleEmployment.be  This test is not yet approved or cleared by the [place] FDA and has been authorized for detection and/or diagnosis of SARS-CoV-2 by FDA under an Emergency Use Authorization (EUA). This EUA will remain in effect (meaning this test can be used) for the duration of the COVID-19 declaration under Section 564(b)(1) of the Act, 21 U.S.C. section 360bbb-3(b)(1), unless the authorization is terminated or revoked.  Performed at [facility], [address] [place], [place] [address]    CT Head Wo Contrast  Result Date: 01/14/2021 CLINICAL DATA:  Trauma. EXAM: CT HEAD WITHOUT CONTRAST TECHNIQUE: Contiguous axial images were obtained from the base of the skull through the vertex without intravenous contrast. COMPARISON:  Head CT dated 02/17/2020. FINDINGS: Brain: There is a large area of intraparenchymal hemorrhage in the right frontal lobe measuring 6.5 x 4.7 cm in greatest axial dimension approximately 4 cm in craniocaudal length. There is associated mild surrounding edema. Small subarachnoid hemorrhage as well as areas of cortical contusion noted in the right frontal lobe.  There is mass effect and compression of the anterior horn of the right lateral ventricle. There is approximately 7 mm right to left midline shift of the septum pellucidum. There is moderate age-related atrophy and chronic microvascular ischemic changes. Additional areas of extra-axial hemorrhage over the left parietal convexity, likely subarachnoid hemorrhage. Vascular: No hyperdense vessel or unexpected calcification. Skull: Normal. Negative for fracture or focal lesion. Sinuses/Orbits: There is opacification of the majority of the left sphenoid sinus. The remainder of the visualized paranasal sinuses and mastoid air cells are clear. Other: Right forehead and periorbital hematoma. IMPRESSION: 1. Large right frontal hemorrhage with associated mass effect and approximately 7 mm right to left midline shift. 2. Smaller left parietal subarachnoid hemorrhage. 3. Moderate age-related atrophy and chronic microvascular ischemic changes. These results were called by telephone at the time of interpretation on 01/31/2021 at 9:06 pm to provider [facility] , who verbally acknowledged these results. Electronically Signed   By: Anner Crete M.D.   On: 01/10/2021 21:10   CT Cervical Spine Wo Contrast  Result Date: 01/13/2021 CLINICAL DATA:  Status post fall. EXAM: CT CERVICAL SPINE WITHOUT CONTRAST TECHNIQUE: Multidetector CT imaging of the cervical spine was performed without intravenous contrast. Multiplanar CT image reconstructions were also generated. COMPARISON:  None. FINDINGS: Alignment: Approximately 2 mm anterolisthesis of the C4 vertebral body is noted on C5. Skull base and vertebrae: No acute fracture. Chronic and degenerative changes are seen involving the body and tip of the dens as well as the adjacent portion of the anterior arch of C1. Soft tissues and spinal canal: No prevertebral fluid or swelling. No visible canal hematoma. Disc levels: Moderate severity endplate sclerosis and mild anterior  osteophyte formation are seen at the level of C6-C7. Mild anterior osteophyte formation is also seen at the level of C4-C5. There is marked severity narrowing of the anterior atlantoaxial articulation. Moderate to marked severity intervertebral disc space narrowing is seen at C6-C7. Bilateral multilevel marked severity facet joint hypertrophy is noted. Upper chest: Negative. Other: None. IMPRESSION: 1. No acute fracture within the cervical spine. 2. Approximately 2 mm anterolisthesis of the C4 vertebral body on C5. 3. Moderate to marked severity multilevel degenerative changes, most prominent at the level of C6-C7. Electronically Signed   By:  Virgina Norfolk M.D.   On: 02/02/2021 21:26   CT Maxillofacial Wo Contrast  Result Date: 01/27/2021 CLINICAL DATA:  Trauma. EXAM: CT MAXILLOFACIAL WITHOUT CONTRAST TECHNIQUE: Multidetector CT imaging of the maxillofacial structures was performed. Multiplanar CT image reconstructions were also generated. COMPARISON:  Head CT dated 01/31/2021. FINDINGS: Osseous: No acute fracture.  No mandibular subluxation. Orbits: The globes and retro-orbital fat are preserved. Sinuses: Opacification of the left sphenoid sinus. The remainder of the visualized paranasal sinuses and mastoid air cells are clear. Soft tissues: Right periorbital hematoma. Limited intracranial: See head CT report. IMPRESSION: 1. No acute facial bone fractures. 2. Right periorbital hematoma. Electronically Signed   By: Anner Crete M.D.   On: 01/31/2021 21:18    ROS: Unable to complete due to acuity of condition and dementia   Blood pressure 133/87, pulse 81, temperature 97.9 F (36.6 C), temperature source Oral, resp. rate 15, height 5\' 1"  (1.549 m), weight 59.1 kg, SpO2 95 %.   Physical Exam Vitals and nursing note reviewed.  Constitutional:      General: She is not in acute distress.    Appearance: Normal appearance. She is normal weight.  HENT:     Head: Normocephalic.     Comments:  Laceration present. Right sided superior periorbital hematoma with 3 mm laceration, nonbleeding    Nose: Nose normal.     Mouth/Throat:     Mouth: Mucous membranes are moist.     Pharynx: Oropharynx is clear.  Eyes:     Extraocular Movements: Extraocular movements intact.     Conjunctiva/sclera: Conjunctivae normal.  Cardiovascular:     Rate and Rhythm: Normal rate and regular rhythm.     Pulses: Normal pulses.     Heart sounds: Normal heart sounds.  Pulmonary:     Effort: Pulmonary effort is normal. No respiratory distress.     Breath sounds: Normal breath sounds. No wheezing.  Abdominal:     General: Abdomen is flat. Bowel sounds are normal.     Palpations: Abdomen is soft.  Musculoskeletal:        General: No swelling, tenderness, deformity or signs of injury.     Cervical back: No rigidity or tenderness.     Right lower leg: No edema.     Left lower leg: No edema.  Skin:    General: Skin is warm and dry.     Coloration: Skin is not jaundiced.     Findings: No bruising or lesion.  Neurological:  Patient is somnolent and unable to follow commands.  She is mostly nonverbal, MAE spontaneously Withdrawals from noxious stimuli  No eye opening. Pupils 31mm brisk, PERRLA General: No focal deficit present.     Mental Status: She is disoriented.     Cranial Nerves: No cranial nerve deficit.     Motor: No weakness.     Deep Tendon Reflexes: Reflexes normal.  Plantars: Right: downgoing             Left: downgoing         Assessment/Plan 80 year old female presented to the ED after sustaining a witnessed mechanical fall with head trauma. Her neurological exam revealed somnolence. She is protecting her airway.  She has a hematoma in the right periorbital region. Per the patient's son, she is usually in bed early and is difficult to arouse once sleeping. The son does not feel she is very altered from her baseline. Tomorrow morning her normal behavior would be up and being  interactive. CT head revealed  a large right frontal IPH with mass effect and approximately 7 mm right to left midline shift, as well as a small left parietal SAH. CT maxillofacial revealed no acute facial bone fractures, right periorbital hematoma. CT cervical with degenerative and arthritic changes, C4 on C5 anterolisthesis, no acute fractures. Patient was transferred to [facility] for close neurological monitoring as she is high risk for neurological deterioration. She is not currently a surgical candidate. However, if her neurological status declines, she might need surgical intervention. Will plan to keep NPO for now.   Per the patient's son, the patient is a DNR. He stated that he would like to peruse full medical care in relation to her [place]. He would not like CPR in the event of cardiac arrest. He would want comfort measures. If intubation is needed, the decision would need to be made by the family members. Per the son, intubation would be considered for short durations. They do not wasn't long-term vent care. Will maintain DNR status per advanced directive and    -NPO -Neuro Icu -Frequent neurochecks -Repeat CT head in the morning -Transfuse platelets -Keppra  -DNR   Marvis Moeller, DNP, NP-C 01/23/2021 10:59 PM

## 2021-02-03 NOTE — ED Notes (Signed)
Pt changed into a gown.

## 2021-02-04 ENCOUNTER — Inpatient Hospital Stay (HOSPITAL_COMMUNITY): Payer: Medicare Other

## 2021-02-04 DIAGNOSIS — I680 Cerebral amyloid angiopathy: Secondary | ICD-10-CM | POA: Diagnosis present

## 2021-02-04 DIAGNOSIS — E854 Organ-limited amyloidosis: Secondary | ICD-10-CM | POA: Diagnosis present

## 2021-02-04 DIAGNOSIS — R402242 Coma scale, best verbal response, confused conversation, at arrival to emergency department: Secondary | ICD-10-CM | POA: Diagnosis present

## 2021-02-04 DIAGNOSIS — Z7189 Other specified counseling: Secondary | ICD-10-CM | POA: Diagnosis not present

## 2021-02-04 DIAGNOSIS — Z7982 Long term (current) use of aspirin: Secondary | ICD-10-CM | POA: Diagnosis not present

## 2021-02-04 DIAGNOSIS — R402342 Coma scale, best motor response, flexion withdrawal, at arrival to emergency department: Secondary | ICD-10-CM | POA: Diagnosis present

## 2021-02-04 DIAGNOSIS — S0093XA Contusion of unspecified part of head, initial encounter: Secondary | ICD-10-CM | POA: Diagnosis present

## 2021-02-04 DIAGNOSIS — Z803 Family history of malignant neoplasm of breast: Secondary | ICD-10-CM | POA: Diagnosis not present

## 2021-02-04 DIAGNOSIS — Z85820 Personal history of malignant melanoma of skin: Secondary | ICD-10-CM | POA: Diagnosis not present

## 2021-02-04 DIAGNOSIS — S0990XA Unspecified injury of head, initial encounter: Secondary | ICD-10-CM | POA: Diagnosis not present

## 2021-02-04 DIAGNOSIS — G4733 Obstructive sleep apnea (adult) (pediatric): Secondary | ICD-10-CM | POA: Diagnosis present

## 2021-02-04 DIAGNOSIS — F03911 Unspecified dementia, unspecified severity, with agitation: Secondary | ICD-10-CM | POA: Diagnosis present

## 2021-02-04 DIAGNOSIS — Y92129 Unspecified place in nursing home as the place of occurrence of the external cause: Secondary | ICD-10-CM | POA: Diagnosis not present

## 2021-02-04 DIAGNOSIS — S01411A Laceration without foreign body of right cheek and temporomandibular area, initial encounter: Secondary | ICD-10-CM | POA: Diagnosis present

## 2021-02-04 DIAGNOSIS — Z823 Family history of stroke: Secondary | ICD-10-CM | POA: Diagnosis not present

## 2021-02-04 DIAGNOSIS — Z20822 Contact with and (suspected) exposure to covid-19: Secondary | ICD-10-CM | POA: Diagnosis present

## 2021-02-04 DIAGNOSIS — R402132 Coma scale, eyes open, to sound, at arrival to emergency department: Secondary | ICD-10-CM | POA: Diagnosis present

## 2021-02-04 DIAGNOSIS — Z79899 Other long term (current) drug therapy: Secondary | ICD-10-CM | POA: Diagnosis not present

## 2021-02-04 DIAGNOSIS — S0633AA Contusion and laceration of cerebrum, unspecified, with loss of consciousness status unknown, initial encounter: Secondary | ICD-10-CM | POA: Diagnosis present

## 2021-02-04 DIAGNOSIS — E785 Hyperlipidemia, unspecified: Secondary | ICD-10-CM | POA: Diagnosis present

## 2021-02-04 DIAGNOSIS — M8580 Other specified disorders of bone density and structure, unspecified site: Secondary | ICD-10-CM | POA: Diagnosis present

## 2021-02-04 DIAGNOSIS — W010XXA Fall on same level from slipping, tripping and stumbling without subsequent striking against object, initial encounter: Secondary | ICD-10-CM | POA: Diagnosis present

## 2021-02-04 DIAGNOSIS — R739 Hyperglycemia, unspecified: Secondary | ICD-10-CM | POA: Diagnosis present

## 2021-02-04 DIAGNOSIS — S06335A Contusion and laceration of cerebrum, unspecified, with loss of consciousness greater than 24 hours with return to pre-existing conscious level, initial encounter: Secondary | ICD-10-CM | POA: Diagnosis not present

## 2021-02-04 DIAGNOSIS — S42291A Other displaced fracture of upper end of right humerus, initial encounter for closed fracture: Secondary | ICD-10-CM | POA: Diagnosis present

## 2021-02-04 DIAGNOSIS — F419 Anxiety disorder, unspecified: Secondary | ICD-10-CM | POA: Diagnosis present

## 2021-02-04 DIAGNOSIS — Z8673 Personal history of transient ischemic attack (TIA), and cerebral infarction without residual deficits: Secondary | ICD-10-CM | POA: Diagnosis not present

## 2021-02-04 DIAGNOSIS — Z515 Encounter for palliative care: Secondary | ICD-10-CM | POA: Diagnosis not present

## 2021-02-04 DIAGNOSIS — Z66 Do not resuscitate: Secondary | ICD-10-CM | POA: Diagnosis present

## 2021-02-04 DIAGNOSIS — S06340A Traumatic hemorrhage of right cerebrum without loss of consciousness, initial encounter: Secondary | ICD-10-CM | POA: Diagnosis present

## 2021-02-04 DIAGNOSIS — Z882 Allergy status to sulfonamides status: Secondary | ICD-10-CM | POA: Diagnosis not present

## 2021-02-04 LAB — BASIC METABOLIC PANEL
Anion gap: 9 (ref 5–15)
BUN: 21 mg/dL (ref 8–23)
CO2: 25 mmol/L (ref 22–32)
Calcium: 8.5 mg/dL — ABNORMAL LOW (ref 8.9–10.3)
Chloride: 106 mmol/L (ref 98–111)
Creatinine, Ser: 0.88 mg/dL (ref 0.44–1.00)
GFR, Estimated: >60 mL/min (ref >60)
Glucose, Bld: 159 mg/dL — ABNORMAL HIGH (ref 70–99)
Potassium: 3.9 mmol/L (ref 3.5–5.1)
Sodium: 140 mmol/L (ref 135–145)

## 2021-02-04 LAB — MRSA NEXT GEN BY PCR, NASAL: MRSA by PCR Next Gen: NOT DETECTED

## 2021-02-04 LAB — ABO/RH: ABO/RH(D): O NEG

## 2021-02-04 LAB — CBC
HCT: 36.7 % (ref 36.0–46.0)
HCT: 34.2 % — ABNORMAL LOW (ref 36.0–46.0)
Hemoglobin: 11.7 g/dL — ABNORMAL LOW (ref 12.0–15.0)
Hemoglobin: 11 g/dL — ABNORMAL LOW (ref 12.0–15.0)
MCH: 28.1 pg (ref 26.0–34.0)
MCH: 27.9 pg (ref 26.0–34.0)
MCHC: 31.9 g/dL (ref 30.0–36.0)
MCHC: 32.2 g/dL (ref 30.0–36.0)
MCV: 88 fL (ref 80.0–100.0)
MCV: 86.8 fL (ref 80.0–100.0)
Platelets: 209 10*3/uL (ref 150–400)
Platelets: 235 10*3/uL (ref 150–400)
RBC: 4.17 MIL/uL (ref 3.87–5.11)
RBC: 3.94 MIL/uL (ref 3.87–5.11)
RDW: 13.4 % (ref 11.5–15.5)
RDW: 13.4 % (ref 11.5–15.5)
WBC: 8.1 10*3/uL (ref 4.0–10.5)
WBC: 12 10*3/uL — ABNORMAL HIGH (ref 4.0–10.5)
nRBC: 0 % (ref 0.0–0.2)
nRBC: 0 % (ref 0.0–0.2)

## 2021-02-04 LAB — SODIUM
Sodium: 140 mmol/L (ref 135–145)
Sodium: 142 mmol/L (ref 135–145)
Sodium: 143 mmol/L (ref 135–145)

## 2021-02-04 LAB — TYPE AND SCREEN
ABO/RH(D): O NEG
Antibody Screen: NEGATIVE

## 2021-02-04 MED ORDER — SODIUM CHLORIDE 3 % IV SOLN
INTRAVENOUS | Status: DC
  Filled 2021-02-04 (×5): qty 500

## 2021-02-04 MED ORDER — ACETAMINOPHEN 325 MG PO TABS: 650.0000 mg | ORAL_TABLET | Freq: Four times a day (QID) | ORAL | Status: DC | PRN

## 2021-02-04 MED ORDER — ONDANSETRON HCL 4 MG/2ML IJ SOLN
4.0000 mg | Freq: Four times a day (QID) | INTRAMUSCULAR | Status: DC | PRN
  Administered 2021-02-04: 15:00:00 4 mg via INTRAVENOUS

## 2021-02-04 MED ORDER — SODIUM CHLORIDE 0.9% IV SOLUTION: Freq: Once | INTRAVENOUS | Status: AC

## 2021-02-04 MED ORDER — CHLORHEXIDINE GLUCONATE 0.12 % MT SOLN
15.0000 mL | Freq: Two times a day (BID) | OROMUCOSAL | Status: DC
  Administered 2021-02-04 – 2021-02-06 (×5): 15 mL via OROMUCOSAL
  Filled 2021-02-04 (×2): qty 15

## 2021-02-04 MED ORDER — SODIUM CHLORIDE 0.9% FLUSH
3.0000 mL | Freq: Two times a day (BID) | INTRAVENOUS | Status: DC
  Administered 2021-02-04 – 2021-02-06 (×5): 3 mL via INTRAVENOUS

## 2021-02-04 MED ORDER — SODIUM CHLORIDE 0.9 % IV SOLN: INTRAVENOUS | Status: DC

## 2021-02-04 MED ORDER — ACETAMINOPHEN 650 MG RE SUPP
650.0000 mg | Freq: Four times a day (QID) | RECTAL | Status: DC | PRN
  Administered 2021-02-06: 18:00:00 650 mg via RECTAL
  Filled 2021-02-04: qty 1

## 2021-02-04 MED ORDER — ORAL CARE MOUTH RINSE
15.0000 mL | Freq: Two times a day (BID) | OROMUCOSAL | Status: DC
  Administered 2021-02-04 – 2021-02-09 (×9): 15 mL via OROMUCOSAL

## 2021-02-04 MED ORDER — CHLORHEXIDINE GLUCONATE CLOTH 2 % EX PADS
6.0000 | MEDICATED_PAD | Freq: Every day | CUTANEOUS | Status: DC
  Administered 2021-02-04 – 2021-02-06 (×3): 6 via TOPICAL

## 2021-02-04 MED ORDER — LEVETIRACETAM IN NACL 500 MG/100ML IV SOLN
500.0000 mg | Freq: Two times a day (BID) | INTRAVENOUS | Status: DC
  Administered 2021-02-04 – 2021-02-07 (×9): 500 mg via INTRAVENOUS
  Filled 2021-02-04 (×9): qty 100

## 2021-02-04 NOTE — Progress Notes (Signed)
Patient ID: Carmen [name], female   DOB: 02/10/1940, 80 y.o.   MRN: [id number]   Patient with limited ROM to RUE. Guarding and grimacing with movement. Ecchymosis and edema noted when BP cuff removed for assessment. 1 view right shoulder radiograph was ordered.  Radiographic imaging revealed a comminuted fracture of the right humeral head. Ortho consult requested. Ortho PA stated he will see the patient.    Marvis Moeller, DNP, NP-C 02/04/2021 1:17 PM

## 2021-02-04 NOTE — Progress Notes (Signed)
Subjective: Patient is resting comfortably in bed with family at bedside. No acute events overnight.   Objective: Vital signs in last 24 hours: Temp:  [97.7 F (36.5 C)-99.3 F (37.4 C)] 99.3 F (37.4 C) (12/28 0600) Pulse Rate:  [70-90] 88 (12/28 0700) Resp:  [10-21] 17 (12/28 0700) BP: (110-139)/(66-87) 128/84 (12/28 0700) SpO2:  [92 %-96 %] 96 % (12/28 0700) Weight:  [59.1 kg] 59.1 kg (12/27 2033)  Intake/Output from previous day: 12/27 0701 - 12/28 0700 In: 100 [IV Piggyback:100] Out: -  Intake/Output this shift: No intake/output data recorded.  Physical Exam: Patient is somnolent. She arouses to repeated verbal and light noxious stimuli. She attempts to open her eyes and follow commands with stimulus. Mostly nonverbal but is attempting to speak more this morning as compared to last night. O x self only. Per son, disoriented to time at baseline. She is in NAD and VSS. MAE spontaneously against gravity. PERLA, EOMI. CNs grossly intact.     Lab Results: Recent Labs    02/01/2021 2118 02/04/21 0443  WBC 11.7* 8.1  HGB 12.2 11.7*  HCT 38.0 36.7  PLT 201 209   BMET Recent Labs    01/29/2021 2118 02/04/21 0443  NA 142 140  K 3.7 3.9  CL 106 106  CO2 26 25  GLUCOSE 159* 159*  BUN 25* 21  CREATININE 0.86 0.88  CALCIUM 8.8* 8.5*    Studies/Results: CT HEAD WO CONTRAST  Result Date: 02/04/2021 CLINICAL DATA:  Follow-up subarachnoid hemorrhage EXAM: CT HEAD WITHOUT CONTRAST TECHNIQUE: Contiguous axial images were obtained from the base of the skull through the vertex without intravenous contrast. COMPARISON:  Head CT from yesterday FINDINGS: Brain: Large right frontal parenchymal hemorrhage with rim of edema, hematoma measuring up to 7.2 x 4.4 cm, mildly increased from before. Associated vasogenic edema. Local mass effect with midline shift measuring 6 mm at the anterior interhemispheric fissure, progressed. There is now blood clot layering in the occipital horns of the  lateral ventricles which could be intraventricular extension or subarachnoid redistribution. Small volume subarachnoid hemorrhage at the high left cerebral convexity is unchanged. Inter hemispheric lipoma, incidental. Confluent chronic small vessel ischemia. Vascular: No hyperdense vessel or unexpected calcification. Skull: Negative Sinuses/Orbits: Left sphenoid sinus opacification. IMPRESSION: 1. Large right frontal parenchymal hematoma with mildly increased dimensions and increased adjacent swelling. Anterior inter hemispheric shift measures 6 mm. 2. New intraventricular blood clot without hydrocephalus. Unchanged small volume subarachnoid hemorrhage at the vertex. 3. Advanced chronic small vessel disease. Electronically Signed   By: Jorje Guild M.D.   On: 02/04/2021 05:49   CT Head Wo Contrast  Result Date: 01/24/2021 CLINICAL DATA:  Trauma. EXAM: CT HEAD WITHOUT CONTRAST TECHNIQUE: Contiguous axial images were obtained from the base of the skull through the vertex without intravenous contrast. COMPARISON:  Head CT dated 02/17/2020. FINDINGS: Brain: There is a large area of intraparenchymal hemorrhage in the right frontal lobe measuring 6.5 x 4.7 cm in greatest axial dimension approximately 4 cm in craniocaudal length. There is associated mild surrounding edema. Small subarachnoid hemorrhage as well as areas of cortical contusion noted in the right frontal lobe. There is mass effect and compression of the anterior horn of the right lateral ventricle. There is approximately 7 mm right to left midline shift of the septum pellucidum. There is moderate age-related atrophy and chronic microvascular ischemic changes. Additional areas of extra-axial hemorrhage over the left parietal convexity, likely subarachnoid hemorrhage. Vascular: No hyperdense vessel or unexpected calcification. Skull: Normal. Negative  for fracture or focal lesion. Sinuses/Orbits: There is opacification of the majority of the left sphenoid  sinus. The remainder of the visualized paranasal sinuses and mastoid air cells are clear. Other: Right forehead and periorbital hematoma. IMPRESSION: 1. Large right frontal hemorrhage with associated mass effect and approximately 7 mm right to left midline shift. 2. Smaller left parietal subarachnoid hemorrhage. 3. Moderate age-related atrophy and chronic microvascular ischemic changes. These results were called by telephone at the time of interpretation on 01/08/2021 at 9:06 pm to provider [facility] , who verbally acknowledged these results. Electronically Signed   By: Anner Crete M.D.   On: 02/02/2021 21:10   CT Cervical Spine Wo Contrast  Result Date: 01/30/2021 CLINICAL DATA:  Status post fall. EXAM: CT CERVICAL SPINE WITHOUT CONTRAST TECHNIQUE: Multidetector CT imaging of the cervical spine was performed without intravenous contrast. Multiplanar CT image reconstructions were also generated. COMPARISON:  None. FINDINGS: Alignment: Approximately 2 mm anterolisthesis of the C4 vertebral body is noted on C5. Skull base and vertebrae: No acute fracture. Chronic and degenerative changes are seen involving the body and tip of the dens as well as the adjacent portion of the anterior arch of C1. Soft tissues and spinal canal: No prevertebral fluid or swelling. No visible canal hematoma. Disc levels: Moderate severity endplate sclerosis and mild anterior osteophyte formation are seen at the level of C6-C7. Mild anterior osteophyte formation is also seen at the level of C4-C5. There is marked severity narrowing of the anterior atlantoaxial articulation. Moderate to marked severity intervertebral disc space narrowing is seen at C6-C7. Bilateral multilevel marked severity facet joint hypertrophy is noted. Upper chest: Negative. Other: None. IMPRESSION: 1. No acute fracture within the cervical spine. 2. Approximately 2 mm anterolisthesis of the C4 vertebral body on C5. 3. Moderate to marked severity  multilevel degenerative changes, most prominent at the level of C6-C7. Electronically Signed   By: Virgina Norfolk M.D.   On: 01/20/2021 21:26   CT Maxillofacial Wo Contrast  Result Date: 02/06/2021 CLINICAL DATA:  Trauma. EXAM: CT MAXILLOFACIAL WITHOUT CONTRAST TECHNIQUE: Multidetector CT imaging of the maxillofacial structures was performed. Multiplanar CT image reconstructions were also generated. COMPARISON:  Head CT dated 01/15/2021. FINDINGS: Osseous: No acute fracture.  No mandibular subluxation. Orbits: The globes and retro-orbital fat are preserved. Sinuses: Opacification of the left sphenoid sinus. The remainder of the visualized paranasal sinuses and mastoid air cells are clear. Soft tissues: Right periorbital hematoma. Limited intracranial: See head CT report. IMPRESSION: 1. No acute facial bone fractures. 2. Right periorbital hematoma. Electronically Signed   By: Anner Crete M.D.   On: 01/17/2021 21:18    Assessment/Plan: 80 year old female with mechanical fall and head trauma. Initial CT head revealed a large right frontal IPH with mass effect and approximately 7 mm right to left midline shift, as well as a small left parietal SAH. She was admitted to the Neuro ICU for close neurological monitoring. Her neurological exam remained stable to slightly improved overnight. She is protecting her airway. Repeat CT head this morning revealed slight increase in the right frontal IPH with mildly increased size and associated edema, MLS measuring 44mm right to left. Small SAH unchanged. 1 unit of platelets was ordered in the ED, unfortunately approximately half of the platelets were not transfused and had to be wasted due to time exceeding the expatriation date. Will transfuse 1 unit of platelets this morning. Currently she is not a surgical candidate due to size of bleed and neuro status being  stable. If her neurological status declines, she might need surgical intervention. Updated the patient's  son, daughter, and son-in-law who were at bedside.   -Neuro Icu -Frequent neurochecks -Repeat CT head in the morning -Transfuse platelets -Keppra  -DNR    LOS: 0 days     Marvis Moeller, DNP, NP-C 02/04/2021, 7:16 AM

## 2021-02-04 NOTE — Progress Notes (Addendum)
Patient with limited ROM to RUE. Guarding and grimacing with movement. Ecchymosis and edema noted when BP cuff removed for assessment. NP Josh aware, xray ordered.

## 2021-02-04 NOTE — Progress Notes (Addendum)
°  Transition of Care [facility]) Screening Note   Patient Details  Name: SHEVONNE WOLF Date of Birth: 08-Jun-1940   [facility]) CM/SW Contact:    Benard Halsted, LCSW Phone Number: 02/04/2021, 9:48 AM    Transition of Care Department [facility]) has reviewed patient. Patient resides at [facility]. We will continue to monitor patient advancement through interdisciplinary progression rounds. If new patient transition needs arise, please place a TOC consult.

## 2021-02-04 NOTE — Plan of Care (Signed)

## 2021-02-04 NOTE — Progress Notes (Addendum)
Per night shift report, Platelets started by ED RN at approximately 0240, however, saline was infusing upon arrival to unit instead of platelets. Night RN began infusing platelets, but blood now expired at time of this RN's shift. Blood stopped and discarded (about 100cc left in bag), vitals stable, blood bank and Josh NP made aware. Another unit of platelets to be ordered.

## 2021-02-04 NOTE — TOC CAGE-AID Note (Signed)
Transition of Care [facility]) - CAGE-AID Screening   Patient Details  Name: Carmen [name] MRN: [id number] Date of Birth: 08/10/1940  Clinical Narrative:  Patient disoriented x4, unable to participate in screening at this time.   CAGE-AID Screening: Substance Abuse Screening unable to be completed due to: : Patient unable to participate

## 2021-02-04 NOTE — Progress Notes (Signed)
Orthopedic Tech Progress Note Patient Details:  Carmen [name] [birth year] [id number]  Ortho Devices Type of Ortho Device: Sling immobilizer Ortho Device/Splint Location: RUE Ortho Device/Splint Interventions: Ordered, Application   Post Interventions Patient Tolerated: Well Instructions Provided: Care of [place] 02/04/2021, 3:43 PM

## 2021-02-04 NOTE — Consult Note (Signed)
Reason for Consult:Right shoulder fx Referring Physician: Pieter Partridge Dawley Time called: 1318 Time at bedside: 1339   Carmen [name] is an 80 y.o. female.  HPI: Becky fell yesterday at [facility] where she is in the memory care unit. The circumstances of the fall are unknown. She was brought to the ED and was admitted with TBI. Today she was noted to grimace with right shoulder movement and x-rays showed a humeral head fx and orthopedic surgery was consulted. She is RHD but still obtunded and unable to contribute to history or exam.  Past Medical History:  Diagnosis Date   Anxiety    Per [facility] New Patient Packet    Bleb x 4    Cerebral amyloid angiopathy (CODE) 12/09/2018   Depression    Per [facility] New Patient Packet    Dermatitis due to solar radiation    Dyshidrosis 05/03/2007   Fuchs' corneal dystrophy    Per [facility] New Patient Packet    Glaucoma    Heart murmur    Hyperlipidemia    Per [facility] New Patient Packet    Malignant melanoma ([place])    Per [facility] New Patient Packet    Osteopenia    Pernicious anemia    Per [facility] New Patient Packet    Tinnitus     Past Surgical History:  Procedure Laterality Date   CORNEAL TRANSPLANT     Dr.Carlson-[facility], Per [facility] New Patient Packet    MASTECTOMY     PLACEMENT OF BREAST IMPLANTS      Family History  Problem Relation Age of Onset   Breast cancer Mother    Stroke Other    Heart disease Other    Breast cancer Other    Dysphagia Neg Hx     Social History:  reports that she has never smoked. She has never used smokeless tobacco. She reports current alcohol use. She reports that she does not use drugs.  Allergies:  Allergies  Allergen Reactions   Sulfonamide Derivatives Other (See Comments)    Doesn't remember    Medications: I have reviewed the patient's current medications.  Results for orders placed or performed during the hospital encounter of 01/24/2021 (from the past 48 hour(s))  CBC with Differential     Status: Abnormal   Collection  Time: 02/04/2021  9:18 PM  Result Value Ref Range   WBC 11.7 (H) 4.0 - 10.5 K/uL   RBC 4.40 3.87 - 5.11 MIL/uL   Hemoglobin 12.2 12.0 - 15.0 g/dL   HCT 38.0 36.0 - 46.0 %   MCV 86.4 80.0 - 100.0 fL   MCH 27.7 26.0 - 34.0 pg   MCHC 32.1 30.0 - 36.0 g/dL   RDW 13.6 11.5 - 15.5 %   Platelets 201 150 - 400 K/uL   nRBC 0.0 0.0 - 0.2 %   Neutrophils Relative % 87 %   Neutro Abs 10.2 (H) 1.7 - 7.7 K/uL   Lymphocytes Relative 7 %   Lymphs Abs 0.8 0.7 - 4.0 K/uL   Monocytes Relative 5 %   Monocytes Absolute 0.6 0.1 - 1.0 K/uL   Eosinophils Relative 1 %   Eosinophils Absolute 0.1 0.0 - 0.5 K/uL   Basophils Relative 0 %   Basophils Absolute 0.0 0.0 - 0.1 K/uL   Immature Granulocytes 0 %   Abs Immature Granulocytes 0.05 0.00 - 0.07 K/uL    Comment: Performed at [facility], [address] [place], [place] [address]  Basic metabolic panel  Status: Abnormal   Collection Time: 01/31/2021  9:18 PM  Result Value Ref Range   Sodium 142 135 - 145 mmol/L   Potassium 3.7 3.5 - 5.1 mmol/L   Chloride 106 98 - 111 mmol/L   CO2 26 22 - 32 mmol/L   Glucose, Bld 159 (H) 70 - 99 mg/dL    Comment: Glucose reference range applies only to samples taken after fasting for at least 8 hours.   BUN 25 (H) 8 - 23 mg/dL   Creatinine, Ser 0.86 0.44 - 1.00 mg/dL   Calcium 8.8 (L) 8.9 - 10.3 mg/dL   GFR, Estimated >60 >60 mL/min    Comment: (NOTE) Calculated using the CKD-EPI Creatinine Equation (2021)    Anion gap 10 5 - 15    Comment: Performed at [facility], [address], [place], [place] [address]  Protime-INR     Status: None   Collection Time: 01/26/2021  9:18 PM  Result Value Ref Range   Prothrombin Time 13.2 11.4 - 15.2 seconds   INR 1.0 0.8 - 1.2    Comment: (NOTE) INR goal varies based on device and disease states. Performed at [facility], [address] [place], [place] [address]   Resp Panel by RT-PCR (Flu A&B, Covid)  Nasopharyngeal Swab     Status: None   Collection Time: 01/29/2021  9:18 PM   Specimen: Nasopharyngeal Swab; Nasopharyngeal(NP) swabs in vial transport medium  Result Value Ref Range   SARS Coronavirus 2 by RT PCR NEGATIVE NEGATIVE    Comment: (NOTE) SARS-CoV-2 target nucleic acids are NOT DETECTED.  The SARS-CoV-2 RNA is generally detectable in upper respiratory specimens during the acute phase of infection. The lowest concentration of SARS-CoV-2 viral copies this assay can detect is 138 copies/mL. A negative result does not preclude SARS-Cov-2 infection and should not be used as the sole basis for treatment or other patient management decisions. A negative result may occur with  improper specimen collection/handling, submission of specimen other than nasopharyngeal swab, presence of viral mutation(s) within the areas targeted by this assay, and inadequate number of viral copies(<138 copies/mL). A negative result must be combined with clinical observations, patient history, and epidemiological information. The expected result is Negative.  Fact Sheet for Patients:  EntrepreneurPulse.com.au  Fact Sheet for Healthcare Providers:  IncredibleEmployment.be  This test is no t yet approved or cleared by the [place] FDA and  has been authorized for detection and/or diagnosis of SARS-CoV-2 by FDA under an Emergency Use Authorization (EUA). This EUA will remain  in effect (meaning this test can be used) for the duration of the COVID-19 declaration under Section 564(b)(1) of the Act, 21 U.S.C.section 360bbb-3(b)(1), unless the authorization is terminated  or revoked sooner.       Influenza A by PCR NEGATIVE NEGATIVE   Influenza B by PCR NEGATIVE NEGATIVE    Comment: (NOTE) The Xpert Xpress SARS-CoV-2/FLU/RSV plus assay is intended as an aid in the diagnosis of influenza from Nasopharyngeal swab specimens and should not be used as a sole basis for  treatment. Nasal washings and aspirates are unacceptable for Xpert Xpress SARS-CoV-2/FLU/RSV testing.  Fact Sheet for Patients: EntrepreneurPulse.com.au  Fact Sheet for Healthcare Providers: IncredibleEmployment.be  This test is not yet approved or cleared by the [place] FDA and has been authorized for detection and/or diagnosis of SARS-CoV-2 by FDA under an Emergency Use Authorization (EUA). This EUA will remain in effect (meaning this test can be used) for the duration of the  COVID-19 declaration under Section 564(b)(1) of the Act, 21 U.S.C. section 360bbb-3(b)(1), unless the authorization is terminated or revoked.  Performed at [facility], [address] [place], [place] [address]   Prepare platelet pheresis     Status: None (Preliminary result)   Collection Time: 01/22/2021 10:57 PM  Result Value Ref Range   Unit Number [id number]    Blood Component Type PLTP2 PSORALEN TREATED    Unit division 00    Status of Unit ISSUED    Transfusion Status      OK TO TRANSFUSE Performed at [facility] [address], [place], [place] [address]   Type and screen [place]     Status: None   Collection Time: 01/29/2021 11:23 PM  Result Value Ref Range   ABO/RH(D) O NEG    Antibody Screen NEG    Sample Expiration      02/06/2021,2359 Performed at [facility], [place] [address] [place], [place] [address]   ABO/Rh     Status: None   Collection Time: 02/04/21 12:51 AM  Result Value Ref Range   ABO/RH(D)      O NEG Performed at [facility] [address] [place], [place] [address]   Basic metabolic panel     Status: Abnormal   Collection Time: 02/04/21  4:43 AM  Result Value Ref Range   Sodium 140 135 - 145 mmol/L   Potassium 3.9 3.5 - 5.1 mmol/L   Chloride 106 98 - 111 mmol/L   CO2 25 22 - 32 mmol/L   Glucose, Bld 159 (H) 70 - 99 mg/dL    Comment: Glucose reference range  applies only to samples taken after fasting for at least 8 hours.   BUN 21 8 - 23 mg/dL   Creatinine, Ser 0.88 0.44 - 1.00 mg/dL   Calcium 8.5 (L) 8.9 - 10.3 mg/dL   GFR, Estimated >60 >60 mL/min    Comment: (NOTE) Calculated using the CKD-EPI Creatinine Equation (2021)    Anion gap 9 5 - 15    Comment: Performed at [facility] [address] [place], [place] [address]  CBC     Status: Abnormal   Collection Time: 02/04/21  4:43 AM  Result Value Ref Range   WBC 8.1 4.0 - 10.5 K/uL   RBC 4.17 3.87 - 5.11 MIL/uL   Hemoglobin 11.7 (L) 12.0 - 15.0 g/dL   HCT 36.7 36.0 - 46.0 %   MCV 88.0 80.0 - 100.0 fL   MCH 28.1 26.0 - 34.0 pg   MCHC 31.9 30.0 - 36.0 g/dL   RDW 13.4 11.5 - 15.5 %   Platelets 209 150 - 400 K/uL   nRBC 0.0 0.0 - 0.2 %    Comment: Performed at [facility], [place] [address], [place], [place] [address]  MRSA Next Gen by PCR, Nasal     Status: None   Collection Time: 02/04/21  5:45 AM   Specimen: Nasal Mucosa; Nasal Swab  Result Value Ref Range   MRSA by PCR Next Gen NOT DETECTED NOT DETECTED    Comment: (NOTE) The GeneXpert MRSA Assay (FDA approved for NASAL specimens only), is one component of a comprehensive MRSA colonization surveillance program. It is not intended to diagnose MRSA infection nor to guide or monitor treatment for MRSA infections. Test performance is not FDA approved in patients less than 42 years old. Performed at [facility], [place]  [address] [place], [place] [address]   Prepare platelet pheresis     Status: None (Preliminary result)   Collection Time: 02/04/21  8:30 AM  Result Value Ref Range   Unit Number [id number]    Blood Component Type PLTP2 PSORALEN TREATED    Unit division 00    Status of Unit ISSUED    Transfusion Status      OK TO TRANSFUSE Performed at [facility] [address], [place], [place] [address]   Sodium     Status: None   Collection Time: 02/04/21 10:11 AM  Result Value Ref Range    Sodium 140 135 - 145 mmol/L    Comment: Performed at [facility] [address] [place], [place] [address]    DG Shoulder 1V Right  Result Date: 02/04/2021 CLINICAL DATA:  Swelling, recent fall EXAM: RIGHT SHOULDER - 1 VIEW COMPARISON:  No prior imaging of the shoulder, correlation is made with 02/17/2020 chest radiograph FINDINGS: Comminuted fracture of the right humeral head, which is incompletely evaluated on this single view. The humeral shaft appears impacted into the humeral head fragments. Degenerative changes in the acromioclavicular joint, without other acute abnormality. No focal soft tissue abnormality. Imaged lung is clear. Scapula is unremarkable. Osteopenia. IMPRESSION: Comminuted fracture of the right humeral head. These results will be called to the ordering clinician or representative by the Radiologist Assistant, and communication documented in the PACS or [employer]. Electronically Signed   By: Merilyn Baba M.D.   On: 02/04/2021 11:41   CT HEAD WO CONTRAST  Result Date: 02/04/2021 CLINICAL DATA:  Follow-up subarachnoid hemorrhage EXAM: CT HEAD WITHOUT CONTRAST TECHNIQUE: Contiguous axial images were obtained from the base of the skull through the vertex without intravenous contrast. COMPARISON:  Head CT from yesterday FINDINGS: Brain: Large right frontal parenchymal hemorrhage with rim of edema, hematoma measuring up to 7.2 x 4.4 cm, mildly increased from before. Associated vasogenic edema. Local mass effect with midline shift measuring 6 mm at the anterior interhemispheric fissure, progressed. There is now blood clot layering in the occipital horns of the lateral ventricles which could be intraventricular extension or subarachnoid redistribution. Small volume subarachnoid hemorrhage at the high left cerebral convexity is unchanged. Inter hemispheric lipoma, incidental. Confluent chronic small vessel ischemia. Vascular: No hyperdense vessel or unexpected calcification.  Skull: Negative Sinuses/Orbits: Left sphenoid sinus opacification. IMPRESSION: 1. Large right frontal parenchymal hematoma with mildly increased dimensions and increased adjacent swelling. Anterior inter hemispheric shift measures 6 mm. 2. New intraventricular blood clot without hydrocephalus. Unchanged small volume subarachnoid hemorrhage at the vertex. 3. Advanced chronic small vessel disease. Electronically Signed   By: Jorje Guild M.D.   On: 02/04/2021 05:49   CT Head Wo Contrast  Result Date: 01/28/2021 CLINICAL DATA:  Trauma. EXAM: CT HEAD WITHOUT CONTRAST TECHNIQUE: Contiguous axial images were obtained from the base of the skull through the vertex without intravenous contrast. COMPARISON:  Head CT dated 02/17/2020. FINDINGS: Brain: There is a large area of intraparenchymal hemorrhage in the right frontal lobe measuring 6.5 x 4.7 cm in greatest axial dimension approximately 4 cm in craniocaudal length. There is associated mild surrounding edema. Small subarachnoid hemorrhage as well as areas of cortical contusion noted in the right frontal lobe. There is mass effect and compression of the anterior horn of the right lateral ventricle. There is approximately 7 mm right to left midline shift of the septum pellucidum. There is moderate age-related atrophy and chronic microvascular ischemic changes. Additional areas  of extra-axial hemorrhage over the left parietal convexity, likely subarachnoid hemorrhage. Vascular: No hyperdense vessel or unexpected calcification. Skull: Normal. Negative for fracture or focal lesion. Sinuses/Orbits: There is opacification of the majority of the left sphenoid sinus. The remainder of the visualized paranasal sinuses and mastoid air cells are clear. Other: Right forehead and periorbital hematoma. IMPRESSION: 1. Large right frontal hemorrhage with associated mass effect and approximately 7 mm right to left midline shift. 2. Smaller left parietal subarachnoid hemorrhage. 3.  Moderate age-related atrophy and chronic microvascular ischemic changes. These results were called by telephone at the time of interpretation on 01/16/2021 at 9:06 pm to provider [facility] , who verbally acknowledged these results. Electronically Signed   By: Anner Crete M.D.   On: 01/10/2021 21:10   CT Cervical Spine Wo Contrast  Result Date: 01/12/2021 CLINICAL DATA:  Status post fall. EXAM: CT CERVICAL SPINE WITHOUT CONTRAST TECHNIQUE: Multidetector CT imaging of the cervical spine was performed without intravenous contrast. Multiplanar CT image reconstructions were also generated. COMPARISON:  None. FINDINGS: Alignment: Approximately 2 mm anterolisthesis of the C4 vertebral body is noted on C5. Skull base and vertebrae: No acute fracture. Chronic and degenerative changes are seen involving the body and tip of the dens as well as the adjacent portion of the anterior arch of C1. Soft tissues and spinal canal: No prevertebral fluid or swelling. No visible canal hematoma. Disc levels: Moderate severity endplate sclerosis and mild anterior osteophyte formation are seen at the level of C6-C7. Mild anterior osteophyte formation is also seen at the level of C4-C5. There is marked severity narrowing of the anterior atlantoaxial articulation. Moderate to marked severity intervertebral disc space narrowing is seen at C6-C7. Bilateral multilevel marked severity facet joint hypertrophy is noted. Upper chest: Negative. Other: None. IMPRESSION: 1. No acute fracture within the cervical spine. 2. Approximately 2 mm anterolisthesis of the C4 vertebral body on C5. 3. Moderate to marked severity multilevel degenerative changes, most prominent at the level of C6-C7. Electronically Signed   By: Virgina Norfolk M.D.   On: 01/17/2021 21:26   CT Maxillofacial Wo Contrast  Result Date: 02/07/2021 CLINICAL DATA:  Trauma. EXAM: CT MAXILLOFACIAL WITHOUT CONTRAST TECHNIQUE: Multidetector CT imaging of the maxillofacial  structures was performed. Multiplanar CT image reconstructions were also generated. COMPARISON:  Head CT dated 02/02/2021. FINDINGS: Osseous: No acute fracture.  No mandibular subluxation. Orbits: The globes and retro-orbital fat are preserved. Sinuses: Opacification of the left sphenoid sinus. The remainder of the visualized paranasal sinuses and mastoid air cells are clear. Soft tissues: Right periorbital hematoma. Limited intracranial: See head CT report. IMPRESSION: 1. No acute facial bone fractures. 2. Right periorbital hematoma. Electronically Signed   By: Anner Crete M.D.   On: 01/12/2021 21:18    Review of Systems  Unable to perform ROS: Dementia  Blood pressure (!) 150/95, pulse 86, temperature 99.8 F (37.7 C), temperature source Axillary, resp. rate 15, height 5\' 1"  (1.549 m), weight 59.1 kg, SpO2 95 %. Physical Exam Constitutional:      General: She is not in acute distress.    Appearance: She is well-developed. She is not diaphoretic.     Comments: Obtunded  HENT:     Head: Normocephalic.  Eyes:     General: No scleral icterus.       Right eye: No discharge.        Left eye: No discharge.     Conjunctiva/sclera: Conjunctivae normal.  Cardiovascular:     Rate and Rhythm:  Normal rate and regular rhythm.  Pulmonary:     Effort: Pulmonary effort is normal. No respiratory distress.  Musculoskeletal:     Cervical back: Normal range of motion.     Comments: Right shoulder, elbow, wrist, digits- no skin wounds, grimace with shoulder palpation, no instability, no blocks to motion  Sens  Ax/R/M/U could not assess  Mot   Ax/ R/ PIN/ M/ AIN/ U could not assess  Rad 2+  Skin:    General: Skin is warm and dry.  Psychiatric:     Comments: Obtunded    Assessment/Plan: Right humeral head fx -- Plan non-operative treatment with sling, NWB. F/u with Dr. Mable Fill in about 2 weeks. Sling may come off for dressing/bathing/etc.    Lisette Abu, PA-C Orthopedic  Surgery [phone] 02/04/2021, 1:53 PM

## 2021-02-05 LAB — SODIUM
Sodium: 146 mmol/L — ABNORMAL HIGH (ref 135–145)
Sodium: 147 mmol/L — ABNORMAL HIGH (ref 135–145)
Sodium: 148 mmol/L — ABNORMAL HIGH (ref 135–145)
Sodium: 150 mmol/L — ABNORMAL HIGH (ref 135–145)

## 2021-02-05 LAB — PREPARE PLATELET PHERESIS
Unit division: 0
Unit division: 0

## 2021-02-05 LAB — BPAM PLATELET PHERESIS
Blood Product Expiration Date: 202212292359
Blood Product Expiration Date: 202212292359
ISSUE DATE / TIME: 202212280154
ISSUE DATE / TIME: 202212280901
Unit Type and Rh: 7300
Unit Type and Rh: 6200

## 2021-02-05 MED ORDER — LABETALOL HCL 5 MG/ML IV SOLN
10.0000 mg | INTRAVENOUS | Status: DC | PRN
Start: 2021-02-05 — End: 2021-02-06
  Administered 2021-02-05 – 2021-02-06 (×3): 20 mg via INTRAVENOUS
  Filled 2021-02-05 (×3): qty 4

## 2021-02-05 NOTE — Hospital Course (Signed)
02/04/2021 - 3% started, ortho consult for right humeral head fracture

## 2021-02-05 NOTE — Progress Notes (Addendum)
Subjective: Patient is lying comfortably in bed in NAD with daughter at bedside. No acute events overnight.   Objective: Vital signs in last 24 hours: Temp:  [97.6 F (36.4 C)-99.8 F (37.7 C)] 98.8 F (37.1 C) (12/29 0409) Pulse Rate:  [73-101] 95 (12/29 0700) Resp:  [15-21] 19 (12/29 0700) BP: (125-159)/(67-98) 144/86 (12/29 0700) SpO2:  [87 %-96 %] 88 % (12/29 0700)  Intake/Output from previous day: 12/28 0701 - 12/29 0700 In: 1810.7 [I.V.:1035.7; Blood:575; IV Piggyback:200] Out: 1250 [DVVOH:6073] Intake/Output this shift: No intake/output data recorded.  Physical Exam: Patient is somnolent. She is less interactive today. No eye opening. Nonverbal. Per son, disoriented to time at baseline. She is in NAD and VSS. MAE spontaneously against gravity. Withdrawals from noxious stimuli in her BUE and BLE. PERLA, EOMI. CNs grossly intact. RUE sling in place.   Lab Results: Recent Labs    02/04/21 0443 02/04/21 1506  WBC 8.1 12.0*  HGB 11.7* 11.0*  HCT 36.7 34.2*  PLT 209 235   BMET Recent Labs    01/13/2021 2118 02/04/21 0443 02/04/21 1011 02/04/21 2016 02/05/21 0420  NA 142 140   < > 143 146*  K 3.7 3.9  --   --   --   CL 106 106  --   --   --   CO2 26 25  --   --   --   GLUCOSE 159* 159*  --   --   --   BUN 25* 21  --   --   --   CREATININE 0.86 0.88  --   --   --   CALCIUM 8.8* 8.5*  --   --   --    < > = values in this interval not displayed.    Studies/Results: DG Shoulder 1V Right  Result Date: 02/04/2021 CLINICAL DATA:  Swelling, recent fall EXAM: RIGHT SHOULDER - 1 VIEW COMPARISON:  No prior imaging of the shoulder, correlation is made with 02/17/2020 chest radiograph FINDINGS: Comminuted fracture of the right humeral head, which is incompletely evaluated on this single view. The humeral shaft appears impacted into the humeral head fragments. Degenerative changes in the acromioclavicular joint, without other acute abnormality. No focal soft tissue  abnormality. Imaged lung is clear. Scapula is unremarkable. Osteopenia. IMPRESSION: Comminuted fracture of the right humeral head. These results will be called to the ordering clinician or representative by the Radiologist Assistant, and communication documented in the PACS or [employer]. Electronically Signed   By: Merilyn Baba M.D.   On: 02/04/2021 11:41   CT HEAD WO CONTRAST  Result Date: 02/04/2021 CLINICAL DATA:  Follow-up subarachnoid hemorrhage EXAM: CT HEAD WITHOUT CONTRAST TECHNIQUE: Contiguous axial images were obtained from the base of the skull through the vertex without intravenous contrast. COMPARISON:  Head CT from yesterday FINDINGS: Brain: Large right frontal parenchymal hemorrhage with rim of edema, hematoma measuring up to 7.2 x 4.4 cm, mildly increased from before. Associated vasogenic edema. Local mass effect with midline shift measuring 6 mm at the anterior interhemispheric fissure, progressed. There is now blood clot layering in the occipital horns of the lateral ventricles which could be intraventricular extension or subarachnoid redistribution. Small volume subarachnoid hemorrhage at the high left cerebral convexity is unchanged. Inter hemispheric lipoma, incidental. Confluent chronic small vessel ischemia. Vascular: No hyperdense vessel or unexpected calcification. Skull: Negative Sinuses/Orbits: Left sphenoid sinus opacification. IMPRESSION: 1. Large right frontal parenchymal hematoma with mildly increased dimensions and increased adjacent swelling. Anterior inter hemispheric shift  measures 6 mm. 2. New intraventricular blood clot without hydrocephalus. Unchanged small volume subarachnoid hemorrhage at the vertex. 3. Advanced chronic small vessel disease. Electronically Signed   By: Jorje Guild M.D.   On: 02/04/2021 05:49   CT Head Wo Contrast  Result Date: 01/16/2021 CLINICAL DATA:  Trauma. EXAM: CT HEAD WITHOUT CONTRAST TECHNIQUE: Contiguous axial images were  obtained from the base of the skull through the vertex without intravenous contrast. COMPARISON:  Head CT dated 02/17/2020. FINDINGS: Brain: There is a large area of intraparenchymal hemorrhage in the right frontal lobe measuring 6.5 x 4.7 cm in greatest axial dimension approximately 4 cm in craniocaudal length. There is associated mild surrounding edema. Small subarachnoid hemorrhage as well as areas of cortical contusion noted in the right frontal lobe. There is mass effect and compression of the anterior horn of the right lateral ventricle. There is approximately 7 mm right to left midline shift of the septum pellucidum. There is moderate age-related atrophy and chronic microvascular ischemic changes. Additional areas of extra-axial hemorrhage over the left parietal convexity, likely subarachnoid hemorrhage. Vascular: No hyperdense vessel or unexpected calcification. Skull: Normal. Negative for fracture or focal lesion. Sinuses/Orbits: There is opacification of the majority of the left sphenoid sinus. The remainder of the visualized paranasal sinuses and mastoid air cells are clear. Other: Right forehead and periorbital hematoma. IMPRESSION: 1. Large right frontal hemorrhage with associated mass effect and approximately 7 mm right to left midline shift. 2. Smaller left parietal subarachnoid hemorrhage. 3. Moderate age-related atrophy and chronic microvascular ischemic changes. These results were called by telephone at the time of interpretation on 01/31/2021 at 9:06 pm to provider [facility] , who verbally acknowledged these results. Electronically Signed   By: Anner Crete M.D.   On: 01/15/2021 21:10   CT Cervical Spine Wo Contrast  Result Date: 02/05/2021 CLINICAL DATA:  Status post fall. EXAM: CT CERVICAL SPINE WITHOUT CONTRAST TECHNIQUE: Multidetector CT imaging of the cervical spine was performed without intravenous contrast. Multiplanar CT image reconstructions were also generated. COMPARISON:   None. FINDINGS: Alignment: Approximately 2 mm anterolisthesis of the C4 vertebral body is noted on C5. Skull base and vertebrae: No acute fracture. Chronic and degenerative changes are seen involving the body and tip of the dens as well as the adjacent portion of the anterior arch of C1. Soft tissues and spinal canal: No prevertebral fluid or swelling. No visible canal hematoma. Disc levels: Moderate severity endplate sclerosis and mild anterior osteophyte formation are seen at the level of C6-C7. Mild anterior osteophyte formation is also seen at the level of C4-C5. There is marked severity narrowing of the anterior atlantoaxial articulation. Moderate to marked severity intervertebral disc space narrowing is seen at C6-C7. Bilateral multilevel marked severity facet joint hypertrophy is noted. Upper chest: Negative. Other: None. IMPRESSION: 1. No acute fracture within the cervical spine. 2. Approximately 2 mm anterolisthesis of the C4 vertebral body on C5. 3. Moderate to marked severity multilevel degenerative changes, most prominent at the level of C6-C7. Electronically Signed   By: Virgina Norfolk M.D.   On: 01/13/2021 21:26   CT Maxillofacial Wo Contrast  Result Date: 01/31/2021 CLINICAL DATA:  Trauma. EXAM: CT MAXILLOFACIAL WITHOUT CONTRAST TECHNIQUE: Multidetector CT imaging of the maxillofacial structures was performed. Multiplanar CT image reconstructions were also generated. COMPARISON:  Head CT dated 02/05/2021. FINDINGS: Osseous: No acute fracture.  No mandibular subluxation. Orbits: The globes and retro-orbital fat are preserved. Sinuses: Opacification of the left sphenoid sinus. The remainder of  the visualized paranasal sinuses and mastoid air cells are clear. Soft tissues: Right periorbital hematoma. Limited intracranial: See head CT report. IMPRESSION: 1. No acute facial bone fractures. 2. Right periorbital hematoma. Electronically Signed   By: Anner Crete M.D.   On: 02/04/2021 21:18     Assessment/Plan: 80 year old female with mechanical fall and head trauma with subsequent large right frontal IPH with mass effect and midline shift, and a small left parietal SAH. Neuro exam is stable and she is waxing and waning. Hypertonic saline stated yesterday. Na 146 this morning. She is continuing to protect her airway well. She was noted to have decreased ROM in her RUE with guarding and grimacing with palpation of the right shoulder. Shoulder radiograph was obtained and was remarkable for a right humeral head fracture. Ortho was consulted and placed her in a RUE sling with orders for NWB. Plan for her to follow up with ortho in about 2 weeks. I appreciate the input from the ortho team. Updated the patient's daughter who was at bedside.     -Neuro Icu -Frequent neurochecks -Hypertonic saline -Keppra  -DNR -Maintain RUE sling   LOS: 1 day     Marvis Moeller, DNP, NP-C 02/05/2021, 8:10 AM  Addendum:  Pt s/e, eyes open to pain, BUE localizes, BLE move purposefully   Cont medical management, HTS, neuro checks Coretrak tomorrow   Thank you for allowing me to participate in this patient's care.  Please do not hesitate to call with questions or concerns.   Elwin Sleight, [facility] Cell: [phone]

## 2021-02-05 NOTE — Progress Notes (Signed)
Initial Nutrition Assessment  DOCUMENTATION CODES:   Not applicable  INTERVENTION:   If desired initiate tube feeding via Cortrak: Osmolite 1.2 at 20 ml/h and increase by 10 ml every 8 hours to goal rate of 50 ml/hr (1200 ml per day) Prosource TF 45 ml daily   Provides 1480 kcal, 77 gm protein, 973 ml free water daily   If able to advance diet recommend, encourage PO as able and supplement diet as appropriate.   NUTRITION DIAGNOSIS:   Inadequate oral intake related to inability to eat as evidenced by NPO status.  GOAL:   Patient will meet greater than or equal to 90% of their needs  MONITOR:   Diet advancement  REASON FOR ASSESSMENT:   Rounds    ASSESSMENT:   Pt with PMH of dementia with cerebral amyloid angiopathy, anxiety, depression, osteopenia, and HLD admitted from her demenia care unit after a fall with large R frontal IPH with 7 mm shift and small L parietal SAH.   Pt discussed during ICU rounds and with RN. Per notes pt is DNR by would consider short term nutrition and may get a cortrak tomorrow.   Per RN possible cortrak tomorrow.  Pt does not respond to questions or open eyes during exam.  2L O2 Crum  Noted exam of lower extremities have depletions, unable to determine at this time activity level.   Medications reviewed and include:  Hypertonic saline  Labs reviewed: Na 147  NFPE completed    Diet Order:   Diet Order             Diet NPO time specified  Diet effective now                   EDUCATION NEEDS:   No education needs have been identified at this time  Skin:  Skin Assessment: Reviewed RN Assessment  Last BM:  12/28 small  Height:   Ht Readings from Last 1 Encounters:  02/07/2021 5\' 1"  (1.549 m)    Weight:   Wt Readings from Last 1 Encounters:  02/04/2021 59.1 kg    BMI:  Body mass index is 24.62 kg/m.  Estimated Nutritional Needs:   Kcal:  1400-1600  Protein:  70-85 grams  Fluid:  >1.5 L/day  Lockie Pares., RD,  LDN, CNSC See AMiON for contact information

## 2021-02-06 DIAGNOSIS — Z515 Encounter for palliative care: Secondary | ICD-10-CM | POA: Diagnosis not present

## 2021-02-06 DIAGNOSIS — Z7189 Other specified counseling: Secondary | ICD-10-CM | POA: Diagnosis not present

## 2021-02-06 LAB — SODIUM
Sodium: 152 mmol/L — ABNORMAL HIGH (ref 135–145)
Sodium: 155 mmol/L — ABNORMAL HIGH (ref 135–145)
Sodium: 156 mmol/L — ABNORMAL HIGH (ref 135–145)
Sodium: 160 mmol/L — ABNORMAL HIGH (ref 135–145)

## 2021-02-06 MED ORDER — GLYCOPYRROLATE 1 MG PO TABS
1.0000 mg | ORAL_TABLET | ORAL | Status: DC | PRN
  Filled 2021-02-06: qty 1

## 2021-02-06 MED ORDER — GLYCOPYRROLATE 0.2 MG/ML IJ SOLN: 0.2000 mg | INTRAMUSCULAR | Status: DC | PRN

## 2021-02-06 MED ORDER — SODIUM CHLORIDE 3 % IV SOLN
INTRAVENOUS | Status: DC
  Filled 2021-02-06: qty 500

## 2021-02-06 MED ORDER — HALOPERIDOL 0.5 MG PO TABS
0.5000 mg | ORAL_TABLET | ORAL | Status: DC | PRN
  Filled 2021-02-06: qty 1

## 2021-02-06 MED ORDER — HALOPERIDOL LACTATE 2 MG/ML PO CONC
0.5000 mg | ORAL | Status: DC | PRN
  Filled 2021-02-06: qty 0.3

## 2021-02-06 MED ORDER — BIOTENE DRY MOUTH MT LIQD: 15.0000 mL | OROMUCOSAL | Status: DC | PRN

## 2021-02-06 MED ORDER — HALOPERIDOL LACTATE 5 MG/ML IJ SOLN: 0.5000 mg | INTRAMUSCULAR | Status: DC | PRN

## 2021-02-06 MED ORDER — GLYCOPYRROLATE 0.2 MG/ML IJ SOLN
0.2000 mg | INTRAMUSCULAR | Status: DC | PRN
  Administered 2021-02-08: 0.2 mg via INTRAVENOUS
  Filled 2021-02-06: qty 1

## 2021-02-06 MED ORDER — POLYVINYL ALCOHOL 1.4 % OP SOLN
1.0000 [drp] | Freq: Four times a day (QID) | OPHTHALMIC | Status: DC | PRN
  Filled 2021-02-06: qty 15

## 2021-02-06 MED ORDER — SODIUM CHLORIDE 3 % IV SOLN: INTRAVENOUS | Status: DC

## 2021-02-06 MED ORDER — CHLORHEXIDINE GLUCONATE CLOTH 2 % EX PADS
6.0000 | MEDICATED_PAD | Freq: Every day | CUTANEOUS | Status: DC
  Administered 2021-02-07: 6 via TOPICAL

## 2021-02-06 NOTE — Consult Note (Signed)
Palliative Medicine Inpatient Consult Note  Consulting Provider: Ellene Route, RN  Reason for consult:   [facility] Palliative Medicine Consult  Reason for Consult? Poor prognosis   HPI:  Per intake H&P --> Carmen [name] is a 80 y.o. female who is BIB EMS to [facility] after a witnessed mechanical fall. She has a PmHx significant for dementia with cerebral amyloid angiopathy, anxiety, depression, osteopenia, and HLD. Denies any anticoagulation use. Patient is a resident at [facility] facility. She was reported to have tripped and fell around 6:00 pm today. Patient became agitated, facility gave her Ativan. Unsure if administered prior to or after the fall. Facility reports patient at her baseline following fall. At baseline, she is conversational however turns to nonsensical speech. Per her son, the patient is typically lethargic in the evenings after receiving her evening meds. Her fall was was about 20 minutes after her med admin. Ambulatory at baseline. CTH while in the ED revealed a large right frontal IPH with mass effect and MLS, as well as a small left parietal SAH. Neurosurgery consult was requested. NSX requested patient to be transferred to [facility] for further neurological observation and potential intervention.   Palliative care was asked to get involved in the setting of a poor prognosis due to a large right frontal intraparenchymal hemorrhage with mass-effect.  Clinical Assessment/Goals of Care:  *Please note that this is a verbal dictation therefore any spelling or grammatical errors are due to the "[place]" system interpretation.  I have reviewed medical records including EPIC notes, labs and imaging, received report from bedside RN, assessed the patient who is lying in bed breathing deeply, appears to be sleeping in no acute distress.    Myself and patient's bedside RN Maudie Mercury met with patient's daughter, Carmen [name] called on  speaker phone or her siblings Carmen [name] and Carmen [name] to further discuss diagnosis prognosis, [place], EOL wishes, disposition and options.    A review of her back is history was had inclusive of her dementia which had worsened over the past 5 years since her husband passed away.  She had gone from living at home with a nurse to then requiring an increased level of care and going to [facility].  Shortly after transitioning there the [place] pandemic hit and she was "in place".  She had suffered a small stroke in the past leading to a more pronounced decline of her cognitive state.  She has required an increased level of care over the years at [facility].  Patient's family realize the significance of any type of brain injury or infarction.   I introduced Palliative Medicine as specialized medical care for people living with serious illness. It focuses on providing relief from the symptoms and stress of a serious illness. The goal is to improve quality of life for both the patient and the family.  Toneisha is from [facility] though she is lived in [place] most of her life.  She is a widow as her husband passed away 5 years ago.  She has 2 daughters and 1 son.  She has 11 grandchildren.  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  She loves traveling and being with family.  She also adored shopping.  She has a strong faith and practices within the first [facility].  Prior to admission she was living in the [facility] memory care unit.  She was able to mobilize.  She required help  with arranging meals that she had been able to feed herself.  She required assistance with bathing and tasks such as dressing.  Patient's 3 children are her decision makers.   Concepts specific to code status, artifical feeding and hydration, continued IV antibiotics and rehospitalization was had.  Patient is a DNAR/DNI CODE STATUS.  The difference between a aggressive medical intervention path   and a palliative comfort care path for this patient at this time was had.  We reviewed that we can continue present modalities of care though per patient's RN Maudie Mercury and myself there is much that goes into that inclusive of continued diagnostic test, continued laboratories sticks, continued need for additional supplemental nutrition which should be administered via nasogastric tube.  Reviewed that in the setting of Genna's already pronounced dementia she is likely to not be the same person after this event as she was before.  Discussed an alternative path to continue medical interventions is continuing with a comfort oriented approach.  This would minimize additional procedures/diagnostic testing and enable the medical team to provide comfort to Carmen [name] during her final journey on earth.  Patient's family was in flux regarding this decision as they had not spoken to the neurosurgery team therefore we shared the importance of them having a conversation prior to additional decisions being made.  Discussed the importance of continued conversation with family and their  medical providers regarding overall plan of care and treatment options, ensuring decisions are within the context of the patients values and GOCs.  _____________________________________________________ Addendum:  I met with patient's daughter Carmen [name] at bedside she shares that she is spoken to her family and that all determined a comfort oriented path would be the best choice for Carmen [name].  Carmen [name] is very tearful given the magnitude of this decision.  She shares that she still has not spoken to the neurosurgery team which is also an important piece for her brother to feel comfortable with these decisions.  I reviewed with Carmen [name] again what comfort oriented care would look like and where would take place.  We reviewed the differences between inpatient care and hospice care [facility].  Again conferred that additional information from neurosurgery was  needed. ________________________________________________________ Addendum to:  I received a call from Carmen [name] this afternoon she had spoken to Fenton Malling of the neurosurgery team.  Her brother and she had decided to continue with hypertonic saline for at least 2 more days.  If no improvements are seen by Sunday the transition to a full comfort emphasis of care will be pursued.  We reviewed the importance of all family members being comfortable with any decisions being made moving forward.  I was able to call nurse practitioner Fenton Malling myself to confirm the plan for a modified comfort care which would include hypertonic saline and Keppra intravenously.  Decision Maker: Patient has three children, Carmen [name], Jody, and Carmen [name] who will make decisions together  SUMMARY OF RECOMMENDATIONS   DNAR/DNI  Plan for modified comfort care - no additional diagnostic tests with the exception of sodium draws, no placement of a nasogastric tube  For now patient's family would like to continue with hypertonic saline and intravenous Keppra - Plan to reassess on Sunday if no improvements are seen we will proceed with full comfort oriented care  Ongoing palliative care support for patient and her family during hospitalization  Code Status/Advance Care Planning: DNAR/DNI    Palliative Prophylaxis:  Oral care, mobility  Additional Recommendations (Limitations, Scope, Preferences): Continue current scope of care  Psycho-social/Spiritual:  Desire for further Chaplaincy support: Declines Additional Recommendations: Education on significance of IPH in patients with dementia   Prognosis: Prognosis is quite poor overall given patient's already debilitated state  Discharge Planning: Discharge plan uncertain at this time  Vitals:   02/06/21 1400 02/06/21 1418  BP: 128/88   Pulse: 61   Resp: 18   Temp:    SpO2: 97% 96%    Intake/Output Summary (Last 24 hours) at 02/06/2021 1624 Last data filed at  02/06/2021 1400 Gross per 24 hour  Intake 1291.93 ml  Output 1525 ml  Net -233.07 ml   Last Weight  Most recent update: 02/06/2021  5:24 AM    Weight  56.3 kg (124 lb 1.9 oz)            Gen: Very frail elderly female in no acute distress HEENT: Dry mucous membranes CV: Regular rate and rhythm PULM: On 2 L nasal cannula ABD: soft/nontender EXT: No edema Neuro: Somnolent, unable to arouse  PPS: 10%   This conversation/these recommendations were discussed with patient primary care team, Dr. Reatha Armour  Time In: 115PM Time Out: 1500 Total Time: 105 Greater than 50%  of this time was spent counseling and coordinating care related to the above assessment and plan.  [facility] Team Team Cell Phone: [phone] Please utilize secure chat with additional questions, if there is no response within 30 minutes please call the above phone number  Palliative Medicine Team providers are available by phone from 7am to 7pm daily and can be reached through the team cell phone.  Should this patient require assistance outside of these hours, please call the patient's attending physician.

## 2021-02-06 NOTE — Progress Notes (Addendum)
Subjective: Patient with no acute events overnight. Patient's son is at bedside. She continues to be non-verbal and is unable to follow commands.  Objective: Vital signs in last 24 hours: Temp:  [97.6 F (36.4 C)-100.1 F (37.8 C)] 97.6 F (36.4 C) (12/30 0400) Pulse Rate:  [62-98] 62 (12/30 0700) Resp:  [13-25] 17 (12/30 0700) BP: (133-169)/(63-119) 152/74 (12/30 0700) SpO2:  [90 %-98 %] 98 % (12/30 0700) Weight:  [56.3 kg] 56.3 kg (12/30 0500)  Intake/Output from previous day: 12/29 0701 - 12/30 0700 In: 1254.7 [I.V.:1054.7; IV Piggyback:200] Out: 1875 [Urine:1875] Intake/Output this shift: No intake/output data recorded.  Physical Exam: Patient continues to be somnolent. She is unable to follow commands. No eye opening. Nonverbal. She is in NAD and VSS. MAE spontaneously against gravity. Withdrawals from noxious stimuli in her BUE and BLE. PERLA, EOMI. CNs grossly intact. RUE sling in place.      Lab Results: Recent Labs    02/04/21 0443 02/04/21 1506  WBC 8.1 12.0*  HGB 11.7* 11.0*  HCT 36.7 34.2*  PLT 209 235   BMET Recent Labs    01/25/2021 2118 02/04/21 0443 02/04/21 1011 02/05/21 2035 02/06/21 0330  NA 142 140   < > 150* 152*  K 3.7 3.9  --   --   --   CL 106 106  --   --   --   CO2 26 25  --   --   --   GLUCOSE 159* 159*  --   --   --   BUN 25* 21  --   --   --   CREATININE 0.86 0.88  --   --   --   CALCIUM 8.8* 8.5*  --   --   --    < > = values in this interval not displayed.    Studies/Results: DG Shoulder 1V Right  Result Date: 02/04/2021 CLINICAL DATA:  Swelling, recent fall EXAM: RIGHT SHOULDER - 1 VIEW COMPARISON:  No prior imaging of the shoulder, correlation is made with 02/17/2020 chest radiograph FINDINGS: Comminuted fracture of the right humeral head, which is incompletely evaluated on this single view. The humeral shaft appears impacted into the humeral head fragments. Degenerative changes in the acromioclavicular joint, without other  acute abnormality. No focal soft tissue abnormality. Imaged lung is clear. Scapula is unremarkable. Osteopenia. IMPRESSION: Comminuted fracture of the right humeral head. These results will be called to the ordering clinician or representative by the Radiologist Assistant, and communication documented in the PACS or [employer]. Electronically Signed   By: Merilyn Baba M.D.   On: 02/04/2021 11:41    Assessment/Plan: 80 year old female with mechanical fall was found to have a large right frontal IPH with mass effect and midline shift, and a small left parietal SAH. The patient's neurological exam continues to be waxing and waning. Hypertonic saline gtt continues. Na 152 this morning. She is continuing to protect her airway well. Slight drop in pulse ox yesterday led to supplemental oxygen through [place] with subsequent improvement in oxygenation. BP's slightly elevated above goal. Added PRN antihypertensive yesterday and patient's BPs have remained within goal. Updated the patient's son who was at bedside. Continue medical management.    -Plan for cortrak today -Dietitian consult for nutritional support -Neuro Icu -Frequent neurochecks -Hypertonic saline -Keppra  -DNR -Maintain RUE sling      LOS: 2 days     Marvis Moeller, DNP, NP-C 02/06/2021, 8:00 AM  Addendum:  Agree with above  Palliative consult placed, plan for cont treatment until Sunday and then possible transition to comfort care if no improvement   Thank you for allowing me to participate in this patient's care.  Please do not hesitate to call with questions or concerns.   Elwin Sleight, [facility] Cell: [phone]

## 2021-02-07 ENCOUNTER — Other Ambulatory Visit: Payer: Self-pay

## 2021-02-07 DIAGNOSIS — Z515 Encounter for palliative care: Secondary | ICD-10-CM | POA: Diagnosis not present

## 2021-02-07 DIAGNOSIS — Z7189 Other specified counseling: Secondary | ICD-10-CM | POA: Diagnosis not present

## 2021-02-07 LAB — SODIUM
Sodium: 164 mmol/L (ref 135–145)
Sodium: 162 mmol/L (ref 135–145)
Sodium: 164 mmol/L (ref 135–145)
Sodium: 163 mmol/L (ref 135–145)

## 2021-02-07 MED ORDER — LORAZEPAM 2 MG/ML IJ SOLN
0.5000 mg | INTRAMUSCULAR | Status: DC | PRN
  Administered 2021-02-07 – 2021-02-08 (×4): 1 mg via INTRAVENOUS
  Filled 2021-02-07 (×4): qty 1

## 2021-02-07 MED ORDER — SODIUM CHLORIDE 0.9 % IV SOLN: INTRAVENOUS | Status: DC

## 2021-02-07 MED ORDER — MORPHINE SULFATE (PF) 2 MG/ML IV SOLN
1.0000 mg | INTRAVENOUS | Status: DC | PRN
Start: 2021-02-07 — End: 2021-02-08
  Administered 2021-02-07 (×2): 2 mg via INTRAVENOUS
  Administered 2021-02-08 (×2): 1 mg via INTRAVENOUS
  Administered 2021-02-08: 2 mg via INTRAVENOUS
  Filled 2021-02-07 (×4): qty 1

## 2021-02-07 NOTE — Progress Notes (Signed)
Date and time results received: 02/07/21 0430   Test: sodium Critical Value: 164  Name of Provider Notified: Neurosurgery on call  Orders Received? Or Actions Taken?: 3% turned off per order for sodium >160

## 2021-02-07 NOTE — Progress Notes (Signed)
Patient remains unconscious.  Will moan and groan with stimulation.  Semipurposeful on her right side.  Weak flexion on her left.  Pupils equal bilaterally.  Patient with large volume right frontal hemorrhage secondary to trauma compounded by amyloid angiopathy and Plavix use.  No improvement with maximal medical efforts.  Patient's family moving toward full comfort care.  No indication for surgical intervention at present.

## 2021-02-07 NOTE — Progress Notes (Signed)
Palliative Medicine Inpatient Follow Up Note   Consulting Provider: Ellene Route, RN   Reason for consult:   Carmen [name] Palliative Medicine Consult  Reason for Consult? Poor prognosis    HPI:  Per intake H&P --> Carmen [name] is a 80 y.o. female who is BIB EMS to [facility] after a witnessed mechanical fall. She has a PmHx significant for dementia with cerebral amyloid angiopathy, anxiety, depression, osteopenia, and HLD. Denies any anticoagulation use. Patient is a resident at [facility] facility. She was reported to have tripped and fell around 6:00 pm today. Patient became agitated, facility gave her Ativan. Unsure if administered prior to or after the fall. Facility reports patient at her baseline following fall. At baseline, she is conversational however turns to nonsensical speech. Per her son, the patient is typically lethargic in the evenings after receiving her evening meds. Her fall was was about 20 minutes after her med admin. Ambulatory at baseline. CTH while in the ED revealed a large right frontal IPH with mass effect and MLS, as well as a small left parietal SAH. Neurosurgery consult was requested. NSX requested patient to be transferred to [facility] for further neurological observation and potential intervention.    Palliative care was asked to get involved in the setting of a poor prognosis due to a large right frontal intraparenchymal hemorrhage with mass-effect.  Today's Discussion (02/07/2021):  *Please note that this is a verbal dictation therefore any spelling or grammatical errors are due to the "[place] One" system interpretation.  Chart reviewed.  I met with Carmen [name], patients daughter at bedside. She shares concerns that her mother may have increased pain based upon some of her physical indicators earlier. We reviewed the idea of instituting pain management with opioids and agitation management with ativan in an  effort to reduce and distress or suffering.   At this time Carmen [name] shares that we are only continuing present measures for another day or so. Likely tomorrow or even after meeting with neurology today the goals will be to shift to a full comfort oriented emphasis of care.  I spoke to patients RN, Carmen [name] regarding the plan from a symptom management perspective.   Questions and concerns addressed   Objective Assessment: Vital Signs Vitals:   02/07/21 1300 02/07/21 1400  BP: 111/74 94/68  Pulse: (!) 114 (!) 106  Resp: (!) 27 (!) 23  Temp:    SpO2: 92% 94%    Intake/Output Summary (Last 24 hours) at 02/07/2021 1501 Last data filed at 02/07/2021 1400 Gross per 24 hour  Intake 835.32 ml  Output 1875 ml  Net -1039.68 ml   Last Weight  Most recent update: 02/06/2021  5:24 AM    Weight  56.3 kg (124 lb 1.9 oz)            Gen: Very frail elderly female in no acute distress HEENT: Dry mucous membranes CV: Regular rate and rhythm PULM: On 2 L nasal cannula ABD: soft/nontender EXT: No edema Neuro: Somnolent, unable to arouse  SUMMARY OF RECOMMENDATIONS   DNAR/DNI   Plan for modified comfort care - no additional diagnostic tests with the exception of sodium draws, no placement of a nasogastric tube  Patients daughter, Carmen [name] asked for better pain control reviewed the medications used could cause more somnolence which she is accepting of - initiated low dose morphine and ativan PRN   For now patient's family would like to continue with hypertonic saline and intravenous Keppra -  Plan to reassess on Sunday if no improvements are seen we will proceed with full comfort oriented care   Ongoing palliative care support for patient and her family during hospitalization  Time Spent: 30 Greater than 50% of the time was spent in counseling and coordination of care ______________________________________________________________________________________ [place] Team Team Cell Phone: [phone] Please utilize secure chat with additional questions, if there is no response within 30 minutes please call the above phone number  Palliative Medicine Team providers are available by phone from 7am to 7pm daily and can be reached through the team cell phone.  Should this patient require assistance outside of these hours, please call the patient's attending physician.

## 2021-02-07 NOTE — Progress Notes (Addendum)
Instructed by Nilda Riggs to turn 3% hypertonic saline down to 15ml/hr due to last sodium check of 160

## 2021-02-07 NOTE — Progress Notes (Signed)
Noted patient's new irregular HR, EKG was preformed. EKG image in results. Will pass on to day shift RN.

## 2021-02-08 DIAGNOSIS — Z7189 Other specified counseling: Secondary | ICD-10-CM | POA: Diagnosis not present

## 2021-02-08 DIAGNOSIS — Z515 Encounter for palliative care: Secondary | ICD-10-CM | POA: Diagnosis not present

## 2021-02-08 LAB — SODIUM: Sodium: 165 mmol/L (ref 135–145)

## 2021-02-08 MED ORDER — MORPHINE 100MG IN NS 100ML (1MG/ML) PREMIX INFUSION
1.0000 mg/h | INTRAVENOUS | Status: DC
  Administered 2021-02-08: 2 mg/h via INTRAVENOUS
  Administered 2021-02-09 (×2): 7 mg/h via INTRAVENOUS
  Filled 2021-02-08 (×3): qty 100

## 2021-02-08 MED ORDER — MORPHINE BOLUS VIA INFUSION
1.0000 mg | INTRAVENOUS | Status: DC | PRN
Start: 2021-02-08 — End: 2021-02-10
  Administered 2021-02-08 (×5): 1 mg via INTRAVENOUS
  Filled 2021-02-08: qty 1

## 2021-02-08 NOTE — Progress Notes (Addendum)
Palliative Medicine Inpatient Follow Up Note   Consulting Provider: Ellene Route, RN   Reason for consult:   [employer] Palliative Medicine Consult  Reason for Consult? Poor prognosis    HPI:  Per intake H&P --> Carmen [name] is a 81 y.o. female who is BIB EMS to [facility] after a witnessed mechanical fall. She has a PmHx significant for dementia with cerebral amyloid angiopathy, anxiety, depression, osteopenia, and HLD. Denies any anticoagulation use. Patient is a resident at [facility]. She was reported to have tripped and fell around 6:00 pm today. Patient became agitated, facility gave her Ativan. Unsure if administered prior to or after the fall. Facility reports patient at her baseline following fall. At baseline, she is conversational however turns to nonsensical speech. Per her son, the patient is typically lethargic in the evenings after receiving her evening meds. Her fall was was about 20 minutes after her med admin. Ambulatory at baseline. CTH while in the ED revealed a large right frontal IPH with mass effect and MLS, as well as a small left parietal SAH. Neurosurgery consult was requested. NSX requested patient to be transferred to [facility] for further neurological observation and potential intervention.    Palliative care was asked to get involved in the setting of a poor prognosis due to a large right frontal intraparenchymal hemorrhage with mass-effect.  Today's Discussion (02/08/2021):  *Please note that this is a verbal dictation therefore any spelling or grammatical errors are due to the "[place] One" system interpretation.  Chart reviewed.  I was paged this morning by patients RN, Wannetta Sender. We reviewed that patients family is now at the point whereby they are in agreement with full comfort mediated care. I was able to speak to patients daughter, Carmen [name] over the phone to confirm this. We reviewed that Astha was  having come worsening distress. I shared that to aid in this we will initiate a morphine drip which she was in agreement with.  Later in the morning, I met at bedside with patients daughter, Carmen [name] and son, Marya Amsler. We reviewed the plan for care and symptom relief. They asked how much time Carmen [name] had and it appears that presently she in breathing comfortably without any apnea episodes. They asked if their sister [name] will make it to see her. We reviewed that this is unpredictable as everyone is different in terms of their time lines. They are understanding of this.   Questions and concerns addressed   Palliative support provided.  Objective Assessment: Vital Signs Vitals:   02/08/21 0600 02/08/21 0700  BP: 115/69 (!) 149/86  Pulse: 87 (!) 115  Resp: (!) 22 (!) 31  Temp:    SpO2: 91% (!) 82%    Intake/Output Summary (Last 24 hours) at 02/08/2021 9678 Last data filed at 02/08/2021 0600 Gross per 24 hour  Intake 543.38 ml  Output 250 ml  Net 293.38 ml    Last Weight  Most recent update: 02/06/2021  5:24 AM    Weight  56.3 kg (124 lb 1.9 oz)            Gen: Very frail elderly female in no acute distress HEENT: Dry mucous membranes CV: Irregular rate and rhythm PULM: On 2 L nasal cannula ABD: soft/nontender EXT: No edema Neuro: Somnolent, unable to arouse  SUMMARY OF RECOMMENDATIONS   DNAR/DNI   Comfort care - Initiate morphine gtt for symptom management  Wean O2 as tolerated  Unrestricted Visitation  Anticipate  death within hours to days  Ongoing palliative support  _______________________________________________________________________ Billing:  MDM - High --> S/P fall with IPH & Mass effect in the neuro ICU. Ongoing conversations with patients family regarding goals of care - today transition made to full comfort care. Initiated low dose morphine gtt at aid in symptom burden.  ________________________________________________________________________ [place] Team Team Cell Phone: [phone] Please utilize secure chat with additional questions, if there is no response within 30 minutes please call the above phone number  Palliative Medicine Team providers are available by phone from 7am to 7pm daily and can be reached through the team cell phone.  Should this patient require assistance outside of these hours, please call the patient's attending physician.

## 2021-02-08 NOTE — Progress Notes (Signed)
Spoke with Katy Apo from [facility]. Referral # [phone] [facility] to be notified with cardiac TOD.

## 2021-02-08 NOTE — Plan of Care (Signed)
°  Problem: Education: Goal: Knowledge of the prescribed therapeutic regimen will improve Outcome: Progressing   Problem: Coping: Goal: Ability to identify and develop effective coping behavior will improve Outcome: Progressing   Problem: Role Relationship: Goal: Family's ability to cope with current situation will improve Outcome: Progressing   Problem: Pain Management: Goal: Satisfaction with pain management regimen will improve Outcome: Progressing

## 2021-02-08 NOTE — Progress Notes (Signed)
Patient transitioned to comfort care at request of family earlier this morning.  Currently unresponsive, with agonal respirations, on morphine gtt.  Appears likely to expire over next few hours.

## 2021-02-08 DEATH — deceased

## 2021-02-09 DIAGNOSIS — Z515 Encounter for palliative care: Secondary | ICD-10-CM | POA: Diagnosis not present

## 2021-02-09 DIAGNOSIS — S0990XA Unspecified injury of head, initial encounter: Secondary | ICD-10-CM

## 2021-02-09 DIAGNOSIS — S06335A Contusion and laceration of cerebrum, unspecified, with loss of consciousness greater than 24 hours with return to pre-existing conscious level, initial encounter: Secondary | ICD-10-CM | POA: Diagnosis not present

## 2021-03-11 NOTE — Progress Notes (Signed)
Patient comfort care status. These are morning shift vitals just for record.  2021/02/26 0737  Vitals  BP (!) 55/41  MAP (mmHg) (!) 47  BP Location Left Arm  BP Method Automatic  Patient Position (if appropriate) Lying  Pulse Rate 96  Pulse Rate Source Monitor  ECG Heart Rate 97  Resp (!) 9  Oxygen Therapy  SpO2 91 %  O2 Device Nasal Cannula  O2 Flow Rate (L/min) 2 L/min  MEWS Score  MEWS Temp 0  MEWS Systolic 3  MEWS Pulse 0  MEWS RR 1  MEWS LOC 2  MEWS Score 6  MEWS Score Color Comfort Care Only   Montez Hageman, RN

## 2021-03-11 NOTE — Discharge Summary (Signed)
°  Physician Discharge Summary  Patient ID: Carmen [name] MRN: [id number] DOB/AGE: December 09, 1940 81 y.o.  Admit date: 02/02/2021 Discharge date: 02/19/2021  Admission Diagnoses:  Traumatic intraparenchymal hemorrhage with mass-effect  Discharge Diagnoses:  Same Principal Problem:   Intraparenchymal hematoma of brain   Discharged Condition: Stable  Hospital Course:  Carmen [name] is a 81 y.o. female presented to the hospital after a fall and was found to have a large right frontal intraparenchymal hemorrhage with mass-effect, likely compounded by amyloid angiopathy and Plavix.  She was placed in the ICU and treated medically with hypertonic saline and close neuro monitoring.  Unfortunately she continued to progress and decline neurologically, surgical intervention was discussed with the family and they stated the patient would not want any aggressive surgical intervention and therefore palliative care was consulted.  Ultimately the patient continued to decline and comfort measures for performed.  The patient ultimately expired with comfort measures at 1645 on 03-03-21.  Treatments: Medical management, ICU care, palliative care-comfort measures  Discharge Exam: Deceased  Disposition:   Deceased       Signed: Theodoro Doing Carmen [name] 02/19/2021, 9:33 AM

## 2021-03-11 NOTE — Progress Notes (Signed)
Neurosurgery Service Progress Note  Subjective: No acute events overnight   Objective: Vitals:   02/08/21 1330 Mar 04, 2021 0700 03/04/21 0737 Mar 04, 2021 0800  BP: (!) 74/58  (!) 55/41   Pulse: (!) 143 91 96 94  Resp: 13 (!) 9 (!) 9 11  Temp:      TempSrc:      SpO2: (!) 82% 91% 91% 92%  Weight:      Height:        Physical Exam: Unresponsive, pupils nonreactive, +c/c/g, no response to painful stim, airway patent w/ good control of secretions  Assessment & Plan: 81 y.o. woman s/p fall & intracranial hemorrhage.  -transfer to 5N or 6N -cont morphine gtt, pal care recs  Judith Part  03/04/2021 9:20 AM

## 2021-03-11 NOTE — Progress Notes (Signed)
Daily Progress Note   Patient Name: Carmen [name]       Date: 03/02/2021 DOB: 1940-03-29  Age: 81 y.o. MRN#: [id number] Attending Physician: Karsten Ro, DO Primary Care Physician: Virgie Dad, MD Admit Date: 01/27/2021  Reason for Consultation/Follow-up: Establishing goals of care and Terminal Care  Patient Profile/HPI:   Carmen [name] is a 81 y.o. female who is BIB EMS to [facility] after a witnessed mechanical fall. She has a PmHx significant for dementia with cerebral amyloid angiopathy, anxiety, depression, osteopenia, and HLD. Denies any anticoagulation use. Patient is a resident at [facility]. She was reported to have tripped and fell around 6:00 pm today. Patient became agitated, facility gave her Ativan. Unsure if administered prior to or after the fall. Facility reports patient at her baseline following fall. At baseline, she is conversational however turns to nonsensical speech. Per her son, the patient is typically lethargic in the evenings after receiving her evening meds. Her fall was was about 20 minutes after her med admin. Ambulatory at baseline. CTH while in the ED revealed a large right frontal IPH with mass effect and MLS, as well as a small left parietal SAH. Neurosurgery consult was requested. NSX requested patient to be transferred to [facility] for further neurological observation and potential intervention.    Palliative care was asked to get involved in the setting of a poor prognosis due to a large right frontal intraparenchymal hemorrhage with mass-effect.  She has been transitioned to full comfort measures only and is actively dying.   Subjective: Evaluated patient- she is actively dying.  Family at bedside.  Discussed management of  symptoms of dying utilizing morphine infusion. She continues on 6-7 liters of oxygen- at this point the oxygen is not contributing to her comfort and is prolonging her dying process. Offered option of d/c oxygen and use morphine to relieve any signs of discomfort- family is in agreement- their goals of care are not to continue to prolong her dying, only maintain her comfort as she transitions to end of life.  They inquired about estimated timeline- discussed based on observations that best guess is likely hours-days. We discussed at this point in dying process it becomes very individual and some patients progress quickly through end of life, and some patients linger, however, will  monitor and continue to keep her as comfortable as possible.   Review of Systems  Unable to perform ROS: Acuity of condition    Physical Exam Vitals and nursing note reviewed.  Constitutional:      Comments: Actively dying  Cardiovascular:     Comments: Peripheral pulses weak Pulmonary:     Comments: Unlabored, shallow, periods of apnea Musculoskeletal:     Comments: Loss of jaw muscle control, nasolabial folds drooping  Neurological:     Comments: unresponsive            Vital Signs: BP (!) 55/41 (BP Location: Left Arm)    Pulse 95    Temp 98.6 F (37 C) (Axillary)    Resp 17    Ht 5\' 1"  (1.549 m)    Wt 56.3 kg    SpO2 93%    BMI 23.45 kg/m  SpO2: SpO2: 93 % O2 Device: O2 Device: Nasal Cannula O2 Flow Rate: O2 Flow Rate (L/min): 5 L/min  Intake/output summary:  Intake/Output Summary (Last 24 hours) at 19-Feb-2021 1519 Last data filed at Feb 19, 2021 1100 Gross per 24 hour  Intake 132.08 ml  Output 0 ml  Net 132.08 ml   LBM: Last BM Date: 02/04/21 Baseline Weight: Weight: 59.1 kg Most recent weight: Weight: 56.3 kg       Palliative Assessment/Data: PPS: 10%      Patient Active Problem List   Diagnosis Date Noted   Intraparenchymal hematoma of brain 02/04/2021   Constipation 11/14/2020   PBA  (pseudobulbar affect) 11/14/2020   Acute ischemic stroke ([place]) 02/17/2020   Major neurocognitive disorder, due to another medical condition, without behavioral disturbance, moderate 03/03/2019   Panic attacks 12/09/2018   Cerebral amyloid angiopathy (CODE) 12/09/2018   OSA on CPAP 12/09/2018   DNR (do not resuscitate) 12/09/2018   Situational anxiety 03/07/2018   Pernicious anemia 04/19/2017   Situational depression 04/27/2016   Contracture of palmar fascia 07/23/2015   History of malignant melanoma of skin 02/12/2014   Cataract 10/23/2013   Family history of breast cancer 10/23/2013   Fuchs' corneal dystrophy 10/23/2013   Glaucoma 10/23/2013   Mixed hyperlipidemia 03/21/2013   Rhytides 12/12/2012    Palliative Care Assessment & Plan    Assessment/Recommendations/Plan  Actively dying- goal is comfort, continue morphine at current rate, discussed with nursing to utilize bolus doses for any signs of discomfort before increasing rate D/C nasal cannula oxygen- do not check oxygen saturation, use morphine for any signs of air hunger or discomfort Continue all other comfort medications as ordered   Code Status: DNR  Prognosis:  Hours - Days  Discharge Planning: Anticipated Hospital Death  Care plan was discussed with patient's family and care team.   Thank you for allowing the Palliative Medicine Team to assist in the care of this patient.  Mariana Kaufman, AGNP-C Palliative Medicine   Please contact Palliative Medicine Team phone at [phone] for questions and concerns.

## 2021-03-11 NOTE — Progress Notes (Signed)
Nutrition Brief Note ° °Chart reviewed. °Pt now transitioning to comfort care.  °No further nutrition interventions planned at this time.  °Please re-consult as needed. ° ° °Kate Flossie Wexler, MS, RD, LDN °Inpatient Clinical Dietitian °Please see [facility] for contact information. ° ° °

## 2021-03-11 NOTE — Progress Notes (Signed)
[place] into the room that patient has stopped breathing. DNR on comfort measures.  Patient with no palpable carotid pulse. No respirations observed. Pupils fixed and dilated. Same confirmed by Rolene Arbour AD. Certified dead at 4562. Family by bedside. Support offered. Declined chaplain services. Mariana Kaufman NP informed via secure chat. Paged neurosurgery via answering services with message that "patient has expired at 1645".

## 2021-03-11 DEATH — deceased

## 2021-06-01 ENCOUNTER — Ambulatory Visit: Payer: Medicare Other | Admitting: Adult Health

## 2022-07-26 IMAGING — CT CT HEAD W/O CM
4 of 7 series · 18 of 47 positions shown, 19 images · non-contrast
Comparison: Head CT from yesterday

CLINICAL DATA: Follow-up subarachnoid hemorrhage

EXAM:
CT HEAD WITHOUT CONTRAST
TECHNIQUE: Contiguous axial images were obtained from the base of the skull
through the vertex without intravenous contrast.

[Series 4: head without · axial · non-contrast · 0.42mm/px · z∈[+101,+211]mm · 4 of 38 slices shown, 5 images]
[im 8/38  brain]
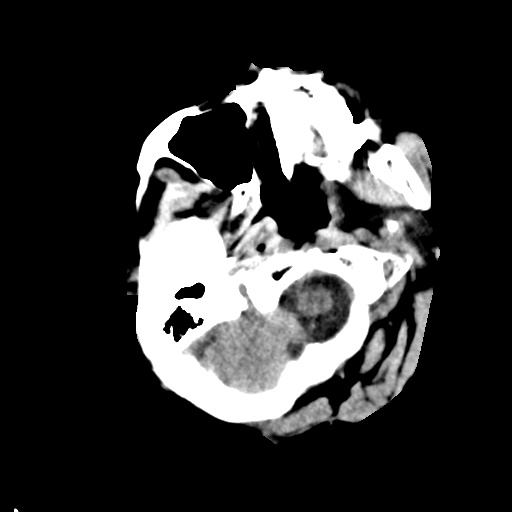
[im 8/38  bone]
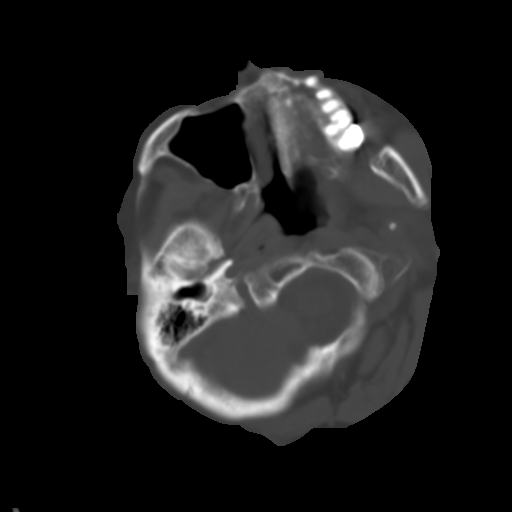
[im 15/38  brain]
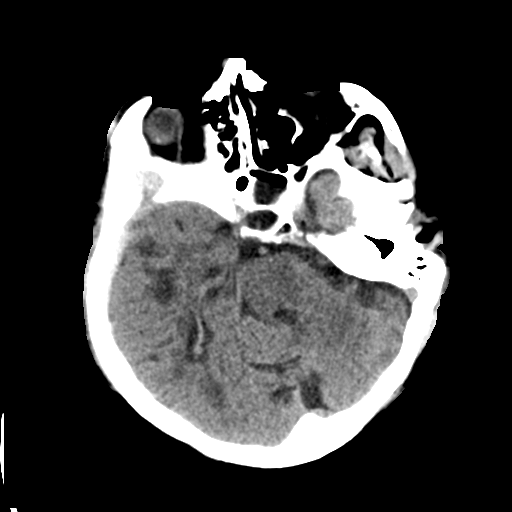
[im 23/38  brain]
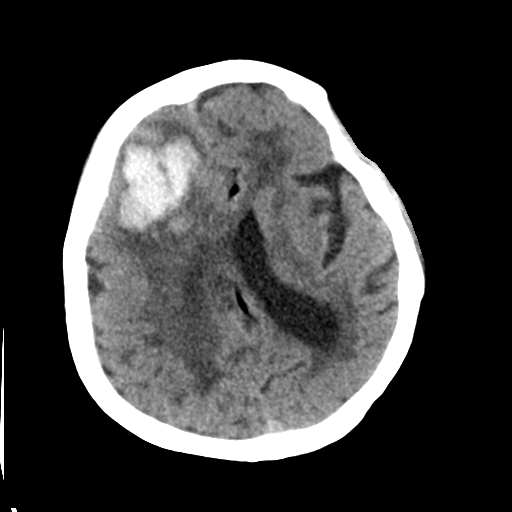
[im 30/38  brain]
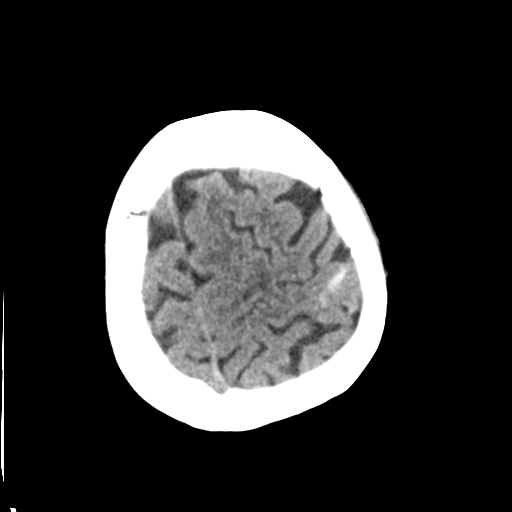

[Series 7: head without ax straight · axial · non-contrast · 0.42mm/px · z∈[+106,+230]mm · 8 of 62 slices shown]
[im 7/62  brain]
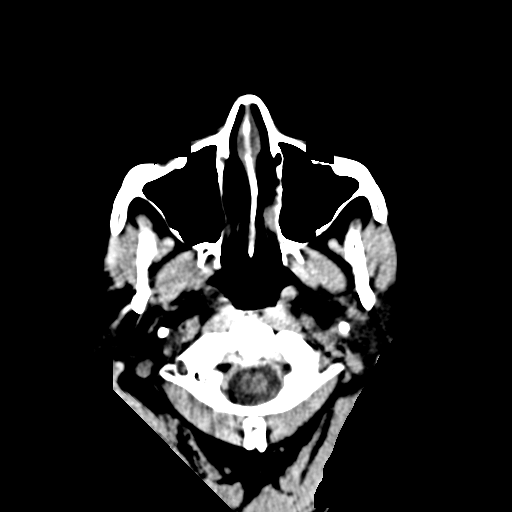
[im 14/62  brain]
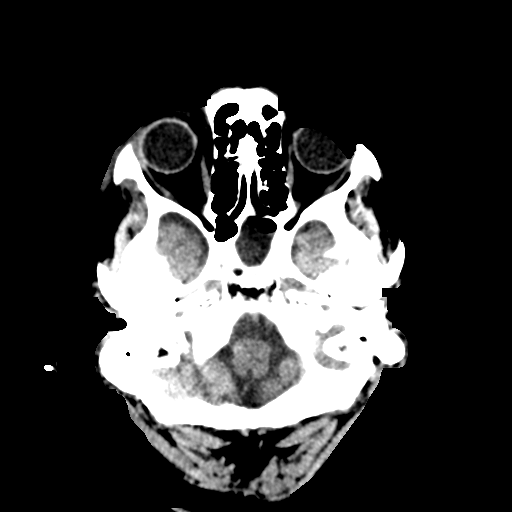
[im 21/62  brain]
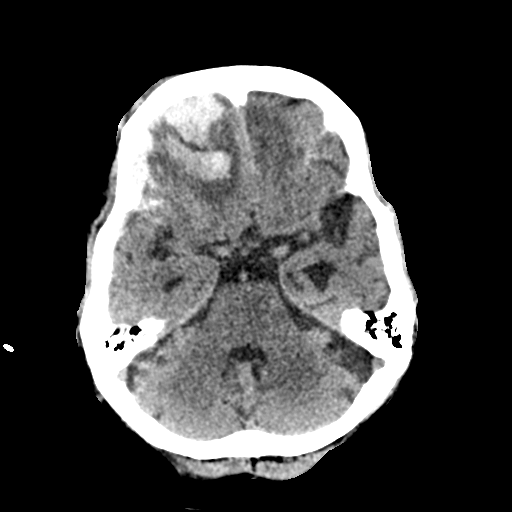
[im 28/62  brain]
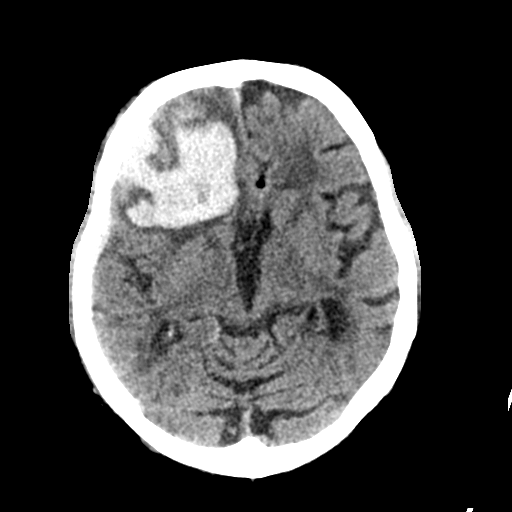
[im 34/62  brain]
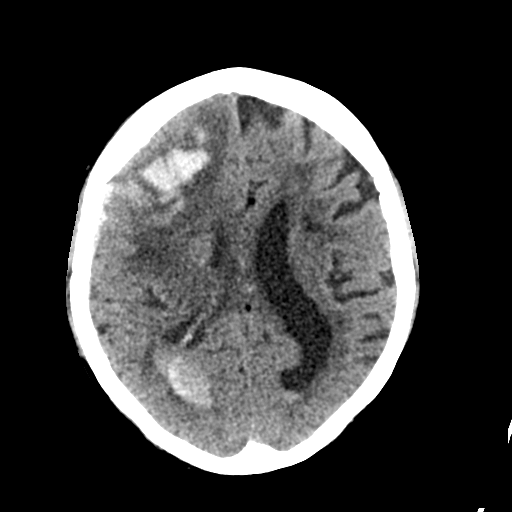
[im 41/62  brain]
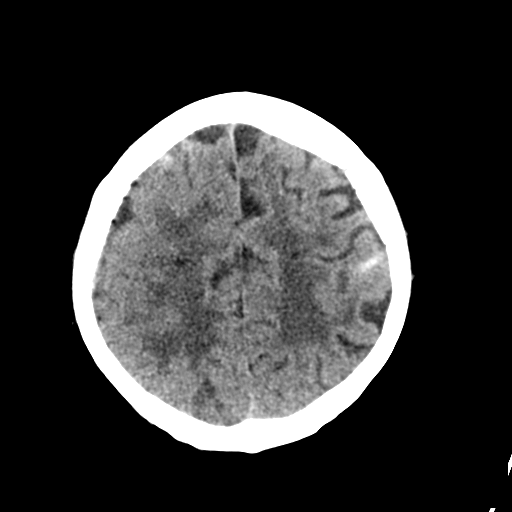
[im 48/62  brain]
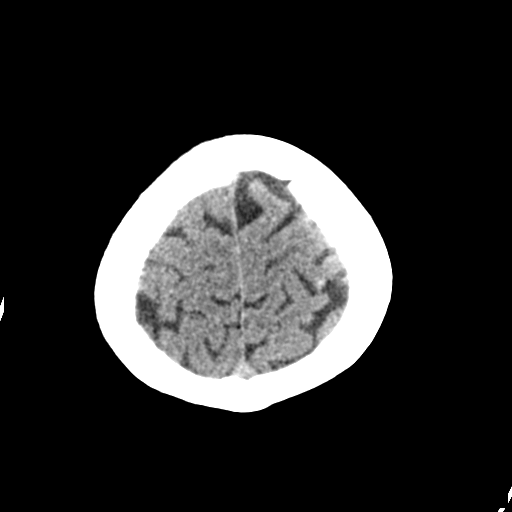
[im 55/62  brain]
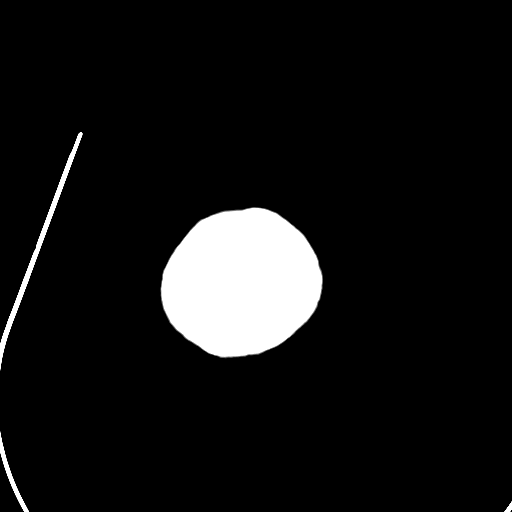

[Series 8: head without cor straight · coronal · non-contrast · 0.41mm/px · 3 of 85 slices shown]
[im 28/85  brain]
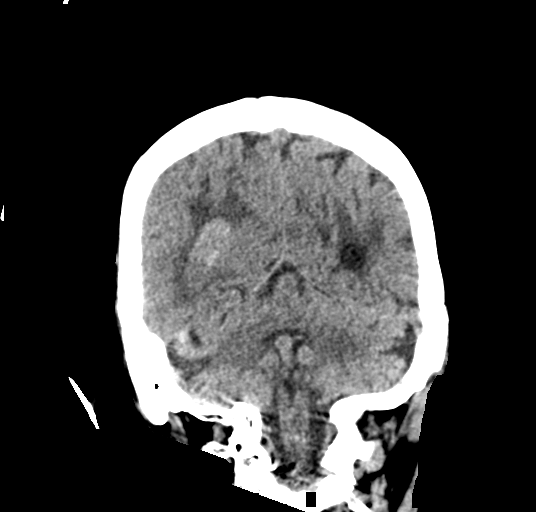
[im 41/85  brain]
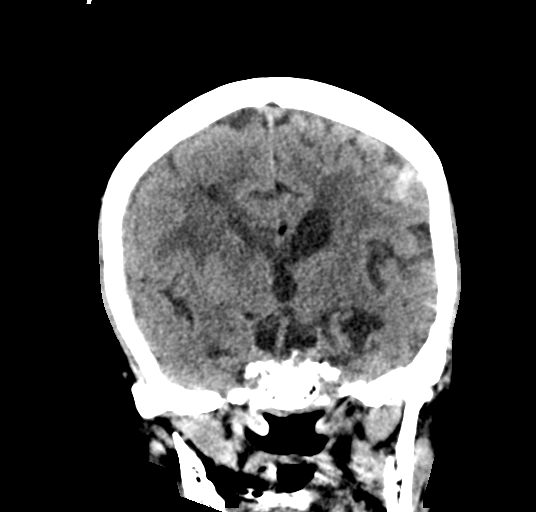
[im 55/85  brain]
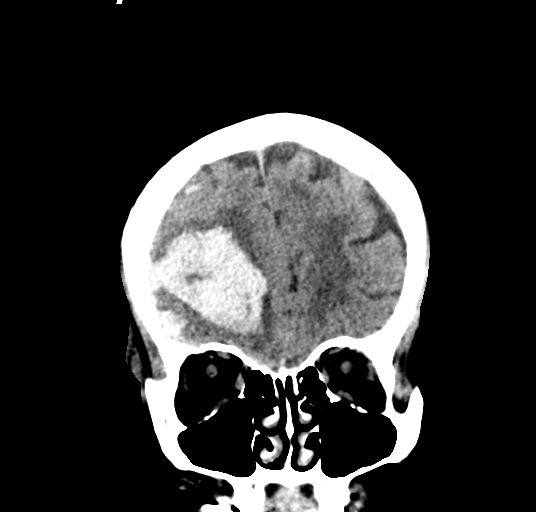

[Series 9: head without sag straight · sagittal · non-contrast · 0.44mm/px · 3 of 62 slices shown]
[im 25/62  brain]
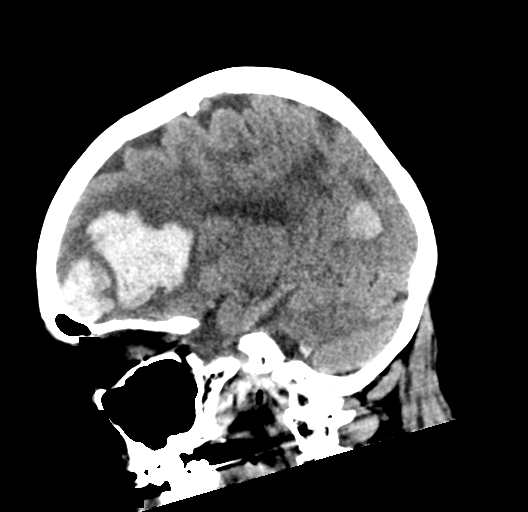
[im 31/62  brain]
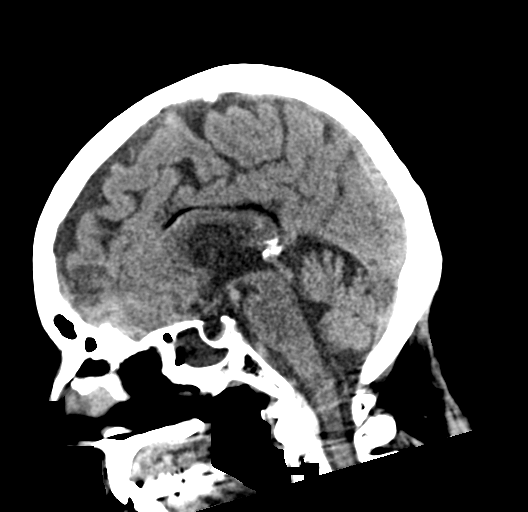
[im 37/62  brain]
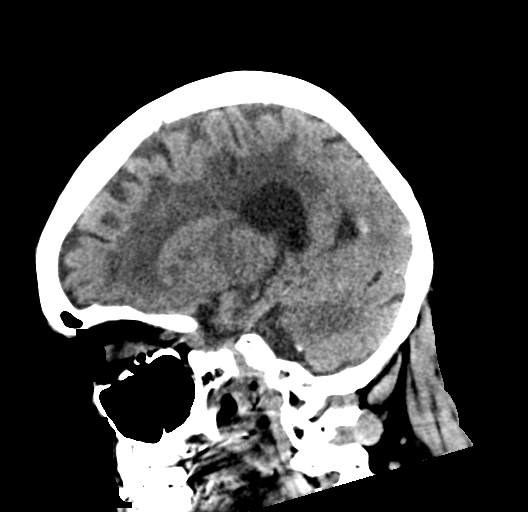

[18 of 47 positions shown; findings below may reference images not displayed]

FINDINGS: Brain: Large right frontal parenchymal hemorrhage with rim of edema,
hematoma measuring up to 7.2 x 4.4 cm, mildly increased from before.
Associated vasogenic edema. Local mass effect with midline shift
measuring 6 mm at the anterior interhemispheric fissure, progressed.
There is now blood clot layering in the occipital horns of the
lateral ventricles which could be intraventricular extension or
subarachnoid redistribution. Small volume subarachnoid hemorrhage at
the high left cerebral convexity is unchanged. Inter hemispheric
lipoma, incidental. Confluent chronic small vessel ischemia.

Vascular: No hyperdense vessel or unexpected calcification.

Skull: Negative

Sinuses/Orbits: Left sphenoid sinus opacification.
IMPRESSION: 1. Large right frontal parenchymal hematoma with mildly increased
dimensions and increased adjacent swelling. Anterior inter
hemispheric shift measures 6 mm.
2. New intraventricular blood clot without hydrocephalus. Unchanged
small volume subarachnoid hemorrhage at the vertex.
3. Advanced chronic small vessel disease.
